# Patient Record
Sex: Male | Born: 1940 | Race: White | Hispanic: No | Marital: Married | Smoking: Never smoker
Health system: Southern US, Community
[De-identification: ages and names within clinical notes are randomized; demographics above are authoritative.]

## PROBLEM LIST (undated history)

## (undated) DIAGNOSIS — I82409 Acute embolism and thrombosis of unspecified deep veins of unspecified lower extremity: Secondary | ICD-10-CM

## (undated) DIAGNOSIS — G4733 Obstructive sleep apnea (adult) (pediatric): Secondary | ICD-10-CM

## (undated) DIAGNOSIS — I10 Essential (primary) hypertension: Secondary | ICD-10-CM

## (undated) DIAGNOSIS — G4730 Sleep apnea, unspecified: Secondary | ICD-10-CM

## (undated) DIAGNOSIS — H269 Unspecified cataract: Secondary | ICD-10-CM

## (undated) DIAGNOSIS — T7840XA Allergy, unspecified, initial encounter: Secondary | ICD-10-CM

## (undated) DIAGNOSIS — E559 Vitamin D deficiency, unspecified: Secondary | ICD-10-CM

## (undated) DIAGNOSIS — N3020 Other chronic cystitis without hematuria: Secondary | ICD-10-CM

## (undated) DIAGNOSIS — R011 Cardiac murmur, unspecified: Secondary | ICD-10-CM

## (undated) DIAGNOSIS — E119 Type 2 diabetes mellitus without complications: Secondary | ICD-10-CM

## (undated) DIAGNOSIS — L570 Actinic keratosis: Secondary | ICD-10-CM

## (undated) DIAGNOSIS — K429 Umbilical hernia without obstruction or gangrene: Secondary | ICD-10-CM

## (undated) DIAGNOSIS — M1990 Unspecified osteoarthritis, unspecified site: Secondary | ICD-10-CM

## (undated) DIAGNOSIS — N400 Enlarged prostate without lower urinary tract symptoms: Secondary | ICD-10-CM

## (undated) DIAGNOSIS — N390 Urinary tract infection, site not specified: Secondary | ICD-10-CM

## (undated) HISTORY — DX: Unspecified cataract: H26.9

## (undated) HISTORY — DX: Sleep apnea, unspecified: G47.30

## (undated) HISTORY — DX: Other chronic cystitis without hematuria: N30.20

## (undated) HISTORY — DX: Benign prostatic hyperplasia without lower urinary tract symptoms: N40.0

## (undated) HISTORY — DX: Vitamin D deficiency, unspecified: E55.9

## (undated) HISTORY — DX: Umbilical hernia without obstruction or gangrene: K42.9

## (undated) HISTORY — DX: Essential (primary) hypertension: I10

## (undated) HISTORY — DX: Obstructive sleep apnea (adult) (pediatric): G47.33

## (undated) HISTORY — DX: Allergy, unspecified, initial encounter: T78.40XA

## (undated) HISTORY — DX: Type 2 diabetes mellitus without complications: E11.9

## (undated) HISTORY — DX: Urinary tract infection, site not specified: N39.0

## (undated) HISTORY — DX: Unspecified osteoarthritis, unspecified site: M19.90

## (undated) HISTORY — DX: Cardiac murmur, unspecified: R01.1

## (undated) HISTORY — DX: Actinic keratosis: L57.0

## (undated) HISTORY — DX: Acute embolism and thrombosis of unspecified deep veins of unspecified lower extremity: I82.409

---

## 1999-04-15 ENCOUNTER — Ambulatory Visit: Admission: RE | Admit: 1999-04-15 | Discharge: 1999-04-15 | Payer: Self-pay | Admitting: Family Medicine

## 2001-06-29 ENCOUNTER — Encounter: Payer: Self-pay | Admitting: Family Medicine

## 2001-06-29 ENCOUNTER — Encounter: Admission: RE | Admit: 2001-06-29 | Discharge: 2001-06-29 | Payer: Self-pay | Admitting: Family Medicine

## 2001-09-07 HISTORY — PX: PROSTATE SURGERY: SHX751

## 2001-10-08 LAB — HM COLONOSCOPY: HM Colonoscopy: NORMAL

## 2001-11-15 ENCOUNTER — Emergency Department (HOSPITAL_COMMUNITY): Admission: EM | Admit: 2001-11-15 | Discharge: 2001-11-15 | Payer: Self-pay | Admitting: Emergency Medicine

## 2001-11-18 ENCOUNTER — Emergency Department (HOSPITAL_COMMUNITY): Admission: EM | Admit: 2001-11-18 | Discharge: 2001-11-18 | Payer: Self-pay | Admitting: Emergency Medicine

## 2001-11-21 ENCOUNTER — Encounter: Payer: Self-pay | Admitting: Urology

## 2001-11-21 ENCOUNTER — Encounter: Admission: RE | Admit: 2001-11-21 | Discharge: 2001-11-21 | Payer: Self-pay | Admitting: Urology

## 2003-09-08 DIAGNOSIS — I82409 Acute embolism and thrombosis of unspecified deep veins of unspecified lower extremity: Secondary | ICD-10-CM

## 2003-09-08 HISTORY — DX: Acute embolism and thrombosis of unspecified deep veins of unspecified lower extremity: I82.409

## 2003-10-01 ENCOUNTER — Encounter: Admission: RE | Admit: 2003-10-01 | Discharge: 2003-10-01 | Payer: Self-pay | Admitting: Family Medicine

## 2003-10-12 ENCOUNTER — Inpatient Hospital Stay (HOSPITAL_COMMUNITY): Admission: EM | Admit: 2003-10-12 | Discharge: 2003-10-14 | Payer: Self-pay | Admitting: Emergency Medicine

## 2003-10-12 ENCOUNTER — Encounter: Admission: RE | Admit: 2003-10-12 | Discharge: 2003-10-12 | Payer: Self-pay | Admitting: Family Medicine

## 2006-05-13 ENCOUNTER — Ambulatory Visit: Payer: Self-pay | Admitting: Family Medicine

## 2006-07-05 ENCOUNTER — Ambulatory Visit: Payer: Self-pay | Admitting: Family Medicine

## 2006-07-21 ENCOUNTER — Ambulatory Visit: Payer: Self-pay | Admitting: Family Medicine

## 2006-10-18 ENCOUNTER — Ambulatory Visit: Payer: Self-pay | Admitting: Family Medicine

## 2006-10-18 LAB — CONVERTED CEMR LAB
BUN: 18 mg/dL (ref 6–23)
CO2: 32 meq/L (ref 19–32)
Calcium: 9.5 mg/dL (ref 8.4–10.5)
Chloride: 98 meq/L (ref 96–112)
Creatinine, Ser: 1.1 mg/dL (ref 0.4–1.5)
GFR calc Af Amer: 86 mL/min
GFR calc non Af Amer: 71 mL/min
Glucose, Bld: 82 mg/dL (ref 70–99)
Potassium: 4.4 meq/L (ref 3.5–5.1)
Sodium: 137 meq/L (ref 135–145)

## 2007-02-21 ENCOUNTER — Ambulatory Visit: Payer: Self-pay | Admitting: Family Medicine

## 2007-02-21 DIAGNOSIS — J309 Allergic rhinitis, unspecified: Secondary | ICD-10-CM | POA: Insufficient documentation

## 2007-02-21 DIAGNOSIS — I10 Essential (primary) hypertension: Secondary | ICD-10-CM | POA: Insufficient documentation

## 2007-02-21 LAB — CONVERTED CEMR LAB
CO2: 30 meq/L (ref 19–32)
Chloride: 107 meq/L (ref 96–112)
Cholesterol: 181 mg/dL (ref 0–200)
Creatinine, Ser: 1 mg/dL (ref 0.4–1.5)
Glucose, Bld: 85 mg/dL (ref 70–99)
HDL: 29.6 mg/dL — ABNORMAL LOW (ref >39.0)
Sodium: 141 meq/L (ref 135–145)
VLDL: 37 mg/dL (ref 0–40)

## 2007-02-22 ENCOUNTER — Telehealth (INDEPENDENT_AMBULATORY_CARE_PROVIDER_SITE_OTHER): Payer: Self-pay | Admitting: *Deleted

## 2007-05-16 ENCOUNTER — Ambulatory Visit: Payer: Self-pay | Admitting: Family Medicine

## 2007-05-19 ENCOUNTER — Encounter (INDEPENDENT_AMBULATORY_CARE_PROVIDER_SITE_OTHER): Payer: Self-pay | Admitting: Family Medicine

## 2007-05-19 LAB — CONVERTED CEMR LAB
BUN: 21 mg/dL (ref 6–23)
Chloride: 107 meq/L (ref 96–112)
Cholesterol: 135 mg/dL (ref 0–200)
Direct LDL: 71.1 mg/dL
GFR calc non Af Amer: 64 mL/min
Sodium: 143 meq/L (ref 135–145)
Total CHOL/HDL Ratio: 5.4
VLDL: 51 mg/dL — ABNORMAL HIGH (ref 0–40)

## 2007-07-28 ENCOUNTER — Ambulatory Visit: Payer: Self-pay | Admitting: Family Medicine

## 2007-09-16 ENCOUNTER — Ambulatory Visit: Payer: Self-pay | Admitting: Family Medicine

## 2007-09-16 LAB — CONVERTED CEMR LAB
Chloride: 102 meq/L (ref 96–112)
GFR calc non Af Amer: 71 mL/min
Potassium: 4.2 meq/L (ref 3.5–5.1)
Sodium: 138 meq/L (ref 135–145)

## 2007-09-19 ENCOUNTER — Encounter (INDEPENDENT_AMBULATORY_CARE_PROVIDER_SITE_OTHER): Payer: Self-pay | Admitting: *Deleted

## 2007-10-17 ENCOUNTER — Ambulatory Visit: Payer: Self-pay | Admitting: Family Medicine

## 2007-11-01 ENCOUNTER — Ambulatory Visit: Payer: Self-pay | Admitting: Family Medicine

## 2007-12-14 ENCOUNTER — Ambulatory Visit: Payer: Self-pay | Admitting: Internal Medicine

## 2008-03-07 ENCOUNTER — Telehealth: Payer: Self-pay | Admitting: Internal Medicine

## 2008-03-07 DIAGNOSIS — G4733 Obstructive sleep apnea (adult) (pediatric): Secondary | ICD-10-CM | POA: Insufficient documentation

## 2008-03-12 ENCOUNTER — Encounter: Payer: Self-pay | Admitting: Internal Medicine

## 2008-04-16 ENCOUNTER — Encounter: Payer: Self-pay | Admitting: Internal Medicine

## 2008-05-18 ENCOUNTER — Ambulatory Visit: Payer: Self-pay | Admitting: Internal Medicine

## 2008-05-22 ENCOUNTER — Telehealth: Payer: Self-pay | Admitting: Internal Medicine

## 2008-05-22 ENCOUNTER — Telehealth (INDEPENDENT_AMBULATORY_CARE_PROVIDER_SITE_OTHER): Payer: Self-pay | Admitting: *Deleted

## 2008-06-05 ENCOUNTER — Ambulatory Visit: Payer: Self-pay | Admitting: Pulmonary Disease

## 2008-08-11 ENCOUNTER — Encounter: Payer: Self-pay | Admitting: Pulmonary Disease

## 2008-08-21 ENCOUNTER — Ambulatory Visit: Payer: Self-pay | Admitting: Pulmonary Disease

## 2008-08-29 ENCOUNTER — Encounter: Payer: Self-pay | Admitting: Internal Medicine

## 2008-09-17 ENCOUNTER — Encounter: Payer: Self-pay | Admitting: Pulmonary Disease

## 2008-10-08 ENCOUNTER — Telehealth: Payer: Self-pay | Admitting: Internal Medicine

## 2008-10-17 ENCOUNTER — Telehealth (INDEPENDENT_AMBULATORY_CARE_PROVIDER_SITE_OTHER): Payer: Self-pay | Admitting: *Deleted

## 2008-11-16 ENCOUNTER — Ambulatory Visit: Payer: Self-pay | Admitting: Internal Medicine

## 2008-11-16 DIAGNOSIS — E785 Hyperlipidemia, unspecified: Secondary | ICD-10-CM | POA: Insufficient documentation

## 2008-11-16 DIAGNOSIS — E781 Pure hyperglyceridemia: Secondary | ICD-10-CM | POA: Insufficient documentation

## 2008-11-17 LAB — CONVERTED CEMR LAB
Cholesterol: 151 mg/dL (ref 0–200)
HDL: 23.3 mg/dL — ABNORMAL LOW (ref >39.0)
VLDL: 33 mg/dL (ref 0–40)

## 2008-11-19 ENCOUNTER — Encounter (INDEPENDENT_AMBULATORY_CARE_PROVIDER_SITE_OTHER): Payer: Self-pay | Admitting: *Deleted

## 2008-12-11 ENCOUNTER — Encounter: Payer: Self-pay | Admitting: Internal Medicine

## 2008-12-14 ENCOUNTER — Telehealth (INDEPENDENT_AMBULATORY_CARE_PROVIDER_SITE_OTHER): Payer: Self-pay | Admitting: *Deleted

## 2008-12-27 ENCOUNTER — Encounter: Payer: Self-pay | Admitting: Pulmonary Disease

## 2009-04-22 ENCOUNTER — Ambulatory Visit: Payer: Self-pay | Admitting: Internal Medicine

## 2009-04-22 LAB — CONVERTED CEMR LAB
Glucose, Urine, Semiquant: NEGATIVE
Nitrite: POSITIVE
Specific Gravity, Urine: 1.015

## 2009-04-23 ENCOUNTER — Encounter: Payer: Self-pay | Admitting: Internal Medicine

## 2009-04-26 ENCOUNTER — Telehealth (INDEPENDENT_AMBULATORY_CARE_PROVIDER_SITE_OTHER): Payer: Self-pay | Admitting: *Deleted

## 2009-05-07 ENCOUNTER — Encounter: Payer: Self-pay | Admitting: Internal Medicine

## 2009-05-24 ENCOUNTER — Ambulatory Visit: Payer: Self-pay | Admitting: Internal Medicine

## 2009-05-24 LAB — CONVERTED CEMR LAB: Vit D, 25-Hydroxy: 58 ng/mL (ref 30–89)

## 2009-05-29 ENCOUNTER — Encounter (INDEPENDENT_AMBULATORY_CARE_PROVIDER_SITE_OTHER): Payer: Self-pay | Admitting: *Deleted

## 2009-05-29 LAB — CONVERTED CEMR LAB
Basophils Absolute: 0 10*3/uL (ref 0.0–0.1)
CO2: 30 meq/L (ref 19–32)
Calcium: 9.3 mg/dL (ref 8.4–10.5)
Chloride: 106 meq/L (ref 96–112)
Eosinophils Absolute: 0.1 10*3/uL (ref 0.0–0.7)
Glucose, Bld: 100 mg/dL — ABNORMAL HIGH (ref 70–99)
HCT: 40.4 % (ref 39.0–52.0)
Hemoglobin: 13.6 g/dL (ref 13.0–17.0)
LDL Cholesterol: 93 mg/dL (ref 0–99)
Lymphocytes Relative: 33.9 % (ref 12.0–46.0)
Lymphs Abs: 1.7 10*3/uL (ref 0.7–4.0)
MCHC: 33.5 g/dL (ref 30.0–36.0)
MCV: 91.7 fL (ref 78.0–100.0)
Monocytes Absolute: 0.3 10*3/uL (ref 0.1–1.0)
Neutro Abs: 3 10*3/uL (ref 1.4–7.7)
RDW: 13.1 % (ref 11.5–14.6)
Sodium: 141 meq/L (ref 135–145)
Total CHOL/HDL Ratio: 5

## 2009-06-10 ENCOUNTER — Encounter (INDEPENDENT_AMBULATORY_CARE_PROVIDER_SITE_OTHER): Payer: Self-pay | Admitting: *Deleted

## 2009-06-10 ENCOUNTER — Ambulatory Visit: Payer: Self-pay | Admitting: Internal Medicine

## 2009-06-13 ENCOUNTER — Encounter (INDEPENDENT_AMBULATORY_CARE_PROVIDER_SITE_OTHER): Payer: Self-pay | Admitting: *Deleted

## 2009-06-13 LAB — CONVERTED CEMR LAB: Fecal Occult Bld: NEGATIVE

## 2009-10-30 ENCOUNTER — Encounter: Payer: Self-pay | Admitting: Internal Medicine

## 2010-01-01 ENCOUNTER — Ambulatory Visit: Payer: Self-pay | Admitting: Internal Medicine

## 2010-05-26 ENCOUNTER — Ambulatory Visit: Payer: Self-pay | Admitting: Internal Medicine

## 2010-05-27 ENCOUNTER — Encounter (INDEPENDENT_AMBULATORY_CARE_PROVIDER_SITE_OTHER): Payer: Self-pay | Admitting: *Deleted

## 2010-05-28 LAB — CONVERTED CEMR LAB
AST: 20 units/L (ref 0–37)
BUN: 21 mg/dL (ref 6–23)
Basophils Absolute: 0 10*3/uL (ref 0.0–0.1)
Basophils Relative: 0.6 % (ref 0.0–3.0)
CO2: 27 meq/L (ref 19–32)
Calcium: 8.9 mg/dL (ref 8.4–10.5)
Chloride: 106 meq/L (ref 96–112)
Cholesterol: 140 mg/dL (ref 0–200)
Creatinine, Ser: 1.1 mg/dL (ref 0.4–1.5)
Eosinophils Absolute: 0 10*3/uL (ref 0.0–0.7)
HCT: 39.7 % (ref 39.0–52.0)
Hemoglobin: 13.6 g/dL (ref 13.0–17.0)
Lymphs Abs: 2 10*3/uL (ref 0.7–4.0)
MCHC: 34.3 g/dL (ref 30.0–36.0)
Neutro Abs: 3 10*3/uL (ref 1.4–7.7)
RBC: 4.35 M/uL (ref 4.22–5.81)
RDW: 13.4 % (ref 11.5–14.6)
Triglycerides: 126 mg/dL (ref 0.0–149.0)

## 2010-06-02 ENCOUNTER — Ambulatory Visit: Payer: Self-pay | Admitting: Internal Medicine

## 2010-10-05 LAB — CONVERTED CEMR LAB
Basophils Absolute: 0 10*3/uL (ref 0.0–0.1)
Bilirubin, Direct: 0.1 mg/dL (ref 0.0–0.3)
Calcium: 9.2 mg/dL (ref 8.4–10.5)
Cholesterol: 145 mg/dL (ref 0–200)
Eosinophils Absolute: 0 10*3/uL (ref 0.0–0.7)
GFR calc Af Amer: 78 mL/min
HCT: 40.8 % (ref 39.0–52.0)
Hemoglobin: 14.3 g/dL (ref 13.0–17.0)
MCHC: 35 g/dL (ref 30.0–36.0)
Monocytes Absolute: 0.4 10*3/uL (ref 0.1–1.0)
Neutro Abs: 3.2 10*3/uL (ref 1.4–7.7)
RDW: 13.3 % (ref 11.5–14.6)
Sodium: 140 meq/L (ref 135–145)
TSH: 1.4 microintl units/mL (ref 0.35–5.50)
Total Bilirubin: 0.6 mg/dL (ref 0.3–1.2)
VLDL: 79 mg/dL — ABNORMAL HIGH (ref 0–40)

## 2010-10-07 NOTE — Letter (Signed)
Summary: [facility]  [facility]   Imported By: Lanelle Bal 11/11/2009 10:35:40  _____________________________________________________________________  External Attachment:    Type:   Image     Comment:   External Document

## 2010-10-07 NOTE — Letter (Signed)
Summary: [facility]: Immunoassay Fecal Occult Blood (iFOB) Order Form  [facility] [place]  [address] [place], [place] [address]   Phone: [phone]  Fax: [phone]      [facility]: Immunoassay Fecal Occult Blood (iFOB) Order Form   May 27, 2010 MRN: [id number]   Timothy Stone [birth year]   Physicican Name:____jose paz,md_____________________  Diagnosis Code:_____v76.51_____________________      Army Fossa CMA

## 2010-10-07 NOTE — Assessment & Plan Note (Signed)
Summary: emp//pt will be fasting//lch   Vital Signs:  Patient profile:   70 year old male Weight:      201 pounds Pulse rate:   53 / minute Pulse rhythm:   regular BP sitting:   118 / 78  (left arm) Cuff size:   large  Vitals Entered By: Army Fossa CMA (May 26, 2010 8:29 AM) CC: CPX- fasting Comments flu shot does not need any refills   History of Present Illness: Here for Medicare AWV:  1.   Risk factors based on Past M, S, F history: yes  2.   Physical Activities: yard work, goes to [employer] from time to time, takes walks  3.   Depression/mood: denies , doing well , looks well  4.   Hearing: no reported problems  5.   ADL's: TOTALLY INDEPENDENT  6.   Fall Risk: low risk  7.   Home Safety: feels safe at home  8.   Height, weight, &visual acuity: see VS, has glasses , sees eye doctor routinely  9.   Counseling: yes , see below  10.   Labs ordered based on risk factors: yes 11.           Referral Coordination: If needed 12.           Care Plan: see a/p  13.            Cognitive Assessment : motor skills, memory and cognition seems appropriate  in addition, we discussed the following issues recurrent UTI decreased since he uses daily abx  HYPERTENSION -- ambulatory BPs wnl, checked daily  OSA-- good   compliance w/ CPAP    Preventive Screening-Counseling & Management  Caffeine-Diet-Exercise     Times/week: 7  Current Medications (verified): 1)  Bayer Low Strength 81 Mg Tbec (Aspirin) .... Once Daily 2)  Cardura 4 Mg Tabs (Doxazosin Mesylate) .... Take 1 Tablet At Bedtime 3)  Ziac 5-6.25 Mg Tabs (Bisoprolol-Hydrochlorothiazide) .... Take 1 Tablet By Mouth Once A Day 4)  Claritin 10 Mg  Tabs (Loratadine) .Marland Kitchen.. 1 As Needed 5)  Flonase 50 Mcg/act Susp (Fluticasone Propionate) .... 2 Sprays Each Day 6)  Bl Ibuprofen 200 Mg  Tabs (Ibuprofen) .... As Needed 7)  Vitamin E 400 Unit Caps (Vitamin E) .... Take 1 Tablet By Mouth Once A Day 8)  Tylenol 500mg  .... As  Needed 9)  Fish Oil 1000 Mg Caps (Omega-3 Fatty Acids) .Marland Kitchen.. 1 Once Daily 10)  Cpap 13 .... At Bedtime 11)  Vitamin D3 10000 Unit Caps (Cholecalciferol) .... Take 1 Tablet By Mouth Once A Day 12)  Nitrofurantoin Monohyd Macro 100 Mg Caps (Nitrofurantoin Monohyd Macro) .... Per Urology 13)  Oscal 500/200 D-3 500-200 Mg-Unit Tabs (Calcium-Vitamin D) .... Qd 14)  Citracal Plus  Tabs (Multiple Minerals-Vitamins) .... Qd 15)  Mvi  Allergies (verified): No Known Drug Allergies  Past History:  Past Medical History: Reviewed history from 01/01/2010 and no changes required. Allergic rhinitis UMBILICAL HERNIA, no surgery DVT -- aprox 2005 (no know triggers per pt) BPH-- s/p TUNA .Marland Kitchen..sees urology HYPERTENSION Dx in the 70s OSA--CPAP hyperplastic aktinic keratosis forehead s/p surgery Dr Park Liter  Past Surgical History: Reviewed history from 05/18/2008 and no changes required. TUNA procedure  Family History: Reviewed history from 05/24/2009 and no changes required. DM-- ? MI-- F age mid 48s colon ca--no prostate ca--no  breast cancer-- sister rectal cancer-- siter dx 10 y/o @ time of her first Cscope   Social History: Reviewed history  from 01/01/2010 and no changes required. 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 CV:  Denies chest pain or discomfort, palpitations, and swelling of feet. Resp:  Denies cough and wheezing. GI:  Denies bloody stools, nausea, and vomiting. GU:  Denies dysuria, urinary frequency, and urinary hesitancy. Derm:  has askin lesion in the fifth left toe, getting better, smaller, not dark.  Physical Exam  General:  alert and well-developed.   Neck:  no masses, no thyromegaly, and normal carotid upstroke.   Lungs:  normal  respiratory effort, no intercostal retractions, no accessory muscle use, and normal breath sounds.   Heart:  normal rate, regular rhythm, no murmur, and no gallop.   Abdomen:  soft, non-tender, no distention, no masses, no guarding, and no rigidity.   + reducible umbilical hernia, aprox 1.5 inches Extremities:  no  lower extremity edema Skin:  fifth left toe essentially normal, close to the nail there is a 1 mm place that seems to slightly larger, not hard, not hyperpigmented Psych:  Cognition and judgment appear intact. Alert and cooperative with normal attention span and concentration.  not anxious appearing and not depressed appearing.     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 Take 1 tablet at bedtime    Ziac 5-6.25 Mg  Tabs (Bisoprolol-hydrochlorothiazide) .Marland Kitchen... Take 1 tablet by mouth once a day  BP today: 118/78 Prior BP: 120/80 (01/01/2010)  Labs Reviewed: K+: 4.9 (05/24/2009) Creat: : 1.3 (05/24/2009)   Chol: 138 (05/24/2009)   HDL: 25.30 (05/24/2009)   LDL: 93 (05/24/2009)   TG: 98.0 (05/24/2009)  Orders: Venipuncture (22025) TLB-CBC Platelet - w/Differential (85025-CBCD) TLB-BMP (Basic Metabolic Panel-BMET) (80048-METABOL) Specimen Handling (42706)  Complete Medication List: 1)  Bayer Low Strength 81 Mg Tbec (Aspirin) .... Once daily 2)  Cardura 4 Mg Tabs (Doxazosin mesylate) .... Take 1 tablet at bedtime 3)  Ziac 5-6.25 Mg Tabs (Bisoprolol-hydrochlorothiazide) .... Take 1 tablet by mouth once a day 4)  Claritin 10 Mg Tabs (Loratadine) .Marland Kitchen.. 1 as needed 5)  Flonase 50 Mcg/act Susp (Fluticasone propionate) .... 2 sprays each day 6)  Bl Ibuprofen 200 Mg Tabs (Ibuprofen) .... As needed 7)  Vitamin E 400 Unit Caps (  Vitamin e) .... Take 1 tablet by mouth once a day 8)  Tylenol 500mg   .... As needed 9)  Fish Oil 1000 Mg Caps (Omega-3 fatty acids) .Marland Kitchen.. 1 once daily 10)  Cpap 13  .... At bedtime 11)  Vitamin D3 10000 Unit Caps (Cholecalciferol) .... Take 1 tablet by mouth once a day 12)  Nitrofurantoin Monohyd Macro 100 Mg Caps (Nitrofurantoin monohyd macro) .... Per urology 13)  Oscal 500/200 D-3 500-200 Mg-unit Tabs (Calcium-vitamin d) .... Qd 14)  Citracal Plus Tabs (Multiple minerals-vitamins) .... Qd 15)  Mvi   Other Orders: Flu Vaccine 17yrs + (69629) Administration Flu vaccine - MCR (G0008) Zoster (Shingles) Vaccine Live (52841) Admin 1st Vaccine (32440) Admin of Any Addtl Vaccine (10272)  Patient Instructions: 1)  Please schedule a follow-up appointment in 6 to 8 months .    Risk Factors:  Exercise:  yes    Times per week:  7 Flu Vaccine Consent Questions     Do you have a history of severe allergic reactions to this vaccine? no    Any prior history of allergic reactions to egg  and/or gelatin? no    Do you have a sensitivity to the preservative Thimersol? no    Do you have a past history of Guillan-Barre Syndrome? no    Do you currently have an acute febrile illness? no    Have you ever had a severe reaction to latex? no    Vaccine information given and explained to patient? yes    Are you currently pregnant? no    Lot Number:[id number]   Exp Date:03/06/2010   Site Given  Left Deltoid IM yes    Times per week:  7    .lbmedflu   Immunizations Administered:  Zostavax # 1:    Vaccine Type: Zostavax    Site: right deltoid    Mfr: Merck    Dose: 0.5 ml    Route: [place]    Given by: Army Fossa CMA    Exp. Date: 04/25/2011    Lot #: [id number]

## 2010-10-07 NOTE — Assessment & Plan Note (Signed)
Summary: 6 MTH FU/NS/KDC   Vital Signs:  Patient profile:   70 year old male Height:      67 inches Weight:      190.2 pounds BMI:     29.90 Pulse rate:   70 / minute BP sitting:   120 / 80  Vitals Entered By: Shary Decamp (January 01, 2010 12:42 PM) CC: rov   History of Present Illness: ROV frecuent UTIs,  saw urology, Rx abx daily for prevention Allergic rhinitis-- symptoms well controlled  HYPERTENSION -- check ambulatory BPs daily, usually 120/70s OSA-- good medication compliance  w/ CPAP   Current Medications (verified): 1)  Bayer Low Strength 81 Mg Tbec (Aspirin) .... Once Daily 2)  Cardura 4 Mg Tabs (Doxazosin Mesylate) .... Take 1 Tablet At Bedtime 3)  Ziac 5-6.25 Mg Tabs (Bisoprolol-Hydrochlorothiazide) .... Take 1 Tablet By Mouth Once A Day 4)  Claritin 10 Mg  Tabs (Loratadine) .Marland Kitchen.. 1 As Needed 5)  Flonase 50 Mcg/act Susp (Fluticasone Propionate) .... 2 Sprays Each Day 6)  Bl Ibuprofen 200 Mg  Tabs (Ibuprofen) .... As Needed 7)  Vitamin E 400 Unit Caps (Vitamin E) .... Take 1 Tablet By Mouth Once A Day 8)  Tylenol 500mg  .... As Needed 9)  Fish Oil 1000 Mg Caps (Omega-3 Fatty Acids) .Marland Kitchen.. 1 Once Daily 10)  Cpap 13 .... At Bedtime 11)  Vitamin D3 10000 Unit Caps (Cholecalciferol) .... Take 1 Tablet By Mouth Once A Day 12)  Nitrofurantoin Monohyd Macro 100 Mg Caps (Nitrofurantoin Monohyd Macro) .... Per Urology  Allergies (verified): No Known Drug Allergies  Past History:  Past Medical History: Allergic rhinitis UMBILICAL HERNIA, no surgery DVT -- aprox 2005 (no know triggers per pt) BPH-- s/p TUNA .Marland Kitchen..sees urology HYPERTENSION Dx in the 70s OSA--CPAP hyperplastic aktinic keratosis forehead s/p surgery Dr Park Liter  Past Surgical History: Reviewed history from 05/18/2008 and no changes required. 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 Resp:  Denies cough and shortness of breath.  Physical Exam  General:  alert, well-developed, and well-nourished.   Lungs:  normal respiratory effort, no intercostal retractions, no accessory muscle use, and normal breath sounds.   Heart:  normal rate, regular rhythm, no murmur, and no gallop.   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 Take 1 tablet at bedtime    Ziac 5-6.25 Mg Tabs (Bisoprolol-hydrochlorothiazide) .Marland Kitchen... Take 1 tablet by mouth once a day  Complete Medication List: 1)  Bayer Low Strength 81 Mg Tbec (Aspirin) .... Once daily 2)  Cardura 4 Mg Tabs (Doxazosin mesylate) .... Take 1 tablet at bedtime 3)  Ziac 5-6.25 Mg Tabs (  Bisoprolol-hydrochlorothiazide) .... Take 1 tablet by mouth once a day 4)  Claritin 10 Mg Tabs (Loratadine) .Marland Kitchen.. 1 as needed 5)  Flonase 50 Mcg/act Susp (Fluticasone propionate) .... 2 sprays each day 6)  Bl Ibuprofen 200 Mg Tabs (Ibuprofen) .... As needed 7)  Vitamin E 400 Unit Caps (Vitamin e) .... Take 1 tablet by mouth once a day 8)  Tylenol 500mg   .... As needed 9)  Fish Oil 1000 Mg Caps (Omega-3  fatty acids) .Marland Kitchen.. 1 once daily 10)  Cpap 13  .... At bedtime 11)  Vitamin D3 10000 Unit Caps (Cholecalciferol) .... Take 1 tablet by mouth once a day 12)  Nitrofurantoin Monohyd Macro 100 Mg Caps (Nitrofurantoin monohyd macro) .... Per urology  Patient Instructions: 1)  Please schedule a follow-up appointment in 5 months (fasting, yearly) Prescriptions: FLONASE 50 MCG/ACT SUSP (FLUTICASONE PROPIONATE) 2 sprays each day  #3 x 3   Entered by:   Shary Decamp   Authorized by:   Nolon Rod. Janaki Exley MD   Signed by:   Shary Decamp on 01/01/2010   Method used:   Faxed to ...       [facility] (mail-order)       [address] [place], [place]  [address]       Ph: [phone]       Fax: [phone]   RxID:   [id number] ZIAC 5-6.25 MG TABS (BISOPROLOL-HYDROCHLOROTHIAZIDE) Take 1 tablet by mouth once a day  #90 x 3   Entered by:   Shary Decamp   Authorized by:   Nolon Rod. Casey Fye MD   Signed by:   Shary Decamp on 01/01/2010   Method used:   Faxed to ...       [facility] (mail-order)       [address] [place], [place]  [address]       Ph: [phone]       Fax: [phone]   RxID:   [id number] CARDURA 4 MG TABS (DOXAZOSIN MESYLATE) Take 1 tablet at bedtime  #90 x 3   Entered by:   Shary Decamp   Authorized by:   Nolon Rod. Jeston Junkins MD   Signed by:   Shary Decamp on 01/01/2010   Method used:   Faxed to ...       [facility] (mail[facility]order)       [address] [place], [place]  [address]       Ph: [phone]       Fax: [phone]   RxID:   [id number]

## 2010-10-29 ENCOUNTER — Encounter: Payer: Self-pay | Admitting: Internal Medicine

## 2010-11-01 ENCOUNTER — Encounter: Payer: Self-pay | Admitting: Internal Medicine

## 2010-11-18 NOTE — Letter (Signed)
Summary: [facility]  [facility]   Imported By: Lanelle Bal 11/05/2010 09:09:41  _____________________________________________________________________  External Attachment:    Type:   Image     Comment:   External Document

## 2010-12-22 ENCOUNTER — Ambulatory Visit (INDEPENDENT_AMBULATORY_CARE_PROVIDER_SITE_OTHER): Payer: BC Managed Care – PPO | Admitting: Internal Medicine

## 2010-12-22 ENCOUNTER — Encounter: Payer: Self-pay | Admitting: Internal Medicine

## 2010-12-22 DIAGNOSIS — G4733 Obstructive sleep apnea (adult) (pediatric): Secondary | ICD-10-CM

## 2010-12-22 DIAGNOSIS — I10 Essential (primary) hypertension: Secondary | ICD-10-CM

## 2010-12-22 DIAGNOSIS — N390 Urinary tract infection, site not specified: Secondary | ICD-10-CM

## 2010-12-22 DIAGNOSIS — N400 Enlarged prostate without lower urinary tract symptoms: Secondary | ICD-10-CM

## 2010-12-22 DIAGNOSIS — J309 Allergic rhinitis, unspecified: Secondary | ICD-10-CM

## 2010-12-22 DIAGNOSIS — E781 Pure hyperglyceridemia: Secondary | ICD-10-CM

## 2010-12-22 NOTE — Assessment & Plan Note (Signed)
Sx relatively controlled on nasal steroids, declined more meds

## 2010-12-22 NOTE — Progress Notes (Signed)
  Subjective:    Patient ID: Timothy Stone, male    DOB: 07-22-1941, 70 y.o.   MRN: [id number]  HPI H/o recurrent UTI, dx w/ ptostatitis 2 months ago, sx resolved  OSA good compliance w/ CPAP Past Medical History  Diagnosis Date  . Umbilical hernia     no surgery  . DVT (deep venous thrombosis) 2995    no know triggers per pt  . BPH (benign prostatic hyperplasia)     s/p TUNA  . HTN (hypertension)     dx in the 70s  . OSA (obstructive sleep apnea)     on CPAP  . Recurrent UTI    Past Surgical History  Procedure Date  . Prostate surgery     TUNA     Review of Systems  Constitutional:       Diet - exercise "OK" , room for improvment  Respiratory: Negative for shortness of breath.   Cardiovascular: Negative for chest pain and leg swelling.  Genitourinary:       No dysuria or hematuria        Objective:   Physical Exam  Constitutional: He appears well-developed and well-nourished.  Cardiovascular: Normal rate, regular rhythm and normal heart sounds.   No murmur heard. Pulmonary/Chest: No respiratory distress. He has no wheezes. He has no rales.  Musculoskeletal: He exhibits no edema.  Psychiatric: He has a normal mood and affect. His behavior is normal. Judgment and thought content normal.          Assessment & Plan:

## 2010-12-22 NOTE — Assessment & Plan Note (Signed)
Well-controlled, no change 

## 2010-12-22 NOTE — Assessment & Plan Note (Signed)
On diet only, last results very good Discussed diet

## 2010-12-22 NOTE — Assessment & Plan Note (Signed)
Good compliance w CPAP 

## 2011-01-13 ENCOUNTER — Other Ambulatory Visit: Payer: Self-pay | Admitting: *Deleted

## 2011-01-13 MED ORDER — DOXAZOSIN MESYLATE 4 MG PO TABS: 4.0000 mg | ORAL_TABLET | Freq: Every day | ORAL | Status: DC

## 2011-01-13 MED ORDER — BISOPROLOL-HYDROCHLOROTHIAZIDE 2.5-6.25 MG PO TABS: 1.0000 | ORAL_TABLET | Freq: Every day | ORAL | Status: DC

## 2011-01-23 NOTE — H&P (Signed)
NAME:  Parkhill, EMMITTE SURGEON                          ACCOUNT NO.:  [id number]   MEDICAL RECORD NO.:  [id number]                   PATIENT TYPE:  INP   LOCATION:  0376                                 FACILITY:  [facility]   PHYSICIAN:  Sherin Quarry, MD                   DATE OF BIRTH:  Apr 14, 1941   DATE OF ADMISSION:  10/12/2003  DATE OF DISCHARGE:                                HISTORY & PHYSICAL   Harbor Vanover is a 70 year old man who is generally in excellent health.  About 10 days ago, he developed pain in the left ankle, which his wife  states became somewhat swollen.  The pain subsequently spread to the left  calf area.  There has been no associated chest pain, shortness of breath,  palpitations, fever, chills, or gastrointestinal symptoms.  Since that time,  the patient has had serial ultrasound studies of the left lower extremity,  which have shown deep venous thrombophlebitis involving the calf veins,  which has been documented to propagate into the popliteal vein.  Because of  this documentation of propagation, the patient is admitted at this time for  heparin and Coumadin therapy.  He is otherwise feeling well.   ALLERGIES:  No known drug allergies.   MEDICATIONS:  1. Ziac 2.5 mg daily.  2. Cardura 4 mg daily.  3. Aspirin 1 daily.  4. Tylenol #3 p.r.n. pain.   PAST MEDICAL HISTORY:  1. Hypertension:  Patient reports he has a 20-year history of hypertension.     He indicates that this has been well regulated.  There has been no     history of congestive heart failure or stroke.  2. Chronic headache:  The patient states that for many years he was troubled     by chronic headaches.  He eventually figured out that the headaches were     actually being caused by taking an extensive amount of Excedrin and     experiencing caffeine withdrawal.  Since stopping the Excedrin, the     headaches have gone away.  The patient indicates that he has also been     having some problems with  urinary hesitancy and frequency, but these     symptoms seemed to have responded to a TUNA procedure.   OPERATIONS:  The patient has had a previous TUNA procedure.  He has also had  a colonoscopy.   FAMILY HISTORY:  His father died of heart disease in his mid 74s, and mother  died of cirrhosis of the liver.  His sister died of breast cancer.  He has a  brother who died of bone cancer.   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  He is not very physically active.  He does not smoke.  He denies the  use of alcohol or drugs.   REVIEW OF SYSTEMS:  Head:  He denies headache or dizziness.  Eyes:  He  denies visual blurring or diplopia.  ENT:  He denies earache, sinus pain or  sore throat.  CHEST:  Denies coughing, wheezing, or chest congestion.  CARDIOVASCULAR:  Denies orthopnea, PND, or ankle edema.  GI:  Denies nausea,  vomiting, abdominal pain, change in bowel habits, melena, or hematochezia.  GU:  He denies dysuria, urinary frequency, hesitancy, or nocturia.  RHEUMATOLOGIC:  See above.  HEMATOLOGIC:  There is no history of easy  bleeding or bruising.   PHYSICAL EXAMINATION:  VITAL SIGNS:  His blood pressure is currently  156/100.  Pulse is 84.  Respirations 16.  HEENT:  Within normal limits.  LUNGS:  Chest is clear to auscultation and percussion.  BACK:  No CVA or point tenderness.  CARDIOVASCULAR:  Normal S1 and S2 without murmurs, rubs or gallops.  ABDOMEN:  Benign.  Normal bowel sounds without masses, tenderness, or  organomegaly.  EXTREMITIES:  Reveals 2+ femoral, popliteal, DP/PT pulses.  There is no  swelling or redness.  Venous Doppler, as previously noted, shows propagation  and increased thrombosis in popliteal region when compared on serial  studies.  NEUROLOGIC:  Cranial nerves, motor, sensory, and cerebellar testing are  normal.  Station and gait are normal.   White count 6400, hemoglobin 13.7, INR 1.0.   IMPRESSION:  1.  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  In my opinion, this constitutes an indication for     initiating Lovenox and Coumadin therapy.  2. Hypertension.  3. History of chronic headaches, resolved.  4. Status post TUNA procedure.   Patient will be admitted to the hospital at this time for initiation of  Lovenox and Coumadin therapy.  We will continue his blood pressure  medication.  We will have the care manager explore the possibility of  completing his treatment as an outpatient.                                               Sherin Quarry, MD    SY/MEDQ  D:  10/12/2003  T:  10/12/2003  Job:  [id number]   cc:   Leanne Chang, M.D.  [address]  [place]  [place] [address]  Fax: [phone]

## 2011-02-03 ENCOUNTER — Ambulatory Visit (INDEPENDENT_AMBULATORY_CARE_PROVIDER_SITE_OTHER)
Admission: RE | Admit: 2011-02-03 | Discharge: 2011-02-03 | Disposition: A | Discharge status: Home / Self Care | Payer: BC Managed Care – PPO | Source: Ambulatory Visit | Attending: Internal Medicine | Admitting: Internal Medicine

## 2011-02-03 ENCOUNTER — Ambulatory Visit (INDEPENDENT_AMBULATORY_CARE_PROVIDER_SITE_OTHER): Payer: BC Managed Care – PPO | Admitting: Internal Medicine

## 2011-02-03 ENCOUNTER — Encounter: Payer: Self-pay | Admitting: Internal Medicine

## 2011-02-03 VITALS — BP 148/86 | HR 54 | Wt 213.0 lb

## 2011-02-03 DIAGNOSIS — S6990XA Unspecified injury of unspecified wrist, hand and finger(s), initial encounter: Secondary | ICD-10-CM

## 2011-02-03 NOTE — Progress Notes (Signed)
  Subjective:    Patient ID: Timothy Stone, male    DOB: [birth year], 70 y.o.   MRN: [id number]  HPI 6 days ago, at home, tripped and fell. Landed on his palms, hit the L side of the head, hurt his L arm and L leg. Did no seek medical attention until today. C/o pain and swelling at the left hand and wrist since then. Most of the pain is concentrated in the second knuckle. Past Medical History  Diagnosis Date  . Umbilical hernia     no surgery  . DVT (deep venous thrombosis) 2995    no know triggers per pt  . BPH (benign prostatic hyperplasia)     s/p TUNA  . HTN (hypertension)     dx in the 70s  . OSA (obstructive sleep apnea)     on CPAP  . Recurrent UTI    Past Surgical History  Procedure Date  . Prostate surgery     TUNA      Review of Systems Denies any syncope, neck pain. No loss of consciousness. He did hit his head at the time of the fall but currently is not hurting there      Objective:   Physical Exam Alert oriented in no apparent distress Right wrist and hand normal Left wrist and hand: Swollen throughout, range of motion of the wrist is a slightly decreased, no deformities noted, is mildly  tender on the knuckles except on the fourth knuckle where he is obviously hurting. Vascular exam is normal. Motor exam is normal       Assessment & Plan:

## 2011-02-03 NOTE — Patient Instructions (Signed)
Ibuprofen Ace wrap Call if no better in few days

## 2011-02-03 NOTE — Assessment & Plan Note (Signed)
Hand-wrist  injury 6 days ago with ongoing swelling and pain. Suspect fracture at one of the metacarpal and ?wrist X-rays, ibuprofen, Ace wrap. Further advice for results. If x-ray negative and he has ongoing symptoms, likely will need ortho referral , MRI

## 2011-02-04 ENCOUNTER — Telehealth: Payer: Self-pay | Admitting: *Deleted

## 2011-02-04 NOTE — Telephone Encounter (Signed)
Message copied by Leanne Lovely on Wed Feb 04, 2011 10:50 AM ------      Message from: Timothy Stone      Created: Tue Feb 03, 2011  6:07 PM       Advise patient:      No fractures  per XRs of hand and wrist      Rec to use an ace wrap, ice, motrin.      If not improving by the end of the week, needs to call for a ortho referal

## 2011-02-04 NOTE — Telephone Encounter (Signed)
Message left for patient to return my call.  

## 2011-02-04 NOTE — Telephone Encounter (Signed)
Pt is aware.  

## 2011-04-08 ENCOUNTER — Other Ambulatory Visit: Payer: Self-pay | Admitting: Internal Medicine

## 2011-04-08 MED ORDER — FLUTICASONE PROPIONATE 50 MCG/ACT NA SUSP: 2.0000 | Freq: Every day | NASAL | Status: DC

## 2011-06-23 ENCOUNTER — Encounter: Payer: BC Managed Care – PPO | Admitting: Internal Medicine

## 2011-08-10 ENCOUNTER — Encounter: Payer: Self-pay | Admitting: Internal Medicine

## 2011-08-10 ENCOUNTER — Ambulatory Visit (INDEPENDENT_AMBULATORY_CARE_PROVIDER_SITE_OTHER): Payer: BC Managed Care – PPO | Admitting: Internal Medicine

## 2011-08-10 DIAGNOSIS — N390 Urinary tract infection, site not specified: Secondary | ICD-10-CM

## 2011-08-10 DIAGNOSIS — Z0000 Encounter for general adult medical examination without abnormal findings: Secondary | ICD-10-CM | POA: Insufficient documentation

## 2011-08-10 DIAGNOSIS — E781 Pure hyperglyceridemia: Secondary | ICD-10-CM

## 2011-08-10 DIAGNOSIS — I10 Essential (primary) hypertension: Secondary | ICD-10-CM

## 2011-08-10 LAB — COMPREHENSIVE METABOLIC PANEL
ALT: 24 U/L (ref 0–53)
CO2: 26 mEq/L (ref 19–32)
Calcium: 9.2 mg/dL (ref 8.4–10.5)
Chloride: 105 mEq/L (ref 96–112)
Creatinine, Ser: 1.1 mg/dL (ref 0.4–1.5)
GFR: 68.66 mL/min (ref >60.00)
Sodium: 140 mEq/L (ref 135–145)
Total Protein: 7.5 g/dL (ref 6.0–8.3)

## 2011-08-10 LAB — CBC WITH DIFFERENTIAL/PLATELET
Basophils Relative: 0.7 % (ref 0.0–3.0)
Eosinophils Absolute: 0 10*3/uL (ref 0.0–0.7)
MCHC: 34.7 g/dL (ref 30.0–36.0)
MCV: 89.1 fl (ref 78.0–100.0)
Monocytes Absolute: 0.3 10*3/uL (ref 0.1–1.0)
Neutrophils Relative %: 61.3 % (ref 43.0–77.0)
Platelets: 225 10*3/uL (ref 150.0–400.0)
RDW: 13.3 % (ref 11.5–14.6)

## 2011-08-10 MED ORDER — FLUTICASONE PROPIONATE 50 MCG/ACT NA SUSP: 2.0000 | Freq: Every day | NASAL | Status: DC

## 2011-08-10 NOTE — Assessment & Plan Note (Signed)
On diet only Labs 

## 2011-08-10 NOTE — Patient Instructions (Signed)
Check the  blood pressure 2 or 3 times a week, be sure it is less than 140/85. If it is consistently higher, let me know  

## 2011-08-10 NOTE — Progress Notes (Signed)
Subjective:    Patient ID: Timothy Stone, male    DOB: April 27, 1941, 70 y.o.   MRN: [id number]  HPI Here for Medicare AWV: 1. Risk factors based on Past M, S, F history: yes  2. Physical Activities: yard work, goes to [employer] from time to time, takes walks  3. Depression/mood: denies , doing well , looks well  4. Hearing: no reported problems, occ tinnitus 5. ADL's: TOTALLY INDEPENDENT  6. Fall Risk: low risk  7. Home Safety: feels safe at home  8. Height, weight, &visual acuity: see VS, has glasses , sees eye doctor routinely  9. Counseling: yes , see below  10. Labs ordered based on risk factors: yes 11.           Referral Coordination: If needed 12.           Care Plan: see a/p  13.            Cognitive Assessment : motor skills, memory and cognition seems appropriate  in addition, we discussed the following issues  recurrent UTI, note from urology reviewed HYPERTENSION -- ambulatory BPs in the morning or at most 140/80, lower readings the rest of the day OSA-- good   compliance w/ CPAP  Complaining of occasional back pain for years, occasional paresthesias in the lateral aspect of the left leg and left plantar area.  Past Medical History  Diagnosis Date  . Umbilical hernia     no surgery  . DVT (deep venous thrombosis) 2005    no know triggers per pt  . BPH (benign prostatic hyperplasia)     s/p TUNA  . HTN (hypertension)     dx in the 70s  . OSA (obstructive sleep apnea)     on CPAP  . Recurrent UTI   . Actinic keratosis     hyperplastic aktinic keratosis forehead s/p surgery Dr Park Liter   Past Surgical History  Procedure Date  . Prostate surgery     TUNA around 2003   History   Social History  . Marital Status: Married    Spouse Name: N/A    Number of Children: 4  . Years of Education: N/A   Occupational History  . 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 Smoking status:  Never Smoker   . Smokeless tobacco: Never Used  . Alcohol Use: Yes     2x per week  . Drug Use: No  . Sexually Active: Not on file   Other Topics Concern  . Not on file   Social History Narrative   Regular exercise- yes walking    Family History  Problem Relation Age of Onset  . Diabetes    . Heart attack Father 54  . Breast cancer Sister   . Rectal cancer Sister 37    @ time of her first Cscope    Review of Systems  Constitutional: Negative for fever and fatigue.  Respiratory: Negative for cough and shortness of breath.   Cardiovascular: Negative for chest pain, palpitations and leg swelling.  Gastrointestinal: Negative for abdominal pain and blood in stool.  Genitourinary: Negative for dysuria, hematuria and difficulty urinating.       Objective:   Physical Exam  Constitutional: He is oriented to person, place, and time. He appears well-developed and well-nourished. No distress.  HENT:  Head: Normocephalic and atraumatic.  Neck: No thyromegaly present.  Normal carotid pulse  Cardiovascular: Normal rate, regular rhythm and normal heart sounds.   No murmur heard. Pulmonary/Chest: Effort normal and breath sounds normal. No respiratory distress. He has no wheezes. He has no rales.  Abdominal: Soft. Bowel sounds are normal. He exhibits no distension. There is no tenderness. There is no rebound and no guarding.       Reducible, approximately 1 inch umbilical hernia.  Musculoskeletal: He exhibits no edema.  Neurological: He is alert and oriented to person, place, and time.       Strength and reflexes lower extremity symmetric. Pinprick examination of the feet normal.  Straight leg test negative  Skin: He is not diaphoretic.  Psychiatric: He has a normal mood and affect. His behavior is normal. Judgment and thought content normal.       Assessment & Plan:

## 2011-08-10 NOTE — Assessment & Plan Note (Signed)
Per u rology 

## 2011-08-10 NOTE — Assessment & Plan Note (Addendum)
Td 05 pneumonia shot 2009 Had a flu shot at work Shingles immunization --2011  per pt had a Cscope 2003 in [place], sister dx w/ rectal cancer recently; iFOB neg 05-2010 next Cscope 2013  iFOB provided  DRE PSA per urology  Diet-exercise discussed; rec against high Vit E doses  Also has paresthesias L leg, neuro exam neg, rec observation

## 2011-08-10 NOTE — Assessment & Plan Note (Signed)
BP in AM in the 140/80, better rest of the day Plan: no change, see instructions, labs

## 2011-08-13 ENCOUNTER — Encounter: Payer: Self-pay | Admitting: Internal Medicine

## 2011-08-13 LAB — HEMOGLOBIN A1C: Hgb A1c MFr Bld: 5.9 % (ref 4.6–6.5)

## 2011-10-05 ENCOUNTER — Other Ambulatory Visit: Payer: BC Managed Care – PPO

## 2011-10-05 DIAGNOSIS — Z5181 Encounter for therapeutic drug level monitoring: Secondary | ICD-10-CM

## 2011-10-05 LAB — FECAL OCCULT BLOOD, IMMUNOCHEMICAL: Fecal Occult Bld: NEGATIVE

## 2011-10-08 ENCOUNTER — Encounter: Payer: Self-pay | Admitting: *Deleted

## 2011-10-08 ENCOUNTER — Encounter: Payer: Self-pay | Admitting: Internal Medicine

## 2011-11-06 ENCOUNTER — Encounter: Payer: Self-pay | Admitting: Family Medicine

## 2011-11-06 ENCOUNTER — Ambulatory Visit (INDEPENDENT_AMBULATORY_CARE_PROVIDER_SITE_OTHER): Payer: BC Managed Care – PPO | Admitting: Family Medicine

## 2011-11-06 DIAGNOSIS — J309 Allergic rhinitis, unspecified: Secondary | ICD-10-CM

## 2011-11-06 DIAGNOSIS — J209 Acute bronchitis, unspecified: Secondary | ICD-10-CM

## 2011-11-06 MED ORDER — AZITHROMYCIN 250 MG PO TABS: ORAL_TABLET | ORAL | Status: AC

## 2011-11-06 NOTE — Patient Instructions (Signed)
This appears to be an allergy/bronchitis combo Restart Claritin or Zyrtec daily for the allergies Take the Zpack as directed Add Mucinex to thin your congestion Drink plenty of fluids REST! Call with any questions or concerns Hang in there!

## 2011-11-06 NOTE — Progress Notes (Signed)
  Subjective:    Patient ID: Timothy Stone, male    DOB: [birth year], 71 y.o.   MRN: [id number]  HPI URI- 'terrible cold'.  sxs started 1 week ago.  Some nasal congestion, minor sore throat first thing in the AM, no ear pain, no sinus pain, cough is mostly dry.  No fevers.  No known sick contacts.  + seasonal allergies.  Not currently on meds.   Review of Systems For ROS see HPI     Objective:   Physical Exam  Constitutional: He appears well-developed and well-nourished. No distress.  HENT:  Head: Normocephalic and atraumatic.  Right Ear: Tympanic membrane normal.  Left Ear: Tympanic membrane normal.  Mouth/Throat: Mucous membranes are normal. Posterior oropharyngeal erythema present. No oropharyngeal exudate.       No TTP over sinuses + turbinate edema + PND TMs normal bilaterally  Eyes: Conjunctivae and EOM are normal. Pupils are equal, round, and reactive to light.  Neck: Normal range of motion. Neck supple.  Cardiovascular: Normal rate, regular rhythm and normal heart sounds.   Pulmonary/Chest: Effort normal and breath sounds normal. No respiratory distress. He has no wheezes.       + hacking cough  Lymphadenopathy:    He has no cervical adenopathy.  Skin: Skin is warm and dry.          Assessment & Plan:

## 2011-11-06 NOTE — Assessment & Plan Note (Signed)
New.  Pt's cough consistent w/ bronchitis.  Start zpack.  Reviewed supportive care and red flags that should prompt return.  Pt expressed understanding and is in agreement w/ plan.

## 2011-11-06 NOTE — Assessment & Plan Note (Signed)
Chronic problem- not currently being treated.  Restart OTC antihistamine.  Reviewed supportive care and red flags that should prompt return.  Pt expressed understanding and is in agreement w/ plan.

## 2012-01-06 ENCOUNTER — Other Ambulatory Visit: Payer: Self-pay | Admitting: Internal Medicine

## 2012-01-07 NOTE — Telephone Encounter (Signed)
Refill done.  

## 2012-01-20 ENCOUNTER — Encounter: Payer: Self-pay | Admitting: Internal Medicine

## 2012-01-20 ENCOUNTER — Ambulatory Visit (INDEPENDENT_AMBULATORY_CARE_PROVIDER_SITE_OTHER): Payer: BC Managed Care – PPO | Admitting: Internal Medicine

## 2012-01-20 VITALS — BP 140/90 | HR 57 | Temp 97.7°F | Wt 211.0 lb

## 2012-01-20 DIAGNOSIS — I10 Essential (primary) hypertension: Secondary | ICD-10-CM

## 2012-01-20 DIAGNOSIS — E119 Type 2 diabetes mellitus without complications: Secondary | ICD-10-CM | POA: Insufficient documentation

## 2012-01-20 DIAGNOSIS — N390 Urinary tract infection, site not specified: Secondary | ICD-10-CM

## 2012-01-20 DIAGNOSIS — R739 Hyperglycemia, unspecified: Secondary | ICD-10-CM

## 2012-01-20 DIAGNOSIS — R7309 Other abnormal glucose: Secondary | ICD-10-CM

## 2012-01-20 MED ORDER — DOXAZOSIN MESYLATE 4 MG PO TABS: 4.0000 mg | ORAL_TABLET | Freq: Every day | ORAL | Status: DC

## 2012-01-20 MED ORDER — BISOPROLOL-HYDROCHLOROTHIAZIDE 2.5-6.25 MG PO TABS: 1.0000 | ORAL_TABLET | Freq: Every day | ORAL | Status: DC

## 2012-01-20 NOTE — Assessment & Plan Note (Signed)
Followup by urology, having trouble getting abx covered by his insurance

## 2012-01-20 NOTE — Assessment & Plan Note (Addendum)
Today we discussed a most recent A1c, the meaning of the  test was discussed with the patient. We also discussed diet and exercise, encouraged to exercise at least 30 minutes daily, also recommend to start gradually.

## 2012-01-20 NOTE — Progress Notes (Signed)
  Subjective:    Patient ID: Timothy Stone, male    DOB: 03-Apr-1941, 71 y.o.   MRN: [id number]  HPI Followup He was recently diagnosed with hyperglycemia, A1c 5.6.  Past Medical History  Diagnosis Date  . Umbilical hernia     no surgery  . DVT (deep venous thrombosis) 2005    no know triggers per pt  . BPH (benign prostatic hyperplasia)     s/p TUNA  . HTN (hypertension)     dx in the 70s  . OSA (obstructive sleep apnea)     on CPAP  . Recurrent UTI   . Actinic keratosis     hyperplastic aktinic keratosis forehead s/p surgery Dr Park Liter     Review of Systems In general doing well, good medication compliance, ambulatory blood pressures within normal. His diet is healthy Not exercising regularly, works approximately 10 hours daily.     Objective:   Physical Exam  General -- alert, well-developed, and overweight appearing. No apparent distress.  Neurologic-- alert & oriented X3 and strength normal in all extremities. Psych-- Cognition and judgment appear intact. Alert and cooperative with normal attention span and concentration.  not anxious appearing and not depressed appearing.       Assessment & Plan:  Today , I spent more than 16  min with the patient, >50% of the time counseling

## 2012-01-20 NOTE — Assessment & Plan Note (Signed)
Well-controlled, no change, last BMP okay

## 2012-01-24 ENCOUNTER — Encounter: Payer: Self-pay | Admitting: Internal Medicine

## 2012-01-25 ENCOUNTER — Encounter: Payer: Self-pay | Admitting: *Deleted

## 2012-02-26 ENCOUNTER — Telehealth: Payer: Self-pay | Admitting: Internal Medicine

## 2012-02-26 DIAGNOSIS — Z1211 Encounter for screening for malignant neoplasm of colon: Secondary | ICD-10-CM

## 2012-02-26 NOTE — Telephone Encounter (Signed)
Ok to set up referral? 

## 2012-02-26 NOTE — Telephone Encounter (Signed)
Please arrange a GI referral for colonoscopy, I believe he had his colonoscopy in [facility]

## 2012-02-26 NOTE — Telephone Encounter (Signed)
Pt needs a referral for colonoscopy. He states he was due in February.

## 2012-02-26 NOTE — Telephone Encounter (Signed)
Referral entered  

## 2012-03-02 ENCOUNTER — Encounter: Payer: Self-pay | Admitting: Internal Medicine

## 2012-03-02 ENCOUNTER — Ambulatory Visit (INDEPENDENT_AMBULATORY_CARE_PROVIDER_SITE_OTHER): Payer: BC Managed Care – PPO | Admitting: Internal Medicine

## 2012-03-02 VITALS — BP 128/84 | HR 62 | Temp 97.9°F | Wt 212.0 lb

## 2012-03-02 DIAGNOSIS — J069 Acute upper respiratory infection, unspecified: Secondary | ICD-10-CM

## 2012-03-02 MED ORDER — FLUTICASONE PROPIONATE 50 MCG/ACT NA SUSP: 2.0000 | Freq: Every day | NASAL | Status: DC

## 2012-03-02 MED ORDER — AZITHROMYCIN 250 MG PO TABS: ORAL_TABLET | ORAL | Status: AC

## 2012-03-02 NOTE — Patient Instructions (Addendum)
Rest, fluids , tylenol For cough, take Mucinex DM twice a day as needed  Start back Flonase  y until you feel better For congestion use astelin nasal spray twice a day until you feel better Take the antibiotic as prescribed  (Zpack) if not better in 3 days Call if no better in few days Call anytime if the symptoms are severe

## 2012-03-02 NOTE — Progress Notes (Signed)
  Subjective:    Patient ID: Timothy Stone, male    DOB: [birth year], 71 y.o.   MRN: [id number]  HPI Acute visit Developed a sore throat, cough 2 days ago. Today he had 2 loose bowel movements and mild nausea. Some fatigue today. He has not been taking Flonase lately.  . Past Medical History  Diagnosis Date  . Umbilical hernia     no surgery  . DVT (deep venous thrombosis) 2005    no know triggers per pt  . BPH (benign prostatic hyperplasia)     s/p TUNA  . HTN (hypertension)     dx in the 70s  . OSA (obstructive sleep apnea)     on CPAP  . Recurrent UTI   . Actinic keratosis     hyperplastic aktinic keratosis forehead s/p surgery Dr Park Liter    Review of Systems No fever or chills No chest pain, shortness of breath ; mild chest congestion. Denies sinus pain or congestion Denies abdominal pain, blood in the stools or vomiting.    Objective:   Physical Exam  General -- alert, well-developed. No apparent distress.  HEENT -- TMs normal, throat w/o redness, face symmetric and not tender to palpation, nose slt  congested  Lungs -- normal respiratory effort, no intercostal retractions, no accessory muscle use, and normal breath sounds except for very few ronchi.  Heart-- normal rate, regular rhythm, no murmur, and no gallop.   Extremities-- no pretibial edema bilaterally  Neurologic-- alert & oriented X3 and strength normal in all extremities. Psych-- Cognition and judgment appear intact. Alert and cooperative with normal attention span and concentration.  not anxious appearing and not depressed appearing.      Assessment & Plan:  URI, Symptoms consistent with URI, few months ago had similar symptoms and end up getting worse; a zpack helped. Plan: see instructions

## 2012-08-10 ENCOUNTER — Encounter: Payer: BC Managed Care – PPO | Admitting: Internal Medicine

## 2012-08-22 ENCOUNTER — Encounter: Payer: BC Managed Care – PPO | Admitting: Internal Medicine

## 2012-08-24 ENCOUNTER — Other Ambulatory Visit: Payer: Self-pay | Admitting: Internal Medicine

## 2012-08-25 NOTE — Telephone Encounter (Signed)
Refill done.  

## 2012-10-22 ENCOUNTER — Other Ambulatory Visit: Payer: Self-pay

## 2012-11-01 ENCOUNTER — Encounter: Payer: Self-pay | Admitting: Internal Medicine

## 2012-11-01 ENCOUNTER — Ambulatory Visit (INDEPENDENT_AMBULATORY_CARE_PROVIDER_SITE_OTHER): Payer: BC Managed Care – PPO | Admitting: Internal Medicine

## 2012-11-01 VITALS — BP 138/82 | HR 57 | Temp 97.9°F | Ht 66.5 in | Wt 204.0 lb

## 2012-11-01 DIAGNOSIS — N390 Urinary tract infection, site not specified: Secondary | ICD-10-CM

## 2012-11-01 DIAGNOSIS — R7309 Other abnormal glucose: Secondary | ICD-10-CM

## 2012-11-01 DIAGNOSIS — Z0000 Encounter for general adult medical examination without abnormal findings: Secondary | ICD-10-CM

## 2012-11-01 DIAGNOSIS — I10 Essential (primary) hypertension: Secondary | ICD-10-CM

## 2012-11-01 DIAGNOSIS — K429 Umbilical hernia without obstruction or gangrene: Secondary | ICD-10-CM | POA: Insufficient documentation

## 2012-11-01 DIAGNOSIS — R079 Chest pain, unspecified: Secondary | ICD-10-CM | POA: Insufficient documentation

## 2012-11-01 DIAGNOSIS — G4733 Obstructive sleep apnea (adult) (pediatric): Secondary | ICD-10-CM

## 2012-11-01 DIAGNOSIS — R739 Hyperglycemia, unspecified: Secondary | ICD-10-CM

## 2012-11-01 LAB — CBC WITH DIFFERENTIAL/PLATELET
Basophils Absolute: 0 10*3/uL (ref 0.0–0.1)
HCT: 41 % (ref 39.0–52.0)
Hemoglobin: 13.8 g/dL (ref 13.0–17.0)
Lymphs Abs: 2.2 10*3/uL (ref 0.7–4.0)
MCHC: 33.7 g/dL (ref 30.0–36.0)
MCV: 88.6 fl (ref 78.0–100.0)
Monocytes Relative: 5.3 % (ref 3.0–12.0)
Neutro Abs: 2.8 10*3/uL (ref 1.4–7.7)
RDW: 13.9 % (ref 11.5–14.6)

## 2012-11-01 LAB — COMPREHENSIVE METABOLIC PANEL
ALT: 26 U/L (ref 0–53)
AST: 25 U/L (ref 0–37)
Alkaline Phosphatase: 44 U/L (ref 39–117)
Creatinine, Ser: 1.1 mg/dL (ref 0.4–1.5)
GFR: 73.71 mL/min (ref >60.00)
Total Bilirubin: 0.8 mg/dL (ref 0.3–1.2)

## 2012-11-01 LAB — TSH: TSH: 1.2 u[IU]/mL (ref 0.35–5.50)

## 2012-11-01 LAB — LIPID PANEL: VLDL: 23.4 mg/dL (ref 0.0–40.0)

## 2012-11-01 LAB — HEMOGLOBIN A1C: Hgb A1c MFr Bld: 5.9 % (ref 4.6–6.5)

## 2012-11-01 MED ORDER — BISOPROLOL-HYDROCHLOROTHIAZIDE 5-6.25 MG PO TABS: 1.0000 | ORAL_TABLET | Freq: Every day | ORAL | Status: DC

## 2012-11-01 MED ORDER — FLUTICASONE PROPIONATE 50 MCG/ACT NA SUSP: NASAL | Status: DC

## 2012-11-01 MED ORDER — DOXAZOSIN MESYLATE 4 MG PO TABS: 4.0000 mg | ORAL_TABLET | Freq: Every day | ORAL | Status: DC

## 2012-11-01 NOTE — Progress Notes (Signed)
  Subjective:    Patient ID: Timothy Stone, male    DOB: [birth year], 72 y.o.   MRN: [id number]  HPI CPX  Past Medical History  Diagnosis Date  . Umbilical hernia     no surgery  . DVT (deep venous thrombosis) 2005    no know triggers per pt  . BPH (benign prostatic hyperplasia)     s/p TUNA  . HTN (hypertension)     dx in the 70s  . OSA (obstructive sleep apnea)     on CPAP  . Recurrent UTI   . Actinic keratosis     hyperplastic aktinic keratosis forehead s/p surgery Dr Park Liter   Past Surgical History  Procedure Laterality Date  . Prostate surgery      TUNA around 2003   History   Social History  . Marital Status: Married    Spouse Name: N/A    Number of Children: 4  . Years of Education: N/A   Occupational History  . 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 Smoking status: Never Smoker   . Smokeless tobacco: Never Used  . Alcohol Use: Yes     Comment: 2x per week  . Drug Use: No  . Sexually Active: Not on file   Other Topics Concern  . Not on file   Social History Narrative   Regular exercise: used to walk more ---   Diet: regular              Family History  Problem Relation Age of Onset  . Diabetes      M? sister?  . Heart attack Father 58  . Breast cancer Sister   . Rectal cancer Sister 44    @ time of her first Cscope  . Prostate cancer Neg Hx   . Lung cancer Brother 81    smoker      Review of Systems Occasional "tightness, stress" at the anterior center of the chest. Happens once a month, can describe it any further, denies heartburn per se. "I don't think is anything". Denies shortness of breath.  No nausea, vomiting, diarrhea or blood in the stools. Lost a brother to lung cancer few weeks ago, it was very hard for him but in general he is doing okay emotionally. Not using his CPAP lately, see assessment and plan. Medication list reviewed, good  compliance.     Objective:   Physical Exam General -- alert, well-developed, BMI 32 .   Neck --no thyromegaly , normal carotid pulse Lungs -- normal respiratory effort, no intercostal retractions, no accessory muscle use, and normal breath sounds.   Heart-- normal rate, regular rhythm, no murmur, and no gallop.   Abdomen--soft, non-tender, no distention; has a 1.5 inch umbilical hernia, unable to reduce it all the way, mostly because patient discomfort. No warmness or redness.   Extremities-- no pretibial edema bilaterally  Neurologic-- alert & oriented X3 and strength normal in all extremities. Psych-- Cognition and judgment appear intact. Alert and cooperative with normal attention span and concentration.  not anxious appearing and not depressed appearing.      Assessment & Plan:

## 2012-11-01 NOTE — Patient Instructions (Addendum)
Please get your x-ray at the other [place]  office located at: [address], across from [facility].  Please go to the basement, this is a walk-in facility, they are open from 8:30 to 5:30 PM. Phone number [phone].  

## 2012-11-01 NOTE — Assessment & Plan Note (Signed)
(  CPAP is not working correctly, urged him to fix it

## 2012-11-01 NOTE — Assessment & Plan Note (Addendum)
Today the patient reports a ill-defined chest discomfort, although he seems to be  trying to minimize his symptoms I'm concern. EKG today w/o acute changes. CV RF : HTN, OSA, age Plan-- stress test, CXR

## 2012-11-01 NOTE — Assessment & Plan Note (Signed)
On abx x 3/week

## 2012-11-01 NOTE — Assessment & Plan Note (Signed)
Long history of umbilical hernia, size is about the same as a few years ago, I have been unable to reduce it all the way today. In the past he reports has seen general surgery and at some point they were talking about repair. I'm somehow concerned about the lack of complete reduction. He's quite reluctant to see surgery again. Plan: Close observation, incarceration symptoms discussed.

## 2012-11-01 NOTE — Assessment & Plan Note (Addendum)
Td 05 pneumonia shot 2009 Shingles immunization --2011  Ha a Cscope @ [facility] 11-08-2001 Dr Loman Chroman: small internal Hemorrhoids Repeated Cscope 2013, no reeport, will call Dr Loman Chroman office  DRE PSA per urology  Diet-exercise discussed; rec against high Vit E doses

## 2012-11-01 NOTE — Assessment & Plan Note (Signed)
Labs

## 2012-11-01 NOTE — Assessment & Plan Note (Signed)
Ambulatory BPs average 130/70. No change

## 2012-11-02 ENCOUNTER — Ambulatory Visit (INDEPENDENT_AMBULATORY_CARE_PROVIDER_SITE_OTHER)
Admission: RE | Admit: 2012-11-02 | Discharge: 2012-11-02 | Disposition: A | Discharge status: Home / Self Care | Payer: BC Managed Care – PPO | Source: Ambulatory Visit | Attending: Internal Medicine | Admitting: Internal Medicine

## 2012-11-02 DIAGNOSIS — R079 Chest pain, unspecified: Secondary | ICD-10-CM

## 2012-11-10 ENCOUNTER — Encounter (HOSPITAL_COMMUNITY): Payer: BC Managed Care – PPO

## 2012-11-14 ENCOUNTER — Ambulatory Visit (HOSPITAL_COMMUNITY): Payer: BC Managed Care – PPO | Attending: Internal Medicine | Admitting: Radiology

## 2012-11-14 VITALS — BP 156/96 | Ht 67.0 in | Wt 203.0 lb

## 2012-11-14 DIAGNOSIS — R079 Chest pain, unspecified: Secondary | ICD-10-CM | POA: Insufficient documentation

## 2012-11-14 DIAGNOSIS — R0789 Other chest pain: Secondary | ICD-10-CM

## 2012-11-14 MED ORDER — REGADENOSON 0.4 MG/5ML IV SOLN
0.4000 mg | Freq: Once | INTRAVENOUS | Status: AC
  Administered 2012-11-14: 0.4 mg via INTRAVENOUS

## 2012-11-14 MED ORDER — TECHNETIUM TC 99M SESTAMIBI GENERIC - CARDIOLITE
30.0000 | Freq: Once | INTRAVENOUS | Status: AC | PRN
  Administered 2012-11-14: 30 via INTRAVENOUS

## 2012-11-14 MED ORDER — TECHNETIUM TC 99M SESTAMIBI GENERIC - CARDIOLITE
10.0000 | Freq: Once | INTRAVENOUS | Status: AC | PRN
  Administered 2012-11-14: 10 via INTRAVENOUS

## 2012-11-14 NOTE — Progress Notes (Signed)
[facility] SITE 3 NUCLEAR MED [address] [place], [place] [address] [phone]    Cardiology Nuclear Med Study  Timothy Stone is a 72 y.o. male     MRN : [id number]     DOB: [birth year]  Procedure Date: 11/14/2012  Nuclear Med Background Indication for Stress Test:  Evaluation for Ischemia History:  30 yrs ago GXT: in [place] NL per pt DVT Cardiac Risk Factors: Family History - CAD, Hypertension and Lipids  Symptoms:  Chest Pain and Chest Tightness (last date of chest discomfort 11/15/2018)   Nuclear Pre-Procedure Caffeine/Decaff Intake:  None NPO After: 7:00pm   Lungs:  clear O2 Sat: 96% on room air. IV 0.9% NS with Angio Cath:  22g  IV Site: R Hand  IV Started by:  Cathlyn Parsons, RN  Chest Size (in):  44 Cup Size: n/a  Height: 5\' 7"  (1.702 m)  Weight:  203 lb (92.08 kg)  BMI:  Body mass index is 31.79 kg/(m^2). Tech Comments:  Ziac taken this am.     Nuclear Med Study 1 or 2 day study: 1 day  Stress Test Type:  Treadmill/Lexiscan  Reading MD: Dietrich Pates, MD  Order Authorizing Provider:  Elita Quick Paz,MD  Resting Radionuclide: Technetium 49m Sestamibi  Resting Radionuclide Dose: 10.6 mCi   Stress Radionuclide:  Technetium 66m Sestamibi  Stress Radionuclide Dose: 32.8 mCi           Stress Protocol Rest HR: 60 Stress HR: 114  Rest BP: 156/96 Stress BP: 120/74  Exercise Time (min): 9:05 METS: 8.10   Predicted Max HR: 148 bpm % Max HR: 77.03 bpm Rate Pressure Product: 21308   Dose of Adenosine (mg):  n/a Dose of Lexiscan: 0.4 mg  Dose of Atropine (mg): n/a Dose of Dobutamine: n/a mcg/kg/min (at max HR)  Stress Test Technologist: Milana Na, EMT-P  Nuclear Technologist:  Domenic Polite, CNMT     Rest Procedure:  Myocardial perfusion imaging was performed at rest 45 minutes following the intravenous administration of Technetium 61m Sestamibi. Rest ECG: NSR with occasional PVC  Stress Procedure:  The patient received IV Lexiscan 0.4 mg over  15-seconds with concurrent low level exercise and then Technetium 17m Sestamibi was injected at 30-seconds while the patient continued walking one more minute. This patient had hip pain, sob, and was unable to keep up with the treadmill and had to be switched to a walking Lexiscan. He had freq pvcs with exercise. Quantitative spect images were obtained after a 45-minute delay. Stress ECG: No significant change from baseline  QPS Raw Data Images: Images were motion corrected.  SOft tissue (diaphragm)underlies heart. Stress Images:  Normal perfusion. Rest Images:  Normal homogeneous uptake in all areas of the myocardium. Subtraction (SDS):  No evidence of ischemia. Transient Ischemic Dilatation (Normal <1.22):  0.89 Lung/Heart Ratio (Normal <0.45):  0.36  Quantitative Gated Spect Images QGS EDV:  101 ml QGS ESV:  39 ml  Impression Exercise Capacity:  Good exercise capacity BP Response:  Hypotensive blood pressure response. Clinical Symptoms:  No chest pain. ECG Impression:  No significant ST segment change suggestive of ischemia. Comparison with Prior Nuclear Study: NO report to compare.  Overall Impression:  Normal stress nuclear study.  LV Ejection Fraction: 62%.  LV Wall Motion:  NL LV Function; NL Wall Motion  Dietrich Pates

## 2012-11-17 ENCOUNTER — Encounter: Payer: Self-pay | Admitting: Internal Medicine

## 2013-01-12 ENCOUNTER — Encounter: Payer: Self-pay | Admitting: Internal Medicine

## 2013-02-06 ENCOUNTER — Ambulatory Visit (INDEPENDENT_AMBULATORY_CARE_PROVIDER_SITE_OTHER): Payer: BC Managed Care – PPO | Admitting: Internal Medicine

## 2013-02-06 ENCOUNTER — Encounter: Payer: Self-pay | Admitting: Internal Medicine

## 2013-02-06 VITALS — BP 118/68 | HR 66 | Temp 98.0°F | Wt 199.0 lb

## 2013-02-06 DIAGNOSIS — J209 Acute bronchitis, unspecified: Secondary | ICD-10-CM

## 2013-02-06 MED ORDER — AZITHROMYCIN 250 MG PO TABS: ORAL_TABLET | ORAL | Status: DC

## 2013-02-06 MED ORDER — HYDROCODONE-HOMATROPINE 5-1.5 MG/5ML PO SYRP: 5.0000 mL | ORAL_SOLUTION | Freq: Every evening | ORAL | Status: DC | PRN

## 2013-02-06 NOTE — Progress Notes (Signed)
  Subjective:    Patient ID: Timothy Stone, male    DOB: [birth year], 72 y.o.   MRN: [id number]  HPI Acute visit Sick x the last 2 or 3 days: Cough, sore throat, yellow sputum. Very congested in the mornings, "takes an hour to clear all the  mucus". Taking OTC cough medication.  Past Medical History  Diagnosis Date  . Umbilical hernia     no surgery  . DVT (deep venous thrombosis) 2005    no know triggers per pt  . BPH (benign prostatic hyperplasia)     s/p TUNA  . HTN (hypertension)     dx in the 70s  . OSA (obstructive sleep apnea)     on CPAP  . Recurrent UTI   . 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 Smoking status: Never Smoker   . Smokeless tobacco: Never Used  . Alcohol Use: Yes     Comment: 2x per week    Review of Systems No fever or chills No hemoptysis No nausea, vomiting, diarrhea or myalgias. Mild sinus congestion.    Objective:   Physical Exam   BP 118/68  Pulse 66  Temp(Src) 98 F (36.7 C) (Oral)  Wt 199 lb (90.266 kg)  BMI 31.16 kg/m2  SpO2 96% General -- alert, well-developed, NAD .   HEENT -- TMs normal, throat w/o redness, face symmetric and not tender to palpation, nose slt congested w/o d/c Lungs -- normal respiratory effort, no intercostal retractions, no accessory muscle use, and few ronchi w/  Cough, no rales or wheezing Heart-- normal rate, regular rhythm, no murmur, and no gallop.    Psych-- Cognition and judgment appear intact. Alert and cooperative with normal attention span and concentration.  not anxious appearing and not depressed appearing.       Assessment & Plan:  Bronchitis, See instructions Occasionally has difficult time sleeping due to cough. Will try hydrocodone

## 2013-02-06 NOTE — Patient Instructions (Addendum)
Rest, fluids , tylenol For cough, take Mucinex DM twice a day as needed  If the cough is severe at night, take hydrocodone, will make you sleepy Take the antibiotic as prescribed  (zithromax) only if no better in 3-4 days  Call if no better in few days Call anytime if the symptoms are severe

## 2013-02-14 ENCOUNTER — Ambulatory Visit (INDEPENDENT_AMBULATORY_CARE_PROVIDER_SITE_OTHER): Payer: BC Managed Care – PPO | Admitting: Internal Medicine

## 2013-02-14 VITALS — BP 118/82 | HR 68 | Temp 98.4°F | Wt 204.0 lb

## 2013-02-14 DIAGNOSIS — J209 Acute bronchitis, unspecified: Secondary | ICD-10-CM

## 2013-02-14 MED ORDER — PREDNISONE 10 MG PO TABS: ORAL_TABLET | ORAL | Status: DC

## 2013-02-14 NOTE — Patient Instructions (Addendum)
Rest, fluids , tylenol For cough, continue with mucinex DM twice a day as needed  Prednisone x 5 days Call if no better in few days Call anytime if the symptoms are severe

## 2013-02-14 NOTE — Progress Notes (Signed)
  Subjective:    Patient ID: Timothy Stone, male    DOB: 1941-06-13, 72 y.o.   MRN: [id number]  HPI Here for re- evaluation of bronchitis. Since the last time he was seen a few days ago, he is taking Mucinex and Tylenol. He took hydrocodone for 3 days, the last day he felt really poorly "couldn't breathe, felt bad" consequently he discontinue hydrocodone. He started Zithromax 5 days ago. At this point the amount of sputum he is coughing and sore throat have decreased but he's not completely back to baseline. Still has a hoarse voice that bothers him  Past Medical History  Diagnosis Date  . Umbilical hernia     no surgery  . DVT (deep venous thrombosis) 2005    no know triggers per pt  . BPH (benign prostatic hyperplasia)     s/p TUNA  . HTN (hypertension)     dx in the 70s  . OSA (obstructive sleep apnea)     on CPAP  . Recurrent UTI   . Actinic keratosis     hyperplastic aktinic keratosis forehead s/p surgery Dr Park Liter   Past Surgical History  Procedure Laterality Date  . Prostate surgery      TUNA around 2003    History  Substance Use Topics  . Smoking status: Never Smoker   . Smokeless tobacco: Never Used  . Alcohol Use: Yes     Comment: 2x per week     Review of Systems No fever or chills No shortness of breath or wheezing. Minimal if any sinus pain.     Objective:   Physical Exam General -- alert, well-developed,NAD .     HEENT -- TMs normal, throat w/o redness, face symmetric and not tender to palpation, nose w/ minimal congestion Lungs -- normal respiratory effort, no intercostal retractions, no accessory muscle use, no wheezing, minimal rhonchi with cough. Heart-- normal rate, regular rhythm, no murmur, and no gallop.    Neurologic-- alert & oriented X3 and strength normal in all extremities. Psych-- Cognition and judgment appear intact. Alert and cooperative with normal attention span and concentration.  not anxious appearing and not depressed  appearing.        Assessment & Plan:  Bronchitis, Gradually improving but not back to normal. He finished Zithromax yesterday. And this point I recommend to continue Tylenol, Mucinex, I think a low dose of prednisone will help clear the chest congestion and hoarseness. If he develops side effects such as insomnia he could take Benadryl. If not better by next week he will call, doxycycline?  Intolerant to hydrocodone,  does not like to take it again. Hydrocodone was placed under "allergies".

## 2013-02-15 ENCOUNTER — Encounter: Payer: Self-pay | Admitting: Internal Medicine

## 2013-02-23 ENCOUNTER — Telehealth: Payer: Self-pay | Admitting: Internal Medicine

## 2013-02-23 ENCOUNTER — Encounter: Payer: Self-pay | Admitting: Family Medicine

## 2013-02-23 ENCOUNTER — Ambulatory Visit (INDEPENDENT_AMBULATORY_CARE_PROVIDER_SITE_OTHER): Payer: BC Managed Care – PPO | Admitting: Family Medicine

## 2013-02-23 VITALS — BP 120/80 | HR 71 | Temp 98.6°F | Wt 205.6 lb

## 2013-02-23 DIAGNOSIS — J020 Streptococcal pharyngitis: Secondary | ICD-10-CM

## 2013-02-23 NOTE — Progress Notes (Signed)
  Subjective:    Patient ID: Timothy Stone, male    DOB: Jan 25, 1941, 72 y.o.   MRN: [id number]  HPI Sore throat- seen by Dr Drue Novel on 6/10 and tx'd for bronchitis.  Now w/ 'terrible sore throat' x4 days.  Will come and go.  'real bad at 2 am'.  sxs improve as day goes on.  Denies excessive PND.  No fevers.  Mild cough but better than previous.  Still using Flonase.  Not taking OTC antihistamine.  + hoarseness.  No sinus pain/pressure.  No ear pain.  Some relief w/ tylenol.   Review of Systems For ROS see HPI     Objective:   Physical Exam  Vitals reviewed. Constitutional: He appears well-developed and well-nourished. No distress.  HENT:  Head: Normocephalic and atraumatic.  No TTP over sinuses + turbinate edema + PND TMs normal bilaterally  Eyes: Conjunctivae and EOM are normal. Pupils are equal, round, and reactive to light.  Neck: Normal range of motion. Neck supple.  Cardiovascular: Normal rate, regular rhythm and normal heart sounds.   Pulmonary/Chest: Effort normal and breath sounds normal. No respiratory distress. He has no wheezes.  Lymphadenopathy:    He has no cervical adenopathy.  Skin: Skin is warm and dry.          Assessment & Plan:

## 2013-02-23 NOTE — Patient Instructions (Addendum)
This is not strep This is most likely viral or post nasal drip Drink plenty of fluids Alternate tylenol/ibuprofen every 4 hrs as needed for pain Restart the Claritin daily for the allergies Continue the Flonase Call with any questions or concerns Hang in there!

## 2013-02-23 NOTE — Telephone Encounter (Signed)
Patient Information:  Caller Name: [place]  Phone: [phone]  Patient: Timothy Stone, Timothy Stone  Gender: Male  DOB: March 04, 1941  Age: 72 Years  PCP: Willow Ora  Office Follow Up:  Does the office need to follow up with this patient?: No  Instructions For The Office: N/A  RN Note:  Sore throat pain, rated 8/10, woke him during the night.  Sore throat pain comes and goes duirng the day.  Acetaminiphen helps pain. Current pain rated 3/10. Had Acetaminiophen at 0630. 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   History of mono years ago.  Dr Drue Novel has no appointments so scheduled with Dr Beverely Low.    Symptoms  Reason For Call & Symptoms: Worsening, intermtitent sore throat in past 3-4 days;. Sore throat present since at least 02/04/13.   Cough and congestion improving after completion of Prednisone and Zpack.  Seen twice in past 3 weeks.  Reviewed Health History In EMR: Yes  Reviewed Medications In EMR: Yes  Reviewed Allergies In EMR: Yes  Reviewed Surgeries / Procedures: Yes  Date of Onset of Symptoms: 02/04/2013  Treatments Tried: gargling, Acetaminophen  Treatments Tried Worked: Yes  Guideline(s) Used:  Sore Throat  Disposition Per Guideline:   See Today in Office  Reason For Disposition Reached:   Severe sore throat pain  Advice Given:  For Relief of Sore Throat Pain:  Sip warm chicken broth or apple juice.  Gargle warm salt water 3 times daily (1 teaspoon of salt in 8 oz or 240 ml of warm water).  Pain Medicines:  For pain relief, you can take either acetaminophen, ibuprofen, or naproxen.  Acetaminophen (e.g., Tylenol):  Extra Strength Tylenol: Take 1,000 mg (two 500 mg pills) every 8 hours as needed. Each Extra Strength Tylenol pill has 500 mg of acetaminophen.  The most you should take each day is 3,000 mg (10 Regular Strength or 6 Extra Strength pills a day).  Ibuprofen (e.g., Motrin, Advil):  Take 400 mg (two 200 mg pills) by mouth every 6 hours.  Another choice is to  take 600 mg (three 200 mg pills) by mouth every 8 hours.  The most you should take each day is 1,200 mg (six 200 mg pills), unless your doctor has told you to take more.  Soft Diet:   Cold drinks and milk shakes are especially good (Reason: swollen tonsils can make some foods hard to swallow).  Expected Course:  Sore throats with viral illnesses usually last 3 or 4 days.  Call Back If:  Sore throat is the main symptom and it lasts longer than 24 hours  Sore throat is mild but lasts longer than 4 days  You become worse.  Patient Will Follow Care Advice:  YES  Appointment Scheduled:  02/23/2013 14:00:00 Appointment Scheduled Provider:  Sheliah Hatch.

## 2013-02-23 NOTE — Telephone Encounter (Signed)
Pt has an appt today w/ Dr. Beverely Low.

## 2013-02-23 NOTE — Telephone Encounter (Signed)
Patient Information:  Caller Name: [place]  Phone: [phone]  Patient: Timothy Stone, Timothy Stone  Gender: Male  DOB: 03/23/1941  Age: 72 Years  PCP: Willow Ora  Office Follow Up:  Does the office need to follow up with this patient?: No  Instructions For The Office: N/A  RN Note:  Sore throat pain, rated 8/10, woke him during the night.  Sore throat pain comes and goes during the day.  Acetaminiphen helps pain. Current pain rated 3/10. Had Acetaminophen at 0630. 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   History of mono years ago.  Dr Drue Novel has no appointments so scheduled with Dr Beverely Low.    Symptoms  Reason For Call & Symptoms: Worsening, intermtitent sore throat in past 3-4 days;. Sore throat present since at least 02/04/13.   Cough and congestion improving after completion of Prednisone and Zpack.  Seen twice in past 3 weeks.  Reviewed Health History In EMR: Yes  Reviewed Medications In EMR: Yes  Reviewed Allergies In EMR: Yes  Reviewed Surgeries / Procedures: Yes  Date of Onset of Symptoms: 02/04/2013  Treatments Tried: gargling, Acetaminophen  Treatments Tried Worked: Yes  Guideline(s) Used:  Sore Throat  Disposition Per Guideline:   See Today in Office  Reason For Disposition Reached:   Severe sore throat pain  Advice Given:  For Relief of Sore Throat Pain:  Sip warm chicken broth or apple juice.  Gargle warm salt water 3 times daily (1 teaspoon of salt in 8 oz or 240 ml of warm water).  Pain Medicines:  For pain relief, you can take either acetaminophen, ibuprofen, or naproxen.  Acetaminophen (e.g., Tylenol):  Extra Strength Tylenol: Take 1,000 mg (two 500 mg pills) every 8 hours as needed. Each Extra Strength Tylenol pill has 500 mg of acetaminophen.  The most you should take each day is 3,000 mg (10 Regular Strength or 6 Extra Strength pills a day).  Ibuprofen (e.g., Motrin, Advil):  Take 400 mg (two 200 mg pills) by mouth every 6 hours.  Another choice is to  take 600 mg (three 200 mg pills) by mouth every 8 hours.  The most you should take each day is 1,200 mg (six 200 mg pills), unless your doctor has told you to take more.  Soft Diet:   Cold drinks and milk shakes are especially good (Reason: swollen tonsils can make some foods hard to swallow).  Expected Course:  Sore throats with viral illnesses usually last 3 or 4 days.  Call Back If:  Sore throat is the main symptom and it lasts longer than 24 hours  Sore throat is mild but lasts longer than 4 days  You become worse.  Patient Will Follow Care Advice:  YES  Appointment Scheduled:  02/23/2013 14:00:00 Appointment Scheduled Provider:  Sheliah Hatch.

## 2013-02-23 NOTE — Telephone Encounter (Signed)
Pt has an appt scheduled today w/ Dr. Beverely Low.

## 2013-02-24 NOTE — Assessment & Plan Note (Signed)
Pt's rapid strep was (-) in office.  No obvious signs of bacterial infxn.  Suspect this is due to copious PND and under-treated seasonal allergies.  Encouraged regular use of nasal steroids and antihistamines.  Pt expressed understanding and is in agreement w/ plan.

## 2013-03-01 ENCOUNTER — Ambulatory Visit: Payer: BC Managed Care – PPO | Admitting: Internal Medicine

## 2013-03-17 ENCOUNTER — Ambulatory Visit (INDEPENDENT_AMBULATORY_CARE_PROVIDER_SITE_OTHER): Payer: BC Managed Care – PPO | Admitting: Internal Medicine

## 2013-03-17 ENCOUNTER — Encounter: Payer: Self-pay | Admitting: Internal Medicine

## 2013-03-17 VITALS — BP 125/82 | HR 65 | Temp 98.1°F | Wt 206.4 lb

## 2013-03-17 DIAGNOSIS — I10 Essential (primary) hypertension: Secondary | ICD-10-CM

## 2013-03-17 DIAGNOSIS — N390 Urinary tract infection, site not specified: Secondary | ICD-10-CM

## 2013-03-17 DIAGNOSIS — R079 Chest pain, unspecified: Secondary | ICD-10-CM

## 2013-03-17 DIAGNOSIS — J020 Streptococcal pharyngitis: Secondary | ICD-10-CM

## 2013-03-17 DIAGNOSIS — L989 Disorder of the skin and subcutaneous tissue, unspecified: Secondary | ICD-10-CM

## 2013-03-17 NOTE — Assessment & Plan Note (Addendum)
Asymptomatic, not taking antibiotics

## 2013-03-17 NOTE — Patient Instructions (Addendum)
Next visit in 7 months, fasting for a physical exam. Will refer you to a dermatologist, if you don't hear from [place] in one week, please call us

## 2013-03-17 NOTE — Progress Notes (Signed)
  Subjective:    Patient ID: Timothy Stone, male    DOB: [birth year], 72 y.o.   MRN: [id number]  HPI Routine followup Hypertension, good medication compliance, ambulatory BPs were within normal, very seldom he sees eye reading of 150/80. Bronchitis, finally resolved. Has a lesion on the face, see physical exam.  Past Medical History  Diagnosis Date  . Umbilical hernia     no surgery  . DVT (deep venous thrombosis) 2005    no know triggers per pt  . BPH (benign prostatic hyperplasia)     s/p TUNA  . HTN (hypertension)     dx in the 70s  . OSA (obstructive sleep apnea)     on CPAP  . Recurrent UTI   . Actinic keratosis     hyperplastic aktinic keratosis forehead s/p surgery Dr Park Liter   Past Surgical History  Procedure Laterality Date  . Prostate surgery      TUNA around 2003     Review of Systems No fever chills. No dysuria gross hematuria. No chest pain, cough or difficulty breathing. Med list reviewed, good compliance    Objective:   Physical Exam  HENT:  Head:     BP 125/82  Pulse 65  Temp(Src) 98.1 F (36.7 C) (Oral)  Wt 206 lb 6.4 oz (93.622 kg)  BMI 32.32 kg/m2  SpO2 97%   General -- alert, well-developed, Nad  Lungs -- normal respiratory effort, no intercostal retractions, no accessory muscle use, and normal breath sounds.   Heart-- normal rate, regular rhythm, no murmur, and no gallop.    Psych-- Cognition and judgment appear intact. Alert and cooperative with normal attention span and concentration.  not anxious appearing and not depressed appearing.      Assessment & Plan:  Facial skin lesion, refer to dermatology

## 2013-03-17 NOTE — Assessment & Plan Note (Signed)
Stress test 11-2012 negative, now asymptomatic.

## 2013-03-17 NOTE — Assessment & Plan Note (Signed)
Resolved

## 2013-03-17 NOTE — Assessment & Plan Note (Signed)
Well-controlled, no change 

## 2013-03-18 ENCOUNTER — Encounter: Payer: Self-pay | Admitting: Internal Medicine

## 2013-07-13 ENCOUNTER — Other Ambulatory Visit: Payer: Self-pay

## 2013-10-24 ENCOUNTER — Telehealth: Payer: Self-pay | Admitting: Internal Medicine

## 2013-10-24 NOTE — Telephone Encounter (Signed)
Receive results from labs performed 10/19/2013 CMP: Creatinine 1.1, potassium 4.7, calcium 9.0, AST ALT normal. Iron 46 normal. Total cholesterol 151, triglycerides 160, HDL 28, LDL 91. CBC show white count of 6.3, hemoglobin 14.3, platelets 192. PSA 2.0 Hemoglobin A1c 6.1 Results to be scan and discuss with the patient on return to the office

## 2013-11-02 ENCOUNTER — Other Ambulatory Visit: Payer: Self-pay | Admitting: Internal Medicine

## 2013-12-25 ENCOUNTER — Other Ambulatory Visit: Payer: Self-pay | Admitting: Internal Medicine

## 2013-12-26 NOTE — Telephone Encounter (Signed)
Pt called and is scheduled for CPE with fasting labs 02/16/2014 at 1:30pm.  bw

## 2013-12-26 NOTE — Telephone Encounter (Signed)
Rx's sent to the pharmacy by e-script.  Pt needs complete physical and fasting labs.//AB/CMA 

## 2014-02-15 ENCOUNTER — Telehealth: Payer: Self-pay | Admitting: *Deleted

## 2014-02-15 NOTE — Telephone Encounter (Addendum)
Left message for the patient to return my call.    Medication List and allergies:   90 Day supply/mail order: [facility] 90 day Local prescriptions[facility] [place] [facility]  Immunizations Due: Tdap, Prevnar  A/P FH, PSH, or Personal Hx: Flu vaccine: 04/2010 Tdap: 09/2003 PNA: 05/2008 Shingles: 05/2010 PSA: 1.88;  05/24/2013  [facility]: 05/2012 Polyp  To Discuss with Provider:

## 2014-02-16 ENCOUNTER — Encounter: Payer: Self-pay | Admitting: Internal Medicine

## 2014-02-16 ENCOUNTER — Ambulatory Visit (INDEPENDENT_AMBULATORY_CARE_PROVIDER_SITE_OTHER): Payer: BC Managed Care – PPO | Admitting: Internal Medicine

## 2014-02-16 VITALS — BP 107/67 | HR 51 | Temp 98.1°F | Wt 201.8 lb

## 2014-02-16 DIAGNOSIS — Z23 Encounter for immunization: Secondary | ICD-10-CM

## 2014-02-16 DIAGNOSIS — Z0000 Encounter for general adult medical examination without abnormal findings: Secondary | ICD-10-CM

## 2014-02-16 MED ORDER — BISOPROLOL-HYDROCHLOROTHIAZIDE 5-6.25 MG PO TABS: ORAL_TABLET | ORAL | Status: DC

## 2014-02-16 MED ORDER — FLUTICASONE PROPIONATE 50 MCG/ACT NA SUSP: NASAL | Status: DC

## 2014-02-16 MED ORDER — DOXAZOSIN MESYLATE 4 MG PO TABS: ORAL_TABLET | ORAL | Status: DC

## 2014-02-16 NOTE — Progress Notes (Signed)
Subjective:    Patient ID: Timothy HammersGabriel G Fortunato, male    DOB: [birth year], 73 y.o.   MRN: [id number]  DOS:  02/16/2014 Type of  Visit: CPX History: In general doing well, sometimes in the morning the left ankle is stiff and a  little achy, better after he walks.   ROS Diet-- healthy Exercise-- very active No  CP, SOB No palpitations, no lower extremity edema No orthopnea , DOE Denies  nausea, vomiting diarrhea, blood in the stools (-) cough, sputum production (-) wheezing, chest congestion occ allergies to pollen  No dysuria, gross hematuria, difficulty urinating  No anxiety, depression    Past Medical History  Diagnosis Date  . Umbilical hernia     no surgery  . DVT (deep venous thrombosis) 2005    no know triggers per pt  . BPH (benign prostatic hyperplasia)     s/p TUNA  . HTN (hypertension)     dx in the 70s  . OSA (obstructive sleep apnea)     on CPAP  . Recurrent UTI   . Actinic keratosis     hyperplastic aktinic keratosis forehead s/p surgery Dr Park LiterAlbertini    Past Surgical History  Procedure Laterality Date  . Prostate surgery      TUNA around 2003    History   Social History  . Marital Status: Married    Spouse Name: N/A    Number of Children: 4  . Years of Education: N/A   Occupational History  . 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 Smoking status: Never Smoker   . Smokeless tobacco: Never Used  . Alcohol Use: Yes     Comment: 2x per week  . Drug Use: No  . Sexual Activity: Not on file   Other Topics Concern  . Not on file   Social History Narrative                 Family History  Problem Relation Age of Onset  . Diabetes Mother     Judie PetitM, sister  . Heart attack Father [phone]  . Breast cancer Sister   . Rectal cancer Sister 665    @ time of her first Cscope  . Prostate cancer Neg Hx   . Lung cancer Brother [phone]    smoker        Medication List         This list is accurate as of: 02/16/14 11:59 PM.  Always use your most recent med list.               acetaminophen 500 MG tablet  Commonly known as:  TYLENOL  Take 500 mg by mouth as needed.     aspirin 81 MG tablet  Take 81 mg by mouth daily.     bisoprolol-hydrochlorothiazide 5-6.25 MG per tablet  Commonly known as:  ZIAC  TAKE 1 TABLET BY MOUTH DAILY.     doxazosin 4 MG tablet  Commonly known as:  CARDURA  TAKE 1 TABLET BY MOUTH AT BEDTIME.     fluticasone 50 MCG/ACT nasal spray  Commonly known as:  FLONASE  USE 2 SPRAYS NASALLY DAILY     Ibuprofen 200 MG Caps  Take by mouth as needed.           Objective:   Physical Exam BP 107/67  Pulse 51  Temp(Src) 98.1 F (36.7 C) (Oral)  Wt 201 lb  12.8 oz (91.536 kg)  SpO2 95%  General -- alert, well-developed, NAD.  Neck --no thyromegaly , normal carotid pulse  HEENT-- Not pale.  Lungs -- normal respiratory effort, no intercostal retractions, no accessory muscle use, and normal breath sounds.  Heart-- normal rate, regular rhythm, no murmur.  Abdomen-- Not distended, good bowel sounds,soft, non-tender. No rebound or rigidity. No mass,organomegaly.no bruit  Extremities-- no pretibial edema bilaterally ,normal pedal pulses, calves symmetric; ankles normal to  Inspection-palpation  Neurologic--  alert & oriented X3. Speech normal, gait appropriate for age, strength symmetric and appropriate for age.  DTRs symmetric. Psych-- Cognition and judgment appear intact. Cooperative with normal attention span and concentration. No anxious or depressed appearing.         Assessment & Plan:

## 2014-02-16 NOTE — Patient Instructions (Signed)
Next visit is for routine check up in 6 months

## 2014-02-16 NOTE — Assessment & Plan Note (Addendum)
Td 05 pneumonia shot 2009 prevnar 02-2014 Shingles immunization --2011  Ha a Cscope @ [facility] 11-08-2001 Dr Loman Chromanhoton: small internal Hemorrhoids Repeated Cscope 05-12-2012, 1 polyp, path report not available , next per Dr Loman Chromanhoton    DRE PSA per urology  Diet-exercise discussed Labs from 10-2013 At work reviewed, all ok Chronic issues seem well controlled Come back in 6 months for a checkup, will draw labs at that time

## 2014-03-07 ENCOUNTER — Telehealth: Payer: Self-pay | Admitting: Internal Medicine

## 2014-03-07 NOTE — Telephone Encounter (Signed)
Caller name: Vicente SereneGabriel  Call back number: [phone] Pharmacy: out of town, not know  Reason for call:  Pt is experiencing high blood pressure; most recent was 171/98.  Pt wants to know if he should adjust his medications.  Pt is out of town and can not come in to see PCP>

## 2014-03-07 NOTE — Telephone Encounter (Signed)
Spoke with patient who reports his blood pressure has been high since being out of town. Upon questioning of the patient, he has been out to eat several time sin the past few days and has taken in a lot of sodium. Advised the patient to limit sodium intake as much as possible as well as limit caffeine. Patient to continue to monitor his blood pressure and will call back if B/P is consistently elevated.

## 2014-03-13 ENCOUNTER — Encounter: Payer: Self-pay | Admitting: Internal Medicine

## 2014-03-13 ENCOUNTER — Ambulatory Visit (INDEPENDENT_AMBULATORY_CARE_PROVIDER_SITE_OTHER): Payer: BC Managed Care – PPO | Admitting: Internal Medicine

## 2014-03-13 VITALS — BP 120/60 | HR 63 | Temp 98.4°F | Wt 203.0 lb

## 2014-03-13 DIAGNOSIS — I10 Essential (primary) hypertension: Secondary | ICD-10-CM

## 2014-03-13 NOTE — Assessment & Plan Note (Addendum)
I reviewed several pages of amb BPs from May, June and July. Readings are indeed gradually increasing, in June BPs were 130-140. In July were 140 and up. BP today is very good. No obvious  triggers for elevated BP,gradually increasing BP may be simply essential hypertension. Plan: Increase bisoprolol HCTZ, continue monitor BPs, BMP 1 month. See instructions.

## 2014-03-13 NOTE — Progress Notes (Signed)
Subjective:    Patient ID: Timothy HammersGabriel G Stargell, male    DOB: 17-Mar-1941, 73 y.o.   MRN: [id number]  DOS:  03/13/2014 Type of visit - description: Acute History: His BP has been gradually increasing since May. Reports good compliance with medication; is taking less than one ibuprofen a day. May be eating a little more salt than usual. Also has a long history of headache, slightly more frequent lately? Features of the  headache remained the same.    ROS Denies chest pain, difficulty breathing or lower extremity edema Slightly increasing stress, job related   Past Medical History  Diagnosis Date  . Umbilical hernia     no surgery  . DVT (deep venous thrombosis) 2005    no know triggers per pt  . BPH (benign prostatic hyperplasia)     s/p TUNA  . HTN (hypertension)     dx in the 70s  . OSA (obstructive sleep apnea)     on CPAP  . Recurrent UTI   . Actinic keratosis     hyperplastic aktinic keratosis forehead s/p surgery Dr Park LiterAlbertini    Past Surgical History  Procedure Laterality Date  . Prostate surgery      TUNA around 2003    History   Social History  . Marital Status: Married    Spouse Name: N/A    Number of Children: 4  . Years of Education: N/A   Occupational History  . 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 Smoking status: Never Smoker   . Smokeless tobacco: Never Used  . Alcohol Use: Yes     Comment: 2x per week  . Drug Use: No  . Sexual Activity: Not on file   Other Topics Concern  . Not on file   Social History Narrative                    Medication List       This list is accurate as of: 03/13/14  6:04 PM.  Always use your most recent med list.               acetaminophen 500 MG tablet  Commonly known as:  TYLENOL  Take 500 mg by mouth as needed.     aspirin 81 MG tablet  Take 81 mg by mouth daily.     bisoprolol-hydrochlorothiazide 5-6.25 MG per  tablet  Commonly known as:  ZIAC  TAKE 2 TABLET BY MOUTH DAILY.     doxazosin 4 MG tablet  Commonly known as:  CARDURA  TAKE 1 TABLET BY MOUTH AT BEDTIME.     fluticasone 50 MCG/ACT nasal spray  Commonly known as:  FLONASE  USE 2 SPRAYS NASALLY DAILY     Ibuprofen 200 MG Caps  Take by mouth as needed.           Objective:   Physical Exam BP 120/60  Pulse 63  Temp(Src) 98.4 F (36.9 C) (Oral)  Wt 203 lb (92.08 kg)  SpO2 96% General -- alert, well-developed, NAD.    Lungs -- normal respiratory effort, no intercostal retractions, no accessory muscle use, and normal breath sounds.  Heart-- normal rate, regular rhythm, no murmur.  Extremities-- no pretibial edema bilaterally  Neurologic--  alert & oriented X3. Speech normal, gait appropriate for age, strength symmetric and appropriate for age.  Psych-- Cognition and judgment appear intact. Cooperative with normal attention span and  concentration. No anxious or depressed appearing.        Assessment & Plan:

## 2014-03-13 NOTE — Patient Instructions (Signed)
Increase bisoprolol hydrochlorothiazide to 2 tablets daily   Check the  blood pressure 2 or 3 times a week (at home or work) be sure it is between 110/60 and 140/85. Ideal blood pressure is 120/80. If it is consistently higher or lower, let me know   Please come back in one month for blood work only (BMP dx HTN) Will refill your meds then

## 2014-03-13 NOTE — Progress Notes (Signed)
Pre-visit discussion using our clinic review tool. No additional management support is needed unless otherwise documented below in the visit note.  

## 2014-04-13 ENCOUNTER — Other Ambulatory Visit (INDEPENDENT_AMBULATORY_CARE_PROVIDER_SITE_OTHER): Payer: BC Managed Care – PPO

## 2014-04-13 DIAGNOSIS — I10 Essential (primary) hypertension: Secondary | ICD-10-CM

## 2014-04-13 LAB — BASIC METABOLIC PANEL
BUN: 26 mg/dL — ABNORMAL HIGH (ref 6–23)
CO2: 27 mEq/L (ref 19–32)
CREATININE: 1.2 mg/dL (ref 0.4–1.5)
Calcium: 9 mg/dL (ref 8.4–10.5)
Chloride: 103 mEq/L (ref 96–112)
GFR: 62.93 mL/min (ref >60.00)
Glucose, Bld: 107 mg/dL — ABNORMAL HIGH (ref 70–99)
Potassium: 4.2 mEq/L (ref 3.5–5.1)
SODIUM: 136 meq/L (ref 135–145)

## 2014-05-01 ENCOUNTER — Telehealth: Payer: Self-pay

## 2014-05-01 NOTE — Telephone Encounter (Signed)
LMOM for Pt to return call.  

## 2014-05-01 NOTE — Telephone Encounter (Signed)
Timothy Stone [phone] cell or [phone] ext [phone] [facility] called he would like to talk about his adjustment to his Blood Pressure medicine, when he had his labs on 8.7.15 and Dr Drue Novel double his medicine, he has not spoke to anyone since then, so he was wondering does he continue to take double dose bisoprolol-hydrochlorothiazide (ZIAC) 5-6.25 MG per tablet

## 2014-05-02 MED ORDER — BISOPROLOL-HYDROCHLOROTHIAZIDE 5-6.25 MG PO TABS: 2.0000 | ORAL_TABLET | Freq: Every day | ORAL | Status: DC

## 2014-05-02 NOTE — Telephone Encounter (Signed)
Pt returned call, instructed him to continue taking BP medication as instructed. Refill sent to [facility].

## 2014-05-04 ENCOUNTER — Other Ambulatory Visit: Payer: Self-pay

## 2014-05-04 MED ORDER — BISOPROLOL-HYDROCHLOROTHIAZIDE 5-6.25 MG PO TABS: 2.0000 | ORAL_TABLET | Freq: Every day | ORAL | Status: DC

## 2014-05-31 ENCOUNTER — Telehealth: Payer: Self-pay

## 2014-05-31 DIAGNOSIS — G4730 Sleep apnea, unspecified: Secondary | ICD-10-CM

## 2014-05-31 NOTE — Telephone Encounter (Signed)
LMOM for Pt, received CPAP request from [facility], Dr. Drue Novel recommended arranging referral to a pulmonologist for supplies and for treatment of sleep apnea. Referral ordered.

## 2014-06-01 NOTE — Telephone Encounter (Signed)
Spoke with Pt, informed him that Dr. Drue Novel suggests him go to pulmonology for his CPAP supplies. Pt has appt with Dr. Vassie Loll on 10/15 at 1500. Faxed CPAP supply request to Dr. Reginia Naas office.

## 2014-06-21 ENCOUNTER — Institutional Professional Consult (permissible substitution): Payer: BC Managed Care – PPO | Admitting: Pulmonary Disease

## 2014-07-19 ENCOUNTER — Encounter: Payer: Self-pay | Admitting: Pulmonary Disease

## 2014-07-19 ENCOUNTER — Ambulatory Visit (INDEPENDENT_AMBULATORY_CARE_PROVIDER_SITE_OTHER): Payer: BC Managed Care – PPO | Admitting: Pulmonary Disease

## 2014-07-19 VITALS — BP 145/90 | HR 54 | Ht 66.0 in | Wt 205.0 lb

## 2014-07-19 DIAGNOSIS — G4733 Obstructive sleep apnea (adult) (pediatric): Secondary | ICD-10-CM

## 2014-07-19 NOTE — Patient Instructions (Signed)
We will contact [facility] to obtain your old sleep study & CPAP download report Then send Rx for new CPAP

## 2014-07-19 NOTE — Progress Notes (Signed)
   Subjective:    Patient ID: Timothy Stone, male    DOB: 02/03/1941, 73 y.o.   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 He was diagnosed many years ago and placed on C Pap 13 cm,about 7 years ago he was switched to auto C Pap with a new machine from [facility] He is now considering retirement and going on [employer]. He would like a new machine. He claims good compliance, and denies pressure or mask issues Bedtime is around 9:30 PM, sleep latency is minimal, he sleeps on his side with one pillow, wakes up once or twice during the night, denies nocturia, and is out of bed by 5 AM feeling rested without dryness of mouth or morning headaches. His weight fluctuates, but he has not gained more than 5 pounds since his and study. His blood pressure is well controlled on 2 medications  There is no history suggestive of cataplexy, sleep paralysis or parasomnias Epworth sleepiness score is 12 He has experienced good benefits from sleep apnea including improvement in daytime fatigue and energy. His wife has not noted snoring or witnessed apneas  Past Medical History  Diagnosis Date  . Umbilical hernia     no surgery  . DVT (deep venous thrombosis) 2005    no know triggers per pt  . BPH (benign prostatic hyperplasia)     s/p TUNA  . HTN (hypertension)     dx in the 70s  . OSA (obstructive sleep apnea)     on CPAP  . Recurrent UTI   . Actinic keratosis     hyperplastic aktinic keratosis forehead s/p surgery Dr Park LiterAlbertini      Review of Systems  Constitutional: Negative for fever and unexpected weight change.  HENT: Positive for sinus pressure. Negative for congestion, dental problem, ear pain, nosebleeds, postnasal drip, rhinorrhea, sneezing, sore throat and trouble swallowing.   Eyes: Negative for redness and itching.  Respiratory: Positive for shortness of breath. Negative for cough, chest tightness and wheezing.     Cardiovascular: Negative for palpitations and leg swelling.  Gastrointestinal: Negative for nausea and vomiting.  Genitourinary: Negative for dysuria.  Musculoskeletal: Negative for joint swelling.  Skin: Negative for rash.  Neurological: Negative for headaches.  Hematological: Does not bruise/bleed easily.  Psychiatric/Behavioral: Negative for dysphoric mood. The patient is not nervous/anxious.        Objective:   Physical Exam  Gen. Pleasant, obese, in no distress, normal affect ENT - no lesions, no post nasal drip, class 2-3 airway Neck: No JVD, no thyromegaly, no carotid bruits Lungs: no use of accessory muscles, no dullness to percussion, decreased without rales or rhonchi  Cardiovascular: Rhythm regular, heart sounds  normal, no murmurs or gallops, no peripheral edema Abdomen: soft and non-tender, no hepatosplenomegaly, BS normal. Musculoskeletal: No deformities, no cyanosis or clubbing Neuro:  alert, non focal, no tremors       Assessment & Plan:

## 2014-07-19 NOTE — Assessment & Plan Note (Signed)
We will contact [facility] to obtain your old sleep study & CPAP download report Then send Rx for new CPAP  Given excessive daytime somnolence, narrow pharyngeal exam, witnessed apneas & loud snoring, obstructive sleep apnea is very likely & an overnight polysomnogram will be scheduled as a split study. The pathophysiology of obstructive sleep apnea , it's cardiovascular consequences & modes of treatment including CPAP were discused with the patient in detail & they evidenced understanding.  Weight loss encouraged, compliance with goal of at least 4-6 hrs every night is the expectation. Advised against medications with sedative side effects Cautioned against driving when sleepy - understanding that sleepiness will vary on a day to day basis

## 2014-08-07 ENCOUNTER — Ambulatory Visit (INDEPENDENT_AMBULATORY_CARE_PROVIDER_SITE_OTHER): Payer: BC Managed Care – PPO | Admitting: Family Medicine

## 2014-08-07 ENCOUNTER — Ambulatory Visit (HOSPITAL_BASED_OUTPATIENT_CLINIC_OR_DEPARTMENT_OTHER)
Admission: RE | Admit: 2014-08-07 | Discharge: 2014-08-07 | Disposition: A | Discharge status: Home / Self Care | Payer: BC Managed Care – PPO | Source: Ambulatory Visit | Attending: Family Medicine | Admitting: Family Medicine

## 2014-08-07 ENCOUNTER — Encounter: Payer: Self-pay | Admitting: Family Medicine

## 2014-08-07 ENCOUNTER — Telehealth: Payer: Self-pay | Admitting: Internal Medicine

## 2014-08-07 VITALS — BP 122/80 | HR 65 | Temp 98.4°F | Wt 204.6 lb

## 2014-08-07 DIAGNOSIS — M79661 Pain in right lower leg: Secondary | ICD-10-CM

## 2014-08-07 NOTE — Telephone Encounter (Signed)
FYI

## 2014-08-07 NOTE — Progress Notes (Signed)
Pre visit review using our clinic review tool, if applicable. No additional management support is needed unless otherwise documented below in the visit note. 

## 2014-08-07 NOTE — Progress Notes (Signed)
   Subjective:    Patient ID: Timothy HammersGabriel G Tsan, male    DOB: 1941-04-06, 73 y.o.   MRN: [id number]  HPI Pt here c/o r low leg/calf pain.  Pt drove to [place] for Thanksgiving---wed and leg started hurting Wed/Thursday.  Pt with hx DVT in past.  Ibuprofen helps pain.     Review of Systems As above    Objective:   Physical Exam  BP 122/80 mmHg  Pulse 65  Temp(Src) 98.4 F (36.9 C) (Oral)  Wt 204 lb 9.6 oz (92.806 kg)  SpO2 95% General appearance: alert, cooperative, appears stated age and no distress Extremities: R Low ext-- + calf pain and rope like cord behind knee and upper calf. no errythema       Assessment & Plan:  1. Right calf pain R/o dvt---if any cp , sob develps, go to ER - US Venous Img Lower Unilateral Right; Future

## 2014-08-07 NOTE — Patient Instructions (Signed)

## 2014-08-07 NOTE — Telephone Encounter (Signed)
Pt seen today

## 2014-08-07 NOTE — Telephone Encounter (Signed)
Patient Information:  Caller Name: Vicente SereneGabriel  Phone: [phone]  Patient: Timothy Stone, Timothy Stone  Gender: Male  DOB: Apr 08, 1941  Age: [age] Years  PCP: Willow OraPaz, Jose  Office Follow Up:  Does the office need to follow up with this patient?: No  Instructions For The Office: N/A  RN Note:  [place], in office, for triage. Patient states he traveled 8 hours in a car on 08/01/14 and again on  08/04/14. Patient states he developed pain, similar to a " [place] Horse", in the lateral aspect of his right calf, extending to the lateral aspect of his right knee. Denies redness or swelling. States area is sensitive on palpation. Patient states he always has a cord like swelling/varicose vein in that area. Patient states ambulating improves the discomfort. Patient states pain increases when flexing his toes towards his nose. Patient states he has history of a DVT in his leg approx. 10-12 years ago. Denies chest pain or shortness of breath. No discoloration or pallor noted of foot. Care advice given per guidelines. Patient advised not to rub or massage area. Patient advised to continue Aspirin 325mg . daily, as directed. Patient advised may take Ibuprofen 600mg . q 6-8 hours, as needed for pain. Patient advised to apply heat to the area.  Call back parameters reviewed. Patient verbalizes understanding.  RN spoke with Cardell PeachGay, in office. Gay,in office, states for patient to be seen in the office at 0900 by Dr. Laury AxonLowne. Patient informed of above. Patient verbalizes understanding and agreeable.  Symptoms  Reason For Call & Symptoms: Right calf pain  Reviewed Health History In EMR: Yes  Reviewed Medications In EMR: Yes  Reviewed Allergies In EMR: Yes  Reviewed Surgeries / Procedures: Yes  Date of Onset of Symptoms: 08/02/2014  Treatments Tried: Hot bath, Ibuprofen  Treatments Tried Worked: No  Guideline(s) Used:  Leg Pain  Disposition Per Guideline:   Go to ED Now (or to Office with PCP  Approval)  Reason For Disposition Reached:   Thigh or calf pain in only one leg and present > 1 hour  Advice Given:  Pain Medicines:  For pain relief, you can take either acetaminophen, ibuprofen, or naproxen.  Call Back If:  You become worse.  Patient Will Follow Care Advice:  YES  Appointment Scheduled:  08/07/2014 09:00:00 Appointment Scheduled Provider:  Lelon PerlaLowne, Yvonne R.

## 2014-08-10 ENCOUNTER — Other Ambulatory Visit: Payer: Self-pay

## 2014-08-10 ENCOUNTER — Telehealth: Payer: Self-pay | Admitting: Internal Medicine

## 2014-08-10 MED ORDER — BISOPROLOL-HYDROCHLOROTHIAZIDE 5-6.25 MG PO TABS: 2.0000 | ORAL_TABLET | Freq: Every day | ORAL | Status: DC

## 2014-08-10 NOTE — Telephone Encounter (Signed)
Caller name: Timothy Stone Relation to pt: self  Call back number: [phone] [facility]  Reason for call:    Patient states that he uses mail order pharmacy and the medication will not be here for another week and would like an emergency [facility]   Bisoprolol-hydrochlorothiazide

## 2014-08-10 NOTE — Telephone Encounter (Signed)
30 day supply [facility] to [facility]

## 2014-08-21 ENCOUNTER — Ambulatory Visit (INDEPENDENT_AMBULATORY_CARE_PROVIDER_SITE_OTHER): Payer: BC Managed Care – PPO | Admitting: Internal Medicine

## 2014-08-21 ENCOUNTER — Encounter: Payer: Self-pay | Admitting: Internal Medicine

## 2014-08-21 VITALS — BP 120/71 | HR 69 | Temp 97.8°F | Wt 206.1 lb

## 2014-08-21 DIAGNOSIS — I809 Phlebitis and thrombophlebitis of unspecified site: Secondary | ICD-10-CM

## 2014-08-21 DIAGNOSIS — I10 Essential (primary) hypertension: Secondary | ICD-10-CM

## 2014-08-21 DIAGNOSIS — R739 Hyperglycemia, unspecified: Secondary | ICD-10-CM

## 2014-08-21 MED ORDER — DOXYCYCLINE HYCLATE 100 MG PO TABS: 100.0000 mg | ORAL_TABLET | Freq: Two times a day (BID) | ORAL | Status: DC

## 2014-08-21 NOTE — Progress Notes (Signed)
Subjective:    Patient ID: Timothy Stone, male    DOB: 07-Sep-1941, 73 y.o.   MRN: [id number]  DOS:  08/21/2014 Type of visit - description : f/u Interval history: Recently seen with a superficial phlebitis diagnosed via ultrasound. No DVT. Note from Dr. Redmond BasemanLawne reviewed. Pain and swelling decrease except in one area around the knee. He has been using aspirin and local heat consistently Hypertension, good compliance with medications. No apparent side effects.  ROS  Denies chest pain, difficulty breathing ; no palpitations.  Past Medical History  Diagnosis Date  . Umbilical hernia     no surgery  . DVT (deep venous thrombosis) 2005    no know triggers per pt  . BPH (benign prostatic hyperplasia)     s/p TUNA  . HTN (hypertension)     dx in the 70s  . OSA (obstructive sleep apnea)     on CPAP  . Recurrent UTI   . Actinic keratosis     hyperplastic aktinic keratosis forehead s/p surgery Dr Park LiterAlbertini    Past Surgical History  Procedure Laterality Date  . Prostate surgery      TUNA around 2003    History   Social History  . Marital Status: Married    Spouse Name: N/A    Number of Children: 4  . Years of Education: N/A   Occupational History  . 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 Smoking status: Never Smoker   . Smokeless tobacco: Never Used  . Alcohol Use: 0.0 oz/week    0 Not specified per week     Comment: 2x per week  . Drug Use: No  . Sexual Activity: Not on file   Other Topics Concern  . Not on file   Social History Narrative                    Medication List       This list is accurate as of: 08/21/14 11:59 PM.  Always use your most recent med list.               acetaminophen 500 MG tablet  Commonly known as:  TYLENOL  Take 500 mg by mouth as needed.     aspirin 325 MG tablet  Take 325 mg by mouth daily.     bisoprolol-hydrochlorothiazide 5-6.25 MG  per tablet  Commonly known as:  ZIAC  Take 2 tablets by mouth daily.     doxazosin 4 MG tablet  Commonly known as:  CARDURA  TAKE 1 TABLET BY MOUTH AT BEDTIME.     doxycycline 100 MG tablet  Commonly known as:  VIBRA-TABS  Take 1 tablet (100 mg total) by mouth 2 (two) times daily.     fluticasone 50 MCG/ACT nasal spray  Commonly known as:  FLONASE  USE 2 SPRAYS NASALLY DAILY     Ibuprofen 200 MG Caps  Take by mouth as needed.           Objective:   Physical Exam  Musculoskeletal:       Legs:  BP 120/71 mmHg  Pulse 69  Temp(Src) 97.8 F (36.6 C) (Oral)  Wt 206 lb 2 oz (93.498 kg)  SpO2 98% General -- alert, well-developed, NAD.   Lungs -- normal respiratory effort, no intercostal retractions, no accessory muscle use, and normal breath sounds.  Heart-- normal rate, regular rhythm, no murmur.  Extremities-- no  pretibial edema bilaterally; calves symmetric and not TTP, see graphic  Neurologic--  alert & oriented X3. Speech normal, gait appropriate for age, strength symmetric and appropriate for age.  Psych-- Cognition and judgment appear intact. Cooperative with normal attention span and concentration. No anxious or depressed appearing.       Assessment & Plan:  Today , I spent more than  25  min with the patient: >50% of the time counseling regards DVT prevention, reviewing a note from another provided and ultrasound results.

## 2014-08-21 NOTE — Progress Notes (Signed)
Pre visit review using our clinic review tool, if applicable. No additional management support is needed unless otherwise documented below in the visit note. 

## 2014-08-21 NOTE — Patient Instructions (Signed)
Get your blood work before you leave   Continue aspirin, local heat, take doxycycline for 1 week If the leg gets worse, you have chest pain, palpitations, difficulty breathing:   please call or see a doctor. Come back in 2 weeks if not completely back to normal   Please come back to the office in 6 months  for a physical exam. Come back fasting

## 2014-08-22 ENCOUNTER — Other Ambulatory Visit (INDEPENDENT_AMBULATORY_CARE_PROVIDER_SITE_OTHER): Payer: BC Managed Care – PPO

## 2014-08-22 DIAGNOSIS — I809 Phlebitis and thrombophlebitis of unspecified site: Secondary | ICD-10-CM | POA: Insufficient documentation

## 2014-08-22 DIAGNOSIS — I10 Essential (primary) hypertension: Secondary | ICD-10-CM

## 2014-08-22 LAB — HEMOGLOBIN A1C: HEMOGLOBIN A1C: 6.2 % (ref 4.6–6.5)

## 2014-08-22 NOTE — Assessment & Plan Note (Signed)
Hypertension, continue with Ziac, currently without. Check a BMP

## 2014-08-22 NOTE — Assessment & Plan Note (Signed)
Superficial phlebitis developed after a prolonged car trip. Feeling better, taking aspirin and local heat but symptoms are not completely resolved. He is planning to take another long drive, prevention discussed including continue taking aspirin, frequent stops, isometric exercises. ER if swelling, chest pain, palpitations or difficulty breathing. In addition to aspirin and local heat I will use doxycycline. Patient to call if not back to normal in 2 weeks

## 2014-08-23 LAB — BASIC METABOLIC PANEL
BUN: 21 mg/dL (ref 6–23)
CALCIUM: 9.1 mg/dL (ref 8.4–10.5)
CO2: 23 mEq/L (ref 19–32)
Chloride: 106 mEq/L (ref 96–112)
Creatinine, Ser: 1.1 mg/dL (ref 0.4–1.5)
GFR: 70.99 mL/min (ref >60.00)
Glucose, Bld: 93 mg/dL (ref 70–99)
POTASSIUM: 3.7 meq/L (ref 3.5–5.1)
Sodium: 139 mEq/L (ref 135–145)

## 2014-09-14 ENCOUNTER — Other Ambulatory Visit: Payer: Self-pay

## 2014-09-14 MED ORDER — DOXAZOSIN MESYLATE 4 MG PO TABS: ORAL_TABLET | ORAL | Status: DC

## 2014-09-14 MED ORDER — BISOPROLOL-HYDROCHLOROTHIAZIDE 5-6.25 MG PO TABS: 2.0000 | ORAL_TABLET | Freq: Every day | ORAL | Status: DC

## 2014-09-14 MED ORDER — FLUTICASONE PROPIONATE 50 MCG/ACT NA SUSP: NASAL | Status: DC

## 2014-12-12 ENCOUNTER — Other Ambulatory Visit: Payer: Self-pay

## 2015-01-03 ENCOUNTER — Telehealth: Payer: Self-pay | Admitting: Internal Medicine

## 2015-01-03 MED ORDER — DOXAZOSIN MESYLATE 4 MG PO TABS: 4.0000 mg | ORAL_TABLET | Freq: Every day | ORAL | Status: DC

## 2015-01-03 MED ORDER — BISOPROLOL-HYDROCHLOROTHIAZIDE 5-6.25 MG PO TABS: 2.0000 | ORAL_TABLET | Freq: Every day | ORAL | Status: DC

## 2015-01-03 NOTE — Telephone Encounter (Signed)
Rx's sent to [facility].

## 2015-01-03 NOTE — Telephone Encounter (Signed)
Caller name: Jebediah Relation to pt: self Call back number: [phone] [facility]  Reason for call:   Requesting refills on doxazosine 4mg  and bisoprolol/hctz. 90 day supply P: [phone]

## 2015-01-08 ENCOUNTER — Other Ambulatory Visit: Payer: Self-pay

## 2015-01-08 ENCOUNTER — Telehealth: Payer: Self-pay | Admitting: Internal Medicine

## 2015-01-08 MED ORDER — DOXAZOSIN MESYLATE 4 MG PO TABS: 4.0000 mg | ORAL_TABLET | Freq: Every day | ORAL | Status: DC

## 2015-01-08 MED ORDER — BISOPROLOL-HYDROCHLOROTHIAZIDE 5-6.25 MG PO TABS: 2.0000 | ORAL_TABLET | Freq: Every day | ORAL | Status: DC

## 2015-01-08 NOTE — Telephone Encounter (Signed)
Doxazosin and Bisoprolol sent to [facility] as requested.

## 2015-01-08 NOTE — Telephone Encounter (Signed)
WANTS NEW RX FOR BIXOPROLOL FUMARATE HC TZ TAB 5-6.25 2 TAB DAILY DOXAZOSIN 4MG  1 TABLET PER DAY  HE HAS BEEN HAVING TROUBLE WITH [facility] AND WOULD LIKE TO SEND THESE TO THE [facility] FOR 30 DAYS

## 2015-01-16 ENCOUNTER — Telehealth: Payer: Self-pay | Admitting: Internal Medicine

## 2015-01-16 NOTE — Telephone Encounter (Signed)
Pre Visit letter sent  °

## 2015-01-16 NOTE — Telephone Encounter (Signed)
That should be fine. 

## 2015-01-16 NOTE — Telephone Encounter (Signed)
PT has CPE scheduled on 02/06/15 with a 6 month follow up on 02/20/15- can I cancel the follow up since the PT will be here 2 weeks prior?

## 2015-02-05 ENCOUNTER — Telehealth: Payer: Self-pay | Admitting: *Deleted

## 2015-02-05 ENCOUNTER — Other Ambulatory Visit: Payer: Self-pay | Admitting: Internal Medicine

## 2015-02-05 ENCOUNTER — Encounter: Payer: Self-pay | Admitting: *Deleted

## 2015-02-05 NOTE — Telephone Encounter (Signed)
Pre-Visit Call completed with patient and chart updated.   Pre-Visit Info documented in Specialty Comments under SnapShot.    

## 2015-02-06 ENCOUNTER — Ambulatory Visit (INDEPENDENT_AMBULATORY_CARE_PROVIDER_SITE_OTHER): Payer: Commercial Managed Care - HMO | Admitting: Internal Medicine

## 2015-02-06 ENCOUNTER — Encounter: Payer: Self-pay | Admitting: Internal Medicine

## 2015-02-06 VITALS — BP 116/78 | HR 59 | Temp 98.0°F | Ht 66.0 in | Wt 203.5 lb

## 2015-02-06 DIAGNOSIS — K429 Umbilical hernia without obstruction or gangrene: Secondary | ICD-10-CM

## 2015-02-06 DIAGNOSIS — N400 Enlarged prostate without lower urinary tract symptoms: Secondary | ICD-10-CM

## 2015-02-06 DIAGNOSIS — Z0000 Encounter for general adult medical examination without abnormal findings: Secondary | ICD-10-CM

## 2015-02-06 DIAGNOSIS — R739 Hyperglycemia, unspecified: Secondary | ICD-10-CM

## 2015-02-06 DIAGNOSIS — I10 Essential (primary) hypertension: Secondary | ICD-10-CM

## 2015-02-06 DIAGNOSIS — R7303 Prediabetes: Secondary | ICD-10-CM

## 2015-02-06 MED ORDER — BISOPROLOL-HYDROCHLOROTHIAZIDE 5-6.25 MG PO TABS: 2.0000 | ORAL_TABLET | Freq: Every day | ORAL | Status: DC

## 2015-02-06 MED ORDER — DOXAZOSIN MESYLATE 4 MG PO TABS: 4.0000 mg | ORAL_TABLET | Freq: Every day | ORAL | Status: DC

## 2015-02-06 NOTE — Progress Notes (Signed)
Subjective:    Patient ID: Timothy Stone, male    DOB: May 21, 1941, 74 y.o.   MRN: [id number]  DOS:  02/06/2015 Type of visit - description :   Here for Medicare AWV:  1. Risk factors based on Past M, S, F history: reviewed 2. Physical Activities:  Goes to the gym regularly ~ 4-5/week  3. Depression/mood: neg screening  4. 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 ADL's: independent, drives  6. Fall Risk: no recent falls, prevention discussed , see AVS 7. home Safety: does feel safe at home  8. Height, weight, & visual acuity: see VS,  rec to see eye doctor regularly  9. Counseling: provided 10. Labs ordered based on risk factors: if needed  11. Referral Coordination: if needed 12. Care Plan, see assessment and plan , written personalized plan provided , see AVS 13. Cognitive Assessment: motor skills and cognition appropriate for age 56. Care team updated, see Snap Shot  15. End-of-life care discussed   In addition, today we discussed the following:  hypertension, good medication compliance  amb BPs normal, one time BP was 99, slightly low but he was asymptomatic. History of DVT, most recently had a superficial phlebitis,  Taking aspirin,feels better   BPH, need a referal to urology   Review of Systems Constitutional: No fever. No chills. No unexplained wt changes. No unusual sweats  HEENT: No dental problems, no ear discharge, no facial swelling, no voice changes. No eye discharge, no eye  redness , no  intolerance to light   Respiratory: No wheezing , no  difficulty breathing. No cough , no mucus production  Cardiovascular: No CP, no leg swelling , no  Palpitations  GI:  Apparently was exposed to listeria few weeks ago, got a letter from [facility] Did experience very mild abdominal pain and nausea but symptoms self resolved. Currently asymptomatic.  No blood in the stools. No dysphagia, no odynophagia    Endocrine: No  polyphagia, no polyuria , no polydipsia  GU: No dysuria, gross hematuria, difficulty urinating. No urinary urgency, no frequency.  Musculoskeletal: No joint swellings or unusual aches or pains  Skin: No change in the color of the skin, palor , no  Rash  Allergic, immunologic: occ nasal congestion, no  food allergies  Neurological: No dizziness no  syncope. No headaches. No diplopia, no slurred, no slurred speech, no motor deficits, no facial  Numbness  Hematological: No enlarged lymph nodes, no easy bruising , no unusual bleedings  Psychiatry: No suicidal ideas, no hallucinations, no beavior problems, no confusion.  No unusual/severe anxiety, no depression    Past Medical History  Diagnosis Date  . Umbilical hernia     no surgery  . DVT (deep venous thrombosis) 2005    no know triggers per pt  . BPH (benign prostatic hyperplasia)     s/p TUNA  . HTN (hypertension)     dx in the 70s  . OSA (obstructive sleep apnea)     on CPAP  . Recurrent UTI   . Actinic keratosis     hyperplastic aktinic keratosis forehead s/p surgery Dr Park Liter    Past Surgical History  Procedure Laterality Date  . Prostate surgery      TUNA around 2003    History   Social History  . Marital Status: Married    Spouse Name: N/A  . Number of Children: 4  . Years of Education: N/A  Occupational History  . 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 Smoking status: Never Smoker   . Smokeless tobacco: Never Used  . Alcohol Use: 0.0 oz/week    0 Standard drinks or equivalent per week     Comment: 2x per week  . Drug Use: No  . Sexual Activity: Not on file   Other Topics Concern  . Not on file   Social History Narrative   Lives w/ wife              Family History  Problem Relation Age of Onset  . Diabetes Mother     Judie Petit, sister  . Heart attack Father 61  . Breast cancer Sister   . Rectal cancer Sister 16      @ time of her first Cscope  . Prostate cancer Neg Hx   . Lung cancer Brother 54    smoker   . Prostate cancer Neg Hx        Medication List       This list is accurate as of: 02/06/15 11:59 PM.  Always use your most recent med list.               acetaminophen 500 MG tablet  Commonly known as:  TYLENOL  Take 500 mg by mouth as needed.     aspirin 325 MG tablet  Take 325 mg by mouth daily.     bisoprolol-hydrochlorothiazide 5-6.25 MG per tablet  Commonly known as:  ZIAC  Take 2 tablets by mouth daily.     doxazosin 4 MG tablet  Commonly known as:  CARDURA  Take 1 tablet (4 mg total) by mouth at bedtime.     EXCEDRIN PO  Take 1 tablet by mouth daily as needed (Headaches).     fluticasone 50 MCG/ACT nasal spray  Commonly known as:  FLONASE  Place 2 sprays into both nostrils daily.     Ibuprofen 200 MG Caps  Take by mouth as needed.           Objective:   Physical Exam BP 116/78 mmHg  Pulse 59  Temp(Src) 98 F (36.7 C) (Oral)  Ht  (1.676 m)  Wt 203 lb 8 oz (92.307 kg)  BMI 32.86 kg/m2  SpO2 97%  General:   Well developed, well nourished . NAD.  HEENT:  Normocephalic . Face symmetric, atraumatic. Normal carotid pulses, no thyromegaly Lungs:  CTA B Normal respiratory effort, no intercostal retractions, no accessory muscle use. Heart: RRR,  no murmur.  no pretibial edema bilaterally  Abdomen:  Not distended, soft, non-tender. No rebound or rigidity. No mass,organomegaly, no bruit. Does have a reducible, slightly tender umbilical hernia. Extremities: No edema, few varicose veins without phlebitis Skin: Not pale. Not jaundice Neurologic:  alert & oriented X3.  Speech normal, gait appropriate for age and unassisted Psych--  Cognition and judgment appear intact.  Cooperative with normal attention span and concentration.  Behavior appropriate. No anxious or depressed appearing.      Assessment & Plan:     Hypertension, good compliance of  medication, BP has been low at least one or 2 times, recommend to watch BPs, refill medicines, labs  BPH, needs a referral to urology, will arrange  Prediabetes, check an A1c, diet and exercise discussed  History of DVT remotely, most recently had a superficial phlebitis, on aspirin 325. Continue using compression stocking  Umbilical hernia, has seen surgery  before, offered surgery but declined, recommend to watch for complications.  Return to clinic 1 year as long as he feels well and BP is well-controlled

## 2015-02-06 NOTE — Progress Notes (Signed)
Pre visit review using our clinic review tool, if applicable. No additional management support is needed unless otherwise documented below in the visit note. 

## 2015-02-06 NOTE — Assessment & Plan Note (Signed)
Td 2015 pneumonia shot 2009 prevnar 02-2014 Shingles immunization --2011  Ha a Cscope @ [facility] 11-08-2001 Dr Loman Chromanhoton: small internal Hemorrhoids Repeated Cscope 05-12-2012, 1 polyp, path report not available , next per Dr Loman Chromanhoton  (5 years per pt?)  DRE PSA per urology  Diet-exercise discussed

## 2015-02-06 NOTE — Patient Instructions (Addendum)
Please schedule labs to be done within few days (fasting)  Check the  blood pressure 2 or 3 times a  Week   Be sure your blood pressure is between 110/65 and  145/85.  if it is consistently higher or lower, let me know        Fall Prevention and Home Safety Falls cause injuries and can affect all age groups. It is possible to use preventive measures to significantly decrease the likelihood of falls. There are many simple measures which can make your home safer and prevent falls. OUTDOORS  Repair cracks and edges of walkways and driveways.  Remove high doorway thresholds.  Trim shrubbery on the main path into your home.  Have good outside lighting.  Clear walkways of tools, rocks, debris, and clutter.  Check that handrails are not broken and are securely fastened. Both sides of steps should have handrails.  Have leaves, snow, and ice cleared regularly.  Use sand or salt on walkways during winter months.  In the garage, clean up grease or oil spills. BATHROOM  Install night lights.  Install grab bars by the toilet and in the tub and shower.  Use non-skid mats or decals in the tub or shower.  Place a plastic non-slip stool in the shower to sit on, if needed.  Keep floors dry and clean up all water on the floor immediately.  Remove soap buildup in the tub or shower on a regular basis.  Secure bath mats with non-slip, double-sided rug tape.  Remove throw rugs and tripping hazards from the floors. BEDROOMS  Install night lights.  Make sure a bedside light is easy to reach.  Do not use oversized bedding.  Keep a telephone by your bedside.  Have a firm chair with side arms to use for getting dressed.  Remove throw rugs and tripping hazards from the floor. KITCHEN  Keep handles on pots and pans turned toward the center of the stove. Use back burners when possible.  Clean up spills quickly and allow time for drying.  Avoid walking on wet floors.  Avoid  hot utensils and knives.  Position shelves so they are not too high or low.  Place commonly used objects within easy reach.  If necessary, use a sturdy step stool with a grab bar when reaching.  Keep electrical cables out of the way.  Do not use floor polish or wax that makes floors slippery. If you must use wax, use non-skid floor wax.  Remove throw rugs and tripping hazards from the floor. STAIRWAYS  Never leave objects on stairs.  Place handrails on both sides of stairways and use them. Fix any loose handrails. Make sure handrails on both sides of the stairways are as long as the stairs.  Check carpeting to make sure it is firmly attached along stairs. Make repairs to worn or loose carpet promptly.  Avoid placing throw rugs at the top or bottom of stairways, or properly secure the rug with carpet tape to prevent slippage. Get rid of throw rugs, if possible.  Have an electrician put in a light switch at the top and bottom of the stairs. OTHER FALL PREVENTION TIPS  Wear low-heel or rubber-soled shoes that are supportive and fit well. Wear closed toe shoes.  When using a stepladder, make sure it is fully opened and both spreaders are firmly locked. Do not climb a closed stepladder.  Add color or contrast paint or tape to grab bars and handrails in your home. Place  contrasting color strips on first and last steps.  Learn and use mobility aids as needed. Install an electrical emergency response system.  Turn on lights to avoid dark areas. Replace light bulbs that burn out immediately. Get light switches that glow.  Arrange furniture to create clear pathways. Keep furniture in the same place.  Firmly attach carpet with non-skid or double-sided tape.  Eliminate uneven floor surfaces.  Select a carpet pattern that does not visually hide the edge of steps.  Be aware of all pets. OTHER HOME SAFETY TIPS  Set the water temperature for 120 F (48.8 C).  Keep emergency numbers  on or near the telephone.  Keep smoke detectors on every level of the home and near sleeping areas. Document Released: 08/14/2002 Document Revised: 02/23/2012 Document Reviewed: 11/13/2011 [facility] Patient Information 2015 [place]. This information is not intended to replace advice given to you by your health care provider. Make sure you discuss any questions you have with your health care provider.   Preventive Care for Adults Ages 865 and over  Blood pressure check.** / Every 1 to 2 years.  Lipid and cholesterol check.**/ Every 5 years beginning at age 75.  Lung cancer screening. / Every year if you are aged 55-80 years and have a 30-pack-year history of smoking and currently smoke or have quit within the past 15 years. Yearly screening is stopped once you have quit smoking for at least 15 years or develop a health problem that would prevent you from having lung cancer treatment.  Fecal occult blood test (FOBT) of stool. / Every year beginning at age 75 and continuing until age 75. You may not have to do this test if you get a colonoscopy every 10 years.  Flexible sigmoidoscopy** or colonoscopy.** / Every 5 years for a flexible sigmoidoscopy or every 10 years for a colonoscopy beginning at age 75 and continuing until age 75.  Hepatitis C blood test.** / For all people born from [id number] through 1965 and any individual with known risks for hepatitis C.  Abdominal aortic aneurysm (AAA) screening.** / A one-time screening for ages 7465 to 5375 years who are current or former smokers.  Skin self-exam. / Monthly.  Influenza vaccine. / Every year.  Tetanus, diphtheria, and acellular pertussis (Tdap/Td) vaccine.** / 1 dose of Td every 10 years.  Varicella vaccine.** / Consult your health care provider.  Zoster vaccine.** / 1 dose for adults aged 75 years or older.  Pneumococcal 13-valent conjugate (PCV13) vaccine.** / Consult your health care provider.  Pneumococcal polysaccharide  (PPSV23) vaccine.** / 1 dose for all adults aged 75 years and older.  Meningococcal vaccine.** / Consult your health care provider.  Hepatitis A vaccine.** / Consult your health care provider.  Hepatitis B vaccine.** / Consult your health care provider.  Haemophilus influenzae type b (Hib) vaccine.** / Consult your health care provider. **Family history and personal history of risk and conditions may change your health care provider's recommendations. Document Released: 10/20/2001 Document Revised: 08/29/2013 Document Reviewed: 01/19/2011 [facility] Patient Information 2015 [place], [place]. This information is not intended to replace advice given to you by your health care provider. Make sure you discuss any questions you have with your health care provider.

## 2015-02-11 ENCOUNTER — Other Ambulatory Visit: Payer: Commercial Managed Care - HMO

## 2015-02-19 ENCOUNTER — Other Ambulatory Visit (INDEPENDENT_AMBULATORY_CARE_PROVIDER_SITE_OTHER): Payer: Commercial Managed Care - HMO

## 2015-02-19 DIAGNOSIS — R7309 Other abnormal glucose: Secondary | ICD-10-CM

## 2015-02-19 DIAGNOSIS — R7303 Prediabetes: Secondary | ICD-10-CM

## 2015-02-19 DIAGNOSIS — I10 Essential (primary) hypertension: Secondary | ICD-10-CM | POA: Diagnosis not present

## 2015-02-19 LAB — LIPID PANEL
Cholesterol: 156 mg/dL (ref 0–200)
HDL: 26.1 mg/dL — ABNORMAL LOW (ref >39.00)
NONHDL: 129.9
TRIGLYCERIDES: 281 mg/dL — AB (ref 0.0–149.0)
Total CHOL/HDL Ratio: 6
VLDL: 56.2 mg/dL — ABNORMAL HIGH (ref 0.0–40.0)

## 2015-02-19 LAB — COMPREHENSIVE METABOLIC PANEL
ALT: 23 U/L (ref 0–53)
AST: 21 U/L (ref 0–37)
Albumin: 4 g/dL (ref 3.5–5.2)
Alkaline Phosphatase: 54 U/L (ref 39–117)
BILIRUBIN TOTAL: 0.5 mg/dL (ref 0.2–1.2)
BUN: 21 mg/dL (ref 6–23)
CALCIUM: 9.2 mg/dL (ref 8.4–10.5)
CO2: 25 meq/L (ref 19–32)
Chloride: 103 mEq/L (ref 96–112)
Creatinine, Ser: 1.16 mg/dL (ref 0.40–1.50)
GFR: 65.29 mL/min (ref >60.00)
GLUCOSE: 137 mg/dL — AB (ref 70–99)
Potassium: 3.8 mEq/L (ref 3.5–5.1)
Sodium: 136 mEq/L (ref 135–145)
TOTAL PROTEIN: 7.1 g/dL (ref 6.0–8.3)

## 2015-02-19 LAB — CBC WITH DIFFERENTIAL/PLATELET
BASOS ABS: 0 10*3/uL (ref 0.0–0.1)
BASOS PCT: 0.5 % (ref 0.0–3.0)
EOS ABS: 0.1 10*3/uL (ref 0.0–0.7)
Eosinophils Relative: 1.3 % (ref 0.0–5.0)
HCT: 43.1 % (ref 39.0–52.0)
Hemoglobin: 14.7 g/dL (ref 13.0–17.0)
LYMPHS PCT: 38.4 % (ref 12.0–46.0)
Lymphs Abs: 2.5 10*3/uL (ref 0.7–4.0)
MCHC: 34.2 g/dL (ref 30.0–36.0)
MCV: 86.9 fl (ref 78.0–100.0)
MONO ABS: 0.3 10*3/uL (ref 0.1–1.0)
Monocytes Relative: 5.2 % (ref 3.0–12.0)
NEUTROS ABS: 3.5 10*3/uL (ref 1.4–7.7)
Neutrophils Relative %: 54.6 % (ref 43.0–77.0)
Platelets: 262 10*3/uL (ref 150.0–400.0)
RBC: 4.96 Mil/uL (ref 4.22–5.81)
RDW: 14.1 % (ref 11.5–15.5)
WBC: 6.4 10*3/uL (ref 4.0–10.5)

## 2015-02-19 LAB — LDL CHOLESTEROL, DIRECT: LDL DIRECT: 90 mg/dL

## 2015-02-19 LAB — HEMOGLOBIN A1C: HEMOGLOBIN A1C: 6 % (ref 4.6–6.5)

## 2015-02-20 ENCOUNTER — Ambulatory Visit: Payer: BC Managed Care – PPO | Admitting: Internal Medicine

## 2015-05-29 LAB — PSA: PSA: 2.36

## 2015-07-02 ENCOUNTER — Ambulatory Visit (INDEPENDENT_AMBULATORY_CARE_PROVIDER_SITE_OTHER): Payer: Commercial Managed Care - HMO | Admitting: Behavioral Health

## 2015-07-02 DIAGNOSIS — Z23 Encounter for immunization: Secondary | ICD-10-CM

## 2015-07-02 NOTE — Progress Notes (Signed)
Pre visit review using our clinic review tool, if applicable. No additional management support is needed unless otherwise documented below in the visit note. 

## 2015-11-23 ENCOUNTER — Other Ambulatory Visit: Payer: Self-pay | Admitting: Internal Medicine

## 2016-02-04 ENCOUNTER — Other Ambulatory Visit: Payer: Self-pay | Admitting: Internal Medicine

## 2016-02-11 ENCOUNTER — Encounter: Payer: Self-pay | Admitting: Internal Medicine

## 2016-02-11 ENCOUNTER — Ambulatory Visit (INDEPENDENT_AMBULATORY_CARE_PROVIDER_SITE_OTHER): Payer: Commercial Managed Care - HMO | Admitting: Internal Medicine

## 2016-02-11 VITALS — BP 124/72 | HR 59 | Temp 97.6°F | Ht 67.0 in | Wt 209.0 lb

## 2016-02-11 DIAGNOSIS — N400 Enlarged prostate without lower urinary tract symptoms: Secondary | ICD-10-CM | POA: Diagnosis not present

## 2016-02-11 DIAGNOSIS — R739 Hyperglycemia, unspecified: Secondary | ICD-10-CM | POA: Diagnosis not present

## 2016-02-11 DIAGNOSIS — I10 Essential (primary) hypertension: Secondary | ICD-10-CM

## 2016-02-11 DIAGNOSIS — Z0000 Encounter for general adult medical examination without abnormal findings: Secondary | ICD-10-CM | POA: Diagnosis not present

## 2016-02-11 DIAGNOSIS — K429 Umbilical hernia without obstruction or gangrene: Secondary | ICD-10-CM | POA: Diagnosis not present

## 2016-02-11 LAB — BASIC METABOLIC PANEL
BUN: 32 mg/dL — AB (ref 6–23)
CALCIUM: 9.1 mg/dL (ref 8.4–10.5)
CO2: 26 meq/L (ref 19–32)
CREATININE: 1.32 mg/dL (ref 0.40–1.50)
Chloride: 104 mEq/L (ref 96–112)
GFR: 56.09 mL/min — ABNORMAL LOW (ref >60.00)
GLUCOSE: 132 mg/dL — AB (ref 70–99)
Potassium: 3.8 mEq/L (ref 3.5–5.1)
Sodium: 138 mEq/L (ref 135–145)

## 2016-02-11 LAB — LIPID PANEL
Cholesterol: 163 mg/dL (ref 0–200)
HDL: 28 mg/dL — AB (ref >39.00)
NONHDL: 134.91
Total CHOL/HDL Ratio: 6
Triglycerides: 318 mg/dL — ABNORMAL HIGH (ref 0.0–149.0)
VLDL: 63.6 mg/dL — ABNORMAL HIGH (ref 0.0–40.0)

## 2016-02-11 LAB — HEMOGLOBIN A1C: HEMOGLOBIN A1C: 6.1 % (ref 4.6–6.5)

## 2016-02-11 LAB — LDL CHOLESTEROL, DIRECT: LDL DIRECT: 96 mg/dL

## 2016-02-11 NOTE — Progress Notes (Signed)
Pre visit review using our clinic review tool, if applicable. No additional management support is needed unless otherwise documented below in the visit note. 

## 2016-02-11 NOTE — Assessment & Plan Note (Addendum)
Td 2015 pneumonia shot 2009 prevnar 02-2014 Shingles immunization --2011  Ha a Cscope @ [facility] 11-08-2001 Dr Rhoton: small internal Hemorrhoids Repeated Cscope 05-12-2012, 1 polyp, path report not available , next per Dr Rhoton  (5 years per pt?)  DRE PSA per urology  Diet-exercise discussed     

## 2016-02-11 NOTE — Patient Instructions (Signed)
Get your blood work before you leave   Next visit in 1 year, fasting     Fall Prevention and Home Safety Falls cause injuries and can affect all age groups. It is possible to use preventive measures to significantly decrease the likelihood of falls. There are many simple measures which can make your home safer and prevent falls. OUTDOORS  Repair cracks and edges of walkways and driveways.  Remove high doorway thresholds.  Trim shrubbery on the main path into your home.  Have good outside lighting.  Clear walkways of tools, rocks, debris, and clutter.  Check that handrails are not broken and are securely fastened. Both sides of steps should have handrails.  Have leaves, snow, and ice cleared regularly.  Use sand or salt on walkways during winter months.  In the garage, clean up grease or oil spills. BATHROOM  Install night lights.  Install grab bars by the toilet and in the tub and shower.  Use non-skid mats or decals in the tub or shower.  Place a plastic non-slip stool in the shower to sit on, if needed.  Keep floors dry and clean up all water on the floor immediately.  Remove soap buildup in the tub or shower on a regular basis.  Secure bath mats with non-slip, double-sided rug tape.  Remove throw rugs and tripping hazards from the floors. BEDROOMS  Install night lights.  Make sure a bedside light is easy to reach.  Do not use oversized bedding.  Keep a telephone by your bedside.  Have a firm chair with side arms to use for getting dressed.  Remove throw rugs and tripping hazards from the floor. KITCHEN  Keep handles on pots and pans turned toward the center of the stove. Use back burners when possible.  Clean up spills quickly and allow time for drying.  Avoid walking on wet floors.  Avoid hot utensils and knives.  Position shelves so they are not too high or low.  Place commonly used objects within easy reach.  If necessary, use a sturdy step  stool with a grab bar when reaching.  Keep electrical cables out of the way.  Do not use floor polish or wax that makes floors slippery. If you must use wax, use non-skid floor wax.  Remove throw rugs and tripping hazards from the floor. STAIRWAYS  Never leave objects on stairs.  Place handrails on both sides of stairways and use them. Fix any loose handrails. Make sure handrails on both sides of the stairways are as long as the stairs.  Check carpeting to make sure it is firmly attached along stairs. Make repairs to worn or loose carpet promptly.  Avoid placing throw rugs at the top or bottom of stairways, or properly secure the rug with carpet tape to prevent slippage. Get rid of throw rugs, if possible.  Have an electrician put in a light switch at the top and bottom of the stairs. OTHER FALL PREVENTION TIPS  Wear low-heel or rubber-soled shoes that are supportive and fit well. Wear closed toe shoes.  When using a stepladder, make sure it is fully opened and both spreaders are firmly locked. Do not climb a closed stepladder.  Add color or contrast paint or tape to grab bars and handrails in your home. Place contrasting color strips on first and last steps.  Learn and use mobility aids as needed. Install an electrical emergency response system.  Turn on lights to avoid dark areas. Replace light bulbs that burn out  immediately. Get light switches that glow.  Arrange furniture to create clear pathways. Keep furniture in the same place.  Firmly attach carpet with non-skid or double-sided tape.  Eliminate uneven floor surfaces.  Select a carpet pattern that does not visually hide the edge of steps.  Be aware of all pets. OTHER HOME SAFETY TIPS  Set the water temperature for 120 F (48.8 C).  Keep emergency numbers on or near the telephone.  Keep smoke detectors on every level of the home and near sleeping areas. Document Released: 08/14/2002 Document Revised: 02/23/2012  Document Reviewed: 11/13/2011 [facility] Patient Information 2015 [place], [place]. This information is not intended to replace advice given to you by your health care provider. Make sure you discuss any questions you have with your health care provider.   Preventive Care for Adults Ages 57 and over  Blood pressure check.** / Every 1 to 2 years.  Lipid and cholesterol check.**/ Every 5 years beginning at age 6.  Lung cancer screening. / Every year if you are aged 71-80 years and have a 30-pack-year history of smoking and currently smoke or have quit within the past 15 years. Yearly screening is stopped once you have quit smoking for at least 15 years or develop a health problem that would prevent you from having lung cancer treatment.  Fecal occult blood test (FOBT) of stool. / Every year beginning at age 6 and continuing until age 28. You may not have to do this test if you get a colonoscopy every 10 years.  Flexible sigmoidoscopy** or colonoscopy.** / Every 5 years for a flexible sigmoidoscopy or every 10 years for a colonoscopy beginning at age 70 and continuing until age 19.  Hepatitis C blood test.** / For all people born from [birth year] through 1966 and any individual with known risks for hepatitis C.  Abdominal aortic aneurysm (AAA) screening.** / A one-time screening for ages 57 to 10 years who are current or former smokers.  Skin self-exam. / Monthly.  Influenza vaccine. / Every year.  Tetanus, diphtheria, and acellular pertussis (Tdap/Td) vaccine.** / 1 dose of Td every 10 years.  Varicella vaccine.** / Consult your health care provider.  Zoster vaccine.** / 1 dose for adults aged 23 years or older.  Pneumococcal 13-valent conjugate (PCV13) vaccine.** / Consult your health care provider.  Pneumococcal polysaccharide (PPSV23) vaccine.** / 1 dose for all adults aged 32 years and older.  Meningococcal vaccine.** / Consult your health care provider.  Hepatitis A vaccine.** /  Consult your health care provider.  Hepatitis B vaccine.** / Consult your health care provider.  Haemophilus influenzae type b (Hib) vaccine.** / Consult your health care provider. **Family history and personal history of risk and conditions may change your health care provider's recommendations. Document Released: 10/20/2001 Document Revised: 08/29/2013 Document Reviewed: 01/19/2011 [facility] Patient Information 2015 [place], [place]. This information is not intended to replace advice given to you by your health care provider. Make sure you discuss any questions you have with your health care provider.

## 2016-02-11 NOTE — Progress Notes (Signed)
Subjective:    Patient ID: Timothy Stone., male    DOB: [birth year], 75 y.o.   MRN: [id number]  DOS:  02/11/2016 Type of visit - description : CPX Interval history: In general feeling well. Prediabetes, exercising every other day at the gym, needs yo improve in his diet. HTN: Good compliance of medication, ambulatory BPs are very good BPH: Follow-up by urology yearly Umbilical hernia, has occasional discomfort.   Review of Systems  Constitutional: No fever. No chills. No unexplained wt changes. No unusual sweats  HEENT: No dental problems, no ear discharge, no facial swelling, no voice changes. No eye discharge, no eye  redness , no  intolerance to light . + Tinnitus for years, stable, no major problems with hearing loss  Respiratory: No wheezing , no  difficulty breathing. No cough , no mucus production  Cardiovascular: No CP, no leg swelling , no  Palpitations  GI: no nausea, no vomiting, no diarrhea , no  abdominal pain.  No blood in the stools. Very rarely has mild difficulty swallowing solids. No chronic heartburn.    Endocrine: No polyphagia, no polyuria , no polydipsia  GU: No dysuria, gross hematuria, difficulty urinating. No urinary urgency, no frequency.  Musculoskeletal: No joint swellings or unusual aches or pains  Skin: No change in the color of the skin, palor , no  Rash  Allergic, immunologic: No environmental allergies , no  food allergies  Neurological: No dizziness no  syncope. No headaches. No diplopia, no slurred, no slurred speech, no motor deficits, no facial  Numbness  Hematological: No enlarged lymph nodes, no easy bruising , no unusual bleedings  Psychiatry: No suicidal ideas, no hallucinations, no beavior problems, no confusion.  No unusual/severe anxiety, no depression   Past Medical History  Diagnosis Date  . Umbilical hernia     no surgery  . DVT (deep venous thrombosis) (HCC) 2005    no know triggers per pt  . BPH (benign prostatic  hyperplasia)     s/p TUNA  . HTN (hypertension)     dx in the 70s  . OSA (obstructive sleep apnea)     on CPAP  . Recurrent UTI   . Actinic keratosis     hyperplastic aktinic keratosis forehead s/p surgery Dr Park Liter  . Chronic cystitis     Dr. Patsi Sears  . Vitamin D deficiency     Dr. Patsi Sears    Past Surgical History  Procedure Laterality Date  . Prostate surgery      TUNA around 2003    Social History   Social History  . Marital Status: Married    Spouse Name: N/A  . Number of Children: 4  . Years of Education: N/A   Occupational History  . 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 Smoking status: Never Smoker   . Smokeless tobacco: Never Used  . Alcohol Use: 0.0 oz/week    0 Standard drinks or equivalent per week     Comment: 2x per week  . Drug Use: No  . Sexual Activity: Not on file   Other Topics Concern  . Not on file   Social History Narrative   Lives w/ wife              Family History  Problem Relation Age of Onset  . Diabetes Mother     Judie Petit, sister  . Heart attack Father 33  . Breast cancer  Sister   . Rectal cancer Sister [id number]    @ time of her first Cscope  . Prostate cancer Neg Hx   . Lung cancer Brother [id number]    smoker   . Prostate cancer Neg Hx        Medication List       This list is accurate as of: 02/11/16 11:59 PM.  Always use your most recent med list.               acetaminophen 500 MG tablet  Commonly known as:  TYLENOL  Take 500 mg by mouth as needed.     aspirin 325 MG tablet  Take 325 mg by mouth daily.     bisoprolol-hydrochlorothiazide 5-6.25 MG tablet  Commonly known as:  ZIAC  TAKE 2 TABLETS EVERY DAY     CORICIDIN D PO  Take 10 mg by mouth daily as needed.     Cranberry 300 MG tablet  Take 300 mg by mouth 3 (three) times daily.     doxazosin 4 MG tablet  Commonly known as:  CARDURA  TAKE 1 TABLET AT BEDTIME     EXCEDRIN  PO  Take 1 tablet by mouth daily as needed (Headaches).     Flaxseed Oil Oil  1,400 mg by Does not apply route daily.     fluticasone 50 MCG/ACT nasal spray  Commonly known as:  FLONASE  Place 2 sprays into both nostrils daily.     Ibuprofen 200 MG Caps  Take by mouth as needed.           Objective:   Physical Exam BP 124/72 mmHg  Pulse 59  Temp(Src) 97.6 F (36.4 C) (Oral)  Ht 5\' 7"  (1.702 m)  Wt 209 lb (94.802 kg)  BMI 32.73 kg/m2  SpO2 97%  General:   Well developed, well nourished . NAD.  Neck: No  thyromegaly  HEENT:  Normocephalic . Face symmetric, atraumatic Lungs:  CTA B Normal respiratory effort, no intercostal retractions, no accessory muscle use. Heart: RRR,  no murmur.  No pretibial edema bilaterally  Abdomen:  Not distended, soft, non-tender. No rebound or rigidity. + Reducible umbilical hernia, mild discomfort on palpation  Skin: Exposed areas without rash. Not pale. Not jaundice Neurologic:  alert & oriented X3.  Speech normal, gait appropriate for age and unassisted Strength symmetric and appropriate for age.  Psych: Cognition and judgment appear intact.  Cooperative with normal attention span and concentration.  Behavior appropriate. No anxious or depressed appearing.    Assessment & Plan:   Assessment: Prediabetes HTN BPH, recurrent UTIs, chronic cystitis OSA, on CPAP Umbilical hernia Actinic keratosis H/o vitamin D deficiency H/o DVT, 2005, unprovoked   PLAN: Prediabetes: Discussed diet, continue exercising as he is doing. Check a A1c and FLP HTN: Continue Ziac, Cardura. Well-controlled. Check a BMP BPH: Sees urology yearly Umbilical hernia: Occasionally has mild discomfort, recommend to see surgery if he changes his mind about surgery. Knows that as he grows older operative risk increases. Mild dysphagia: Observation for now Call for RFs as needed RTC one year

## 2016-02-12 DIAGNOSIS — Z09 Encounter for follow-up examination after completed treatment for conditions other than malignant neoplasm: Secondary | ICD-10-CM | POA: Insufficient documentation

## 2016-02-12 NOTE — Assessment & Plan Note (Signed)
Prediabetes: Discussed diet, continue exercising as he is doing. Check a A1c and FLP HTN: Continue Ziac, Cardura. Well-controlled. Check a BMP BPH: Sees urology yearly Umbilical hernia: Occasionally has mild discomfort, recommend to see surgery if he changes his mind about surgery. Knows that as he grows older operative risk increases. Mild dysphagia: Observation for now Call for RFs as needed RTC one year

## 2016-04-03 ENCOUNTER — Telehealth: Payer: Self-pay | Admitting: Behavioral Health

## 2016-04-03 NOTE — Telephone Encounter (Signed)
Appointment confirmed for Medicare Wellness visit on 04/06/16 at 1:00 PM w/ Eber Jones, RN.

## 2016-04-06 ENCOUNTER — Ambulatory Visit (INDEPENDENT_AMBULATORY_CARE_PROVIDER_SITE_OTHER): Payer: Commercial Managed Care - HMO

## 2016-04-06 VITALS — BP 120/72 | HR 58 | Resp 16 | Ht 67.0 in | Wt 207.6 lb

## 2016-04-06 DIAGNOSIS — Z0000 Encounter for general adult medical examination without abnormal findings: Secondary | ICD-10-CM

## 2016-04-06 NOTE — Progress Notes (Addendum)
Subjective:   Timothy Stone. is a 75 y.o. male who presents for Medicare Annual/Subsequent preventive examination.  Review of Systems:  No ROS.  Medicare Wellness Visit. Cardiac Risk Factors include: advanced age (>36men, >32 women);dyslipidemia;hypertension;male gender  Sleep patterns: No sleep issues, rarely gets up at night to void.    Home Safety/Smoke Alarms: Lives at home w/ wife, single-story home. Able to get in and out of shower w/o difficulty. Feels safe in home, has security system. Has smoke alarms. Living environment; residence and Firearm Safety: No firearms. Seat Belt Safety/Bike Helmet: Wears seat belt.   Counseling:   Eye Exam- Wears glasses, no vision issues. Due for eye exam, cannot remember name of eye doctor. Dental- Dr. Kerney Elbe every 6 months  Male:   CCS- last 05/12/12, internal hemorrhoids, 1 polyp removed-pathology not on file    PSA-   Lab Results  Component Value Date   PSA 2.36 05/29/2015       Objective:    Vitals: BP 120/72 (BP Location: Right Arm, Patient Position: Sitting, Cuff Size: Normal)   Pulse (!) 58   Resp 16   Ht 5\' 7"  (1.702 m)   Wt 207 lb 9.6 oz (94.2 kg)   SpO2 96%   BMI 32.51 kg/m   Body mass index is 32.51 kg/m.  Tobacco History  Smoking Status  . Never Smoker  Smokeless Tobacco  . Never Used     Counseling given: Not Answered   Past Medical History:  Diagnosis Date  . Actinic keratosis    hyperplastic aktinic keratosis forehead s/p surgery Dr Park Liter  . BPH (benign prostatic hyperplasia)    s/p TUNA  . Chronic cystitis    Dr. Patsi Sears  . DVT (deep venous thrombosis) (HCC) 2005   no know triggers per pt  . HTN (hypertension)    dx in the 70s  . OSA (obstructive sleep apnea)    on CPAP  . Recurrent UTI   . Umbilical hernia    no surgery  . Vitamin D deficiency    Dr. Patsi Sears   Past Surgical History:  Procedure Laterality Date  . PROSTATE SURGERY     TUNA around 2003  . PROSTATE SURGERY   10-12 years ago   Family History  Problem Relation Age of Onset  . Diabetes Mother     Judie Petit, sister  . Heart attack Father 12  . Breast cancer Sister   . Rectal cancer Sister 54    @ time of her first Cscope  . Lung cancer Brother 23    smoker   . Prostate cancer Neg Hx    History  Sexual Activity  . Sexual activity: Not Currently    Outpatient Encounter Prescriptions as of 04/06/2016  Medication Sig  . acetaminophen (TYLENOL) 500 MG tablet Take 500 mg by mouth as needed.    Marland Kitchen aspirin 325 MG tablet Take 325 mg by mouth daily.  . Aspirin-Acetaminophen-Caffeine (EXCEDRIN PO) Take 1 tablet by mouth daily as needed (Headaches).  . bisoprolol-hydrochlorothiazide (ZIAC) 5-6.25 MG tablet TAKE 2 TABLETS EVERY DAY  . Chlorphen-Pseudoephed-APAP (CORICIDIN D PO) Take 10 mg by mouth daily as needed.  . Cranberry 300 MG tablet Take 300 mg by mouth 3 (three) times daily.  Marland Kitchen doxazosin (CARDURA) 4 MG tablet TAKE 1 TABLET AT BEDTIME (Patient taking differently: TAKE 1 TABLET DAILY)  . Flaxseed Oil OIL 1,400 mg by Does not apply route daily.  . fluticasone (FLONASE) 50 MCG/ACT nasal spray Place 2  sprays into both nostrils daily.  . Ibuprofen 200 MG CAPS Take by mouth as needed.     No facility-administered encounter medications on file as of 04/06/2016.     Activities of Daily Living In your present state of health, do you have any difficulty performing the following activities: 04/06/2016 02/11/2016  Hearing? N N  Vision? N N  Difficulty concentrating or making decisions? N N  Walking or climbing stairs? N N  Dressing or bathing? N N  Doing errands, shopping? N N  Preparing Food and eating ? N -  Using the Toilet? N -  In the past six months, have you accidently leaked urine? N -  Do you have problems with loss of bowel control? N -  Managing your Medications? N -  Managing your Finances? N -  Housekeeping or managing your Housekeeping? N -  Some recent data might be hidden    Patient  Care Team: Wanda Plump, MD as PCP - General Jethro Bolus, MD as Consulting Physician (Urology)   Assessment:    Physical assessment deferred to PCP.   Exercise Activities and Dietary recommendations Current Exercise Habits: Structured exercise class, Type of exercise: strength training/weights;stretching;treadmill, Frequency (Times/Week): 4, Intensity: Moderate  Diet (meal preparation, eat out, water intake, caffeinated beverages, dairy products, fruits and vegetables): Wife does the cooking, pt is able to prepare food if needed. Eats lots of salads, chicken, fish. Very little red meat. Protein shakes a few times a week. Drinks plenty of water.  Breakfast: Banana, yogurt, cereal Lunch/Dinner: Salad w/ salmon or chicken or steak about 1/2 the time.      Goals    . Patient Stated (pt-stated)          Lose 15 lbs.      Fall Risk Fall Risk  04/06/2016 02/11/2016 02/16/2014  Falls in the past year? Yes No No  Number falls in past yr: 1 - -  Injury with Fall? No - -   Depression Screen PHQ 2/9 Scores 04/06/2016 02/11/2016 02/16/2014  PHQ - 2 Score 0 0 0    Cognitive Testing MMSE - Mini Mental State Exam 04/06/2016  Orientation to time 5  Orientation to Place 5  Registration 3  Attention/ Calculation 5  Recall 3  Language- name 2 objects 2  Language- repeat 1  Language- follow 3 step command 3  Language- read & follow direction 1  Write a sentence 1  Copy design 1  Total score 30    Immunization History  Administered Date(s) Administered  . Influenza Split 06/07/2014  . Influenza Whole 07/28/2007, 05/26/2010  . Influenza, High Dose Seasonal PF 07/02/2015  . Pneumococcal Conjugate-13 02/16/2014  . Pneumococcal Polysaccharide-23 05/18/2008  . Td 09/08/2003  . Tetanus 02/16/2014  . Zoster 05/26/2010   Screening Tests Health Maintenance  Topic Date Due  . INFLUENZA VACCINE  04/07/2016  . COLONOSCOPY  05/12/2017  . TETANUS/TDAP  02/17/2024  . ZOSTAVAX  Completed  .  PNA vac Low Risk Adult  Completed      Plan:   Continue to eat heart healthy diet (full of fruits, vegetables, whole grains, lean protein, water--limit salt, fat, and sugar intake) and increase physical activity as tolerated. Continue doing brain stimulating activities (puzzles, reading, adult coloring books, staying active) to keep memory sharp.  Consider keeping a food journal to identify and track your eating patterns. Plan ahead for your cravings while traveling and keep healthy snacks nearby in the car (apples, oranges, grapes, baby carrots,  and nuts make great car snacks!)   During the course of the visit the patient was educated and counseled about the following appropriate screening and preventive services:   Vaccines to include Pneumoccal, Influenza, Hepatitis B, Td, Zostavax, HCV  Cardiovascular Disease  Colorectal cancer screening  Diabetes screening  Prostate Cancer Screening  Glaucoma screening  Nutrition counseling    Patient Instructions (the written plan) was given to the patient.    Starla Link, RN  04/06/2016  Agree, Tenna Child MD

## 2016-04-06 NOTE — Patient Instructions (Signed)
Continue to eat heart healthy diet (full of fruits, vegetables, whole grains, lean protein, water--limit salt, fat, and sugar intake) and increase physical activity as tolerated. Continue doing brain stimulating activities (puzzles, reading, adult coloring books, staying active) to keep memory sharp.  Consider keeping a food journal to identify and track your eating patterns. Plan ahead for your craving while traveling and keep healthy snacks nearby in the car (apples, oranges, grapes, baby carrots, and nuts make great car snacks!)  Travel safe!!  Advance Directive Advance directives are the legal documents that allow you to make choices about your health care and medical treatment if you cannot speak for yourself. Advance directives are a way for you to communicate your wishes to family, friends, and health care providers. The specified people can then convey your decisions about end-of-life care to avoid confusion if you should become unable to communicate. Ideally, the process of discussing and writing advance directives should happen over time rather than making decisions all at once. Advance directives can be modified as your situation changes, and you can change your mind at any time, even after you have signed the advance directives. Each state has its own laws regarding advance directives. You may want to check with your health care provider, attorney, or state representative about the law in your state. Below are some examples of advance directives. LIVING WILL A living will is a set of instructions documenting your wishes about medical care when you cannot care for yourself. It is used if you become:  Terminally ill.  Incapacitated.  Unable to communicate.  Unable to make decisions. Items to consider in your living will include:  The use or non-use of life-sustaining equipment, such as dialysis machines and breathing machines (ventilators).  A do not resuscitate (DNR) order, which is  the instruction not to use cardiopulmonary resuscitation (CPR) if breathing or heartbeat stops.  Tube feeding.  Withholding of food and fluids.  Comfort (palliative) care when the goal becomes comfort rather than a cure.  Organ and tissue donation. A living will does not give instructions about distribution of your money and property if you should pass away. It is advisable to seek the expert advice of a lawyer in drawing up a will regarding your possessions. Decisions about taxes, beneficiaries, and asset distribution will be legally binding. This process can relieve your family and friends of any burdens surrounding disputes or questions that may come up about the allocation of your assets. DO NOT RESUSCITATE (DNR) A do not resuscitate (DNR) order is a request to not have CPR in the event that your heart stops beating or you stop breathing. Unless given other instructions, a health care provider will try to help any patient whose heart has stopped or who has stopped breathing.  HEALTH CARE PROXY AND DURABLE POWER OF ATTORNEY FOR HEALTH CARE A health care proxy is a person (agent) appointed to make medical decisions for you if you cannot. Generally, people choose someone they know well and trust to represent their preferences when they can no longer do so. You should be sure to ask this person for agreement to act as your agent. An agent may have to exercise judgment in the event of a medical decision for which your wishes are not known. The durable power of attorney for health care is the legal document that names your health care proxy. Once written, it should be:  Signed.  Notarized.  Dated.  Copied.  Witnessed.  Incorporated into your medical record.  You may also want to appoint someone to manage your financial affairs if you cannot. This is called a durable power of attorney for finances. It is a separate legal document from the durable power of attorney for health care. You may  choose the same person or someone different from your health care proxy to act as your agent in financial matters.   This information is not intended to replace advice given to you by your health care provider. Make sure you discuss any questions you have with your health care provider.   Document Released: 12/01/2007 Document Revised: 08/29/2013 Document Reviewed: 01/11/2013 Elsevier Interactive Patient Education [employer]

## 2016-04-08 ENCOUNTER — Other Ambulatory Visit: Payer: Self-pay | Admitting: Family Medicine

## 2016-04-08 NOTE — Telephone Encounter (Signed)
Rx request to pharmacy/[facility]  

## 2016-04-15 ENCOUNTER — Other Ambulatory Visit: Payer: Self-pay | Admitting: Internal Medicine

## 2016-05-27 ENCOUNTER — Ambulatory Visit (INDEPENDENT_AMBULATORY_CARE_PROVIDER_SITE_OTHER): Payer: Commercial Managed Care - HMO | Admitting: Behavioral Health

## 2016-05-27 DIAGNOSIS — Z23 Encounter for immunization: Secondary | ICD-10-CM | POA: Diagnosis not present

## 2016-05-27 NOTE — Progress Notes (Addendum)
Pre visit review using our clinic review tool, if applicable. No additional management support is needed unless otherwise documented below in the visit note.  Patient in clinic today for Influenza vaccination. IM given in Left Deltoid. Patient tolerated injection well. No signs or symptoms of a reaction prior to leaving the nurse visit.

## 2016-06-15 ENCOUNTER — Other Ambulatory Visit: Payer: Self-pay | Admitting: Internal Medicine

## 2016-06-15 ENCOUNTER — Other Ambulatory Visit: Payer: Self-pay | Admitting: Family Medicine

## 2016-08-17 ENCOUNTER — Other Ambulatory Visit: Payer: Self-pay | Admitting: Internal Medicine

## 2017-02-15 ENCOUNTER — Ambulatory Visit (INDEPENDENT_AMBULATORY_CARE_PROVIDER_SITE_OTHER): Payer: Medicare Other | Admitting: Internal Medicine

## 2017-02-15 ENCOUNTER — Encounter: Payer: Self-pay | Admitting: Internal Medicine

## 2017-02-15 VITALS — BP 118/78 | HR 58 | Temp 97.8°F | Resp 14 | Ht 67.0 in | Wt 205.1 lb

## 2017-02-15 DIAGNOSIS — Z0000 Encounter for general adult medical examination without abnormal findings: Secondary | ICD-10-CM | POA: Diagnosis not present

## 2017-02-15 DIAGNOSIS — R739 Hyperglycemia, unspecified: Secondary | ICD-10-CM

## 2017-02-15 DIAGNOSIS — G4733 Obstructive sleep apnea (adult) (pediatric): Secondary | ICD-10-CM

## 2017-02-15 DIAGNOSIS — Z1211 Encounter for screening for malignant neoplasm of colon: Secondary | ICD-10-CM

## 2017-02-15 LAB — CBC WITH DIFFERENTIAL/PLATELET
BASOS PCT: 0.4 % (ref 0.0–3.0)
Basophils Absolute: 0 10*3/uL (ref 0.0–0.1)
EOS ABS: 0.1 10*3/uL (ref 0.0–0.7)
Eosinophils Relative: 0.9 % (ref 0.0–5.0)
HEMATOCRIT: 42.3 % (ref 39.0–52.0)
Hemoglobin: 14.4 g/dL (ref 13.0–17.0)
LYMPHS PCT: 33.2 % (ref 12.0–46.0)
Lymphs Abs: 2.1 10*3/uL (ref 0.7–4.0)
MCHC: 34 g/dL (ref 30.0–36.0)
MCV: 87.7 fl (ref 78.0–100.0)
MONO ABS: 0.4 10*3/uL (ref 0.1–1.0)
Monocytes Relative: 6.2 % (ref 3.0–12.0)
Neutro Abs: 3.7 10*3/uL (ref 1.4–7.7)
Neutrophils Relative %: 59.3 % (ref 43.0–77.0)
PLATELETS: 223 10*3/uL (ref 150.0–400.0)
RBC: 4.83 Mil/uL (ref 4.22–5.81)
RDW: 13.8 % (ref 11.5–15.5)
WBC: 6.2 10*3/uL (ref 4.0–10.5)

## 2017-02-15 LAB — TSH: TSH: 2.32 u[IU]/mL (ref 0.35–4.50)

## 2017-02-15 LAB — LIPID PANEL
CHOLESTEROL: 154 mg/dL (ref 0–200)
HDL: 28.5 mg/dL — ABNORMAL LOW (ref >39.00)
NonHDL: 125.68
Total CHOL/HDL Ratio: 5
Triglycerides: 214 mg/dL — ABNORMAL HIGH (ref 0.0–149.0)
VLDL: 42.8 mg/dL — AB (ref 0.0–40.0)

## 2017-02-15 LAB — COMPREHENSIVE METABOLIC PANEL
ALBUMIN: 4.1 g/dL (ref 3.5–5.2)
ALK PHOS: 45 U/L (ref 39–117)
ALT: 19 U/L (ref 0–53)
AST: 17 U/L (ref 0–37)
BILIRUBIN TOTAL: 0.6 mg/dL (ref 0.2–1.2)
BUN: 25 mg/dL — AB (ref 6–23)
CO2: 26 mEq/L (ref 19–32)
Calcium: 9.2 mg/dL (ref 8.4–10.5)
Chloride: 105 mEq/L (ref 96–112)
Creatinine, Ser: 1.19 mg/dL (ref 0.40–1.50)
GFR: 63.05 mL/min (ref >60.00)
Glucose, Bld: 126 mg/dL — ABNORMAL HIGH (ref 70–99)
POTASSIUM: 4.3 meq/L (ref 3.5–5.1)
SODIUM: 137 meq/L (ref 135–145)
TOTAL PROTEIN: 7.1 g/dL (ref 6.0–8.3)

## 2017-02-15 LAB — HEMOGLOBIN A1C: HEMOGLOBIN A1C: 6.2 % (ref 4.6–6.5)

## 2017-02-15 LAB — LDL CHOLESTEROL, DIRECT: Direct LDL: 96 mg/dL

## 2017-02-15 NOTE — Assessment & Plan Note (Addendum)
--  Td 2015; pneumonia shot 2009; prevnar 02-2014; zostavax:2011; shingrex discussed (on back order) --CCS:  Cscope @ [facility] 11-08-2001 Dr Loman Chromanhoton: small internal Hemorrhoids. Cscope 05-12-2012, 1 polyp, tubular adenoma, Dr Loman Chromanhoton-- refer to GI --DRE PSA per urology --On ASA 325 mg daily, recommend 81 mg daily  --Labs: CMP, FLP, CBC, A1c, TSH Diet-exercise discussed

## 2017-02-15 NOTE — Progress Notes (Signed)
Pre visit review using our clinic review tool, if applicable. No additional management support is needed unless otherwise documented below in the visit note. 

## 2017-02-15 NOTE — Patient Instructions (Signed)
GO TO THE LAB : Get the blood work     GO TO THE FRONT DESK Schedule your next appointment for a  Check up in 6-7 months

## 2017-02-15 NOTE — Progress Notes (Signed)
Subjective:    Patient ID: Timothy Harry., male    DOB: 12-Sep-1940, 76 y.o.   MRN: [id number]  DOS:  02/15/2017 Type of visit - description : CPX Interval history:  In general feeling well   Review of Systems Having some ear congestion, worse on the right for few months. A lot of wax on and off. Has allergies, mostly nasal congestion and discharge. For the last 6 months has occasional dizziness sometimes associated with nausea in the context of getting up quickly. "I have to steady myself and I'm okay" after less than a minute. No associated facial numbness, diplopia, motor deficits.   Other than above, a 14 point review of systems is negative      Past Medical History:  Diagnosis Date  . Actinic keratosis    hyperplastic aktinic keratosis forehead s/p surgery Dr Park Liter  . BPH (benign prostatic hyperplasia)    s/p TUNA  . Chronic cystitis    Dr. Patsi Sears  . DVT (deep venous thrombosis) (HCC) 2005   no know triggers per pt  . HTN (hypertension)    dx in the 70s  . OSA (obstructive sleep apnea)    on CPAP  . Recurrent UTI   . Umbilical hernia    no surgery  . Vitamin D deficiency    Dr. Patsi Sears    Past Surgical History:  Procedure Laterality Date  . PROSTATE SURGERY  2003   TUNA around 2003    Social History   Social History  . Marital status: Married    Spouse name: N/A  . Number of children: 4  . Years of education: N/A   Occupational History  . 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 Smoking status: Never Smoker  . Smokeless tobacco: Never Used  . Alcohol use 0.0 oz/week     Comment: 2x per week  . Drug use: No  . Sexual activity: Not Currently   Other Topics Concern  . Not on file   Social History Narrative   Lives w/ wife              Family History  Problem Relation Age of Onset  . Diabetes Mother        Judie Petit, sister  . Heart attack Father 30  .  Breast cancer Sister   . Rectal cancer Sister 5       @ time of her first Cscope  . Lung cancer Brother 52       smoker   . Prostate cancer Neg Hx   . Colon cancer Neg Hx      Allergies as of 02/15/2017      Reactions   Hydrocodone Shortness Of Breath   Intolerant: felt bad, couldn't breath. No actual rash or itching   Latex Hives      Medication List       Accurate as of 02/15/17  9:40 PM. Always use your most recent med list.          acetaminophen 500 MG tablet Commonly known as:  TYLENOL Take 500 mg by mouth as needed.   aspirin 81 MG tablet Take 81 mg by mouth daily.   aspirin 81 MG tablet Take 81 mg by mouth daily.   bisoprolol-hydrochlorothiazide 5-6.25 MG tablet Commonly known as:  ZIAC Take 2 tablets by mouth daily.   CORICIDIN D PO Take 10 mg by mouth daily as needed.  Cranberry 300 MG tablet Take 300 mg by mouth 3 (three) times daily.   doxazosin 4 MG tablet Commonly known as:  CARDURA Take 1 tablet (4 mg total) by mouth at bedtime.   EXCEDRIN PO Take 1 tablet by mouth daily as needed (Headaches).   Flaxseed Oil Oil 1,400 mg by Does not apply route daily.   fluticasone 50 MCG/ACT nasal spray Commonly known as:  FLONASE Place 2 sprays into both nostrils daily as needed for allergies or rhinitis.   Ibuprofen 200 MG Caps Take by mouth as needed.          Objective:   Physical Exam BP 118/78 (BP Location: Left Arm, Patient Position: Sitting, Cuff Size: Normal)   Pulse (!) 58   Temp 97.8 F (36.6 C) (Oral)   Resp 14   Ht 5\' 7"  (1.702 m)   Wt 205 lb 2 oz (93 kg)   SpO2 98%   BMI 32.13 kg/m   General:   Well developed, well nourished . NAD.  Neck: No  thyromegaly . Normal carotid pulses HEENT:  Normocephalic . Face symmetric, atraumatic. Right ear with hard cerumen impaction. Left ear mild amount of cerumen Lungs:  CTA B Normal respiratory effort, no intercostal retractions, no accessory muscle use. Heart: RRR,  no murmur.    No pretibial edema bilaterally  Abdomen:  Not distended, soft, non-tender. No rebound or rigidity.   Skin: Exposed areas without rash. Not pale. Not jaundice Neurologic:  alert & oriented X3.  Speech normal, gait appropriate for age and unassisted Strength symmetric and appropriate for age.  Psych: Cognition and judgment appear intact.  Cooperative with normal attention span and concentration.  Behavior appropriate. No anxious or depressed appearing.    Assessment & Plan:   Assessment: Prediabetes HTN BPH, recurrent UTIs, chronic cystitis OSA, on CPAP Umbilical hernia Actinic keratosis Tinnitus  H/o vitamin D deficiency H/o DVT, 2005, unprovoked   PLAN: Prediabetes: He remains active, goes to the gym 3 or 4 times a week. Check A1c HTN: On Cardura and Ziac. Ambulatory BP readings usually 120, occasionally 140. Sleep apnea: Has not use his CPAP in a couple months recommend to go back on it, offered a referral to Dr. 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 Rec to call if sxs get worse or he has worsening unilateral HOH Dizzines: as described above, rx observation, call if sx increase or become different Cerumen impaction: H2 O2 to the R ear for a couple weeks, schedule nurse visit for a lavage RTC 6-8 months

## 2017-02-15 NOTE — Assessment & Plan Note (Signed)
Prediabetes: He remains active, goes to the gym 3 or 4 times a week. Check A1c HTN: On Cardura and Ziac. Ambulatory BP readings usually 120, occasionally 140. Sleep apnea: Has not use his CPAP in a couple months recommend to go back on it, offered a referral to Dr. 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 Rec to call if sxs get worse or he has worsening unilateral HOH Dizzines: as described above, rx observation, call if sx increase or become different Cerumen impaction: H2 O2 to the R ear for a couple weeks, schedule nurse visit for a lavage RTC 6-8 months

## 2017-02-16 MED ORDER — FLUTICASONE PROPIONATE 50 MCG/ACT NA SUSP: 2.0000 | Freq: Every day | NASAL | 3 refills | Status: DC | PRN

## 2017-02-16 MED ORDER — BISOPROLOL-HYDROCHLOROTHIAZIDE 5-6.25 MG PO TABS: 2.0000 | ORAL_TABLET | Freq: Every day | ORAL | 2 refills | Status: DC

## 2017-02-16 MED ORDER — DOXAZOSIN MESYLATE 4 MG PO TABS: 4.0000 mg | ORAL_TABLET | Freq: Every day | ORAL | 2 refills | Status: DC

## 2017-02-16 NOTE — Addendum Note (Signed)
Addended byConrad Lyman: Annelies Coyt D on: 02/16/2017 04:15 PM   Modules accepted: Orders

## 2017-03-03 ENCOUNTER — Ambulatory Visit: Payer: Medicare Other

## 2017-03-03 DIAGNOSIS — H6123 Impacted cerumen, bilateral: Secondary | ICD-10-CM

## 2017-03-03 NOTE — Progress Notes (Signed)
Pre visit review using our clinic tool,if applicable. No additional management support is needed unless otherwise documented below in the visit note.   Patient in for ear lavage per order from Dr. Tenna ChildJ. Paz.   Patient states he has had more problems with Right ear than L eft. Both ears examined wax buildup seen more in Right ear. More loose wax noted in Left. Both ears flushed with warm water and Peroxide solution. Good results noted from Right ear.   Per evaluation from Dr. Shanda BumpsJessica Copland DOD, ears look better at this time. Patient states he has lesion on left side of nose. Per Dr. Patsy Lageropland she will refer to Dermatology.

## 2017-03-30 DIAGNOSIS — Z1211 Encounter for screening for malignant neoplasm of colon: Secondary | ICD-10-CM | POA: Diagnosis not present

## 2017-03-30 DIAGNOSIS — Z800 Family history of malignant neoplasm of digestive organs: Secondary | ICD-10-CM | POA: Diagnosis not present

## 2017-03-30 DIAGNOSIS — K648 Other hemorrhoids: Secondary | ICD-10-CM | POA: Diagnosis not present

## 2017-03-30 DIAGNOSIS — Z86010 Personal history of colonic polyps: Secondary | ICD-10-CM | POA: Diagnosis not present

## 2017-03-30 DIAGNOSIS — K5730 Diverticulosis of large intestine without perforation or abscess without bleeding: Secondary | ICD-10-CM | POA: Diagnosis not present

## 2017-04-14 ENCOUNTER — Telehealth: Payer: Self-pay | Admitting: Internal Medicine

## 2017-04-14 ENCOUNTER — Encounter: Payer: Self-pay | Admitting: Family Medicine

## 2017-04-14 DIAGNOSIS — L989 Disorder of the skin and subcutaneous tissue, unspecified: Secondary | ICD-10-CM

## 2017-04-14 NOTE — Telephone Encounter (Signed)
I do not see referral- will place now

## 2017-04-14 NOTE — Telephone Encounter (Signed)
Relation to NF:AOZHpt:self Call back number:[phone]   Reason for call:  Patient was seen for ear irrigation on 03/03/17 and noted in chart Per evaluation from Dr. Shanda BumpsJessica Copland DOD, ears look better at this time. Patient states he has lesion on left side of nose. Per Dr. Patsy Lageropland she will refer to Dermatology, patient checking on the status of referral, please advise

## 2017-04-14 NOTE — Progress Notes (Addendum)
Subjective:   Timothy Stone. is a 76 y.o. male who presents for Medicare Annual/Subsequent preventive examination.  Review of Systems:  No ROS.  Medicare Wellness Visit. Additional risk factors are reflected in the social history.  Cardiac Risk Factors include: advanced age (>92men, >13 women);hypertension;male gender Sleep patterns: Wears CPAP. Sleeps about 7 hrs. Feels rested. Home Safety/Smoke Alarms: Feels safe in home. Smoke alarms in place.  Living environment; residence and Firearm Safety: Lives with wife. 1 story home. No guns.  Seat Belt Safety/Bike Helmet: Wears seat belt.   Counseling:   Eye Exam- Wearing glasses.  Dental- Dr.Novak. Every 6 months.  Male:   CCS-  Pt reports Colonoscopy 03/30/17 with Dr.Rotan-normal.  PSA- Appt in Sept with Urology.  Lab Results  Component Value Date   PSA 2.36 05/29/2015       Objective:    Vitals: BP 138/80 (BP Location: Left Arm, Patient Position: Sitting, Cuff Size: Normal)   Pulse (!) 52   Ht 5\' 7"  (1.702 m)   Wt 208 lb (94.3 kg)   SpO2 98%   BMI 32.58 kg/m   Body mass index is 32.58 kg/m.   Wt Readings from Last 3 Encounters:  04/26/17 208 lb (94.3 kg)  02/15/17 205 lb 2 oz (93 kg)  04/06/16 207 lb 9.6 oz (94.2 kg)   Temp Readings from Last 3 Encounters:  02/15/17 97.8 F (36.6 C) (Oral)  02/11/16 97.6 F (36.4 C) (Oral)  02/06/15 98 F (36.7 C) (Oral)   BP Readings from Last 3 Encounters:  04/26/17 138/80  02/15/17 118/78  04/06/16 120/72   Pulse Readings from Last 3 Encounters:  04/26/17 (!) 52  02/15/17 (!) 58  04/06/16 (!) 58   Tobacco History  Smoking Status  . Never Smoker  Smokeless Tobacco  . Never Used     Counseling given: Not Answered   Past Medical History:  Diagnosis Date  . Actinic keratosis    hyperplastic aktinic keratosis forehead s/p surgery Dr Park Liter  . BPH (benign prostatic hyperplasia)    s/p TUNA  . Chronic cystitis    Dr. Patsi Sears  . DVT (deep venous  thrombosis) (HCC) 2005   no know triggers per pt  . HTN (hypertension)    dx in the 70s  . OSA (obstructive sleep apnea)    on CPAP  . Recurrent UTI   . Umbilical hernia    no surgery  . Vitamin D deficiency    Dr. Patsi Sears   Past Surgical History:  Procedure Laterality Date  . PROSTATE SURGERY  2003   TUNA around 2003   Family History  Problem Relation Age of Onset  . Diabetes Mother        Judie Petit, sister  . Heart attack Father 20  . Breast cancer Sister   . Rectal cancer Sister 48       @ time of her first Cscope  . Lung cancer Brother 16       smoker   . Prostate cancer Neg Hx   . Colon cancer Neg Hx    History  Sexual Activity  . Sexual activity: Not Currently    Outpatient Encounter Prescriptions as of 04/26/2017  Medication Sig  . acetaminophen (TYLENOL) 500 MG tablet Take 500 mg by mouth as needed.    Marland Kitchen aspirin 81 MG tablet Take 81 mg by mouth daily.  . Aspirin-Acetaminophen-Caffeine (EXCEDRIN PO) Take 1 tablet by mouth daily as needed (Headaches).  . bisoprolol-hydrochlorothiazide (ZIAC) 5-6.25 MG  tablet Take 2 tablets by mouth daily.  . Chlorphen-Pseudoephed-APAP (CORICIDIN D PO) Take 10 mg by mouth daily as needed.  . Cranberry 300 MG tablet Take 300 mg by mouth 3 (three) times daily.  Marland Kitchen. doxazosin (CARDURA) 4 MG tablet Take 1 tablet (4 mg total) by mouth at bedtime.  . Flaxseed Oil OIL 1,400 mg by Does not apply route daily.  . fluticasone (FLONASE) 50 MCG/ACT nasal spray Place 2 sprays into both nostrils daily as needed for allergies or rhinitis.  . Ibuprofen 200 MG CAPS Take by mouth as needed.     No facility-administered encounter medications on file as of 04/26/2017.     Activities of Daily Living In your present state of health, do you have any difficulty performing the following activities: 04/26/2017  Hearing? N  Vision? N  Difficulty concentrating or making decisions? N  Walking or climbing stairs? N  Dressing or bathing? N  Doing errands,  shopping? N  Preparing Food and eating ? N  Using the Toilet? N  In the past six months, have you accidently leaked urine? N  Do you have problems with loss of bowel control? N  Managing your Medications? N  Managing your Finances? N  Housekeeping or managing your Housekeeping? N  Some recent data might be hidden    Patient Care Team: Wanda PlumpPaz, Jose E, MD as PCP - General Jethro Bolusannenbaum, Sigmund, MD as Consulting Physician (Urology)   Assessment:    Physical assessment deferred to PCP.  Exercise Activities and Dietary recommendations Current Exercise Habits: Structured exercise class, Type of exercise: treadmill;strength training/weights, Time (Minutes): 45, Frequency (Times/Week): 3, Weekly Exercise (Minutes/Week): 135, Intensity: Mild   Diet (meal preparation, eat out, water intake, caffeinated beverages, dairy products, fruits and vegetables): in general, a "healthy" diet        Goals      Patient Stated   . Patient Stated (pt-stated)          Lose 15 lbs.      Fall Risk Fall Risk  04/26/2017 04/06/2016 02/11/2016 02/16/2014  Falls in the past year? No Yes No No  Comment - Pt fell replacing a light bulb near the ground in his yard. His sprinklers turned on and started him and he lost his balance. - -  Number falls in past yr: - 1 - -  Injury with Fall? - No - -   Depression Screen PHQ 2/9 Scores 04/26/2017 04/06/2016 02/11/2016 02/16/2014  PHQ - 2 Score 0 0 0 0    Cognitive Function MMSE - Mini Mental State Exam 04/06/2016  Orientation to time 5  Orientation to Place 5  Registration 3  Attention/ Calculation 5  Recall 3  Language- name 2 objects 2  Language- repeat 1  Language- follow 3 step command 3  Language- read & follow direction 1  Write a sentence 1  Copy design 1  Total score 30        Immunization History  Administered Date(s) Administered  . Influenza Split 06/07/2014  . Influenza Whole 07/28/2007, 05/26/2010  . Influenza, High Dose Seasonal PF 07/02/2015,  05/27/2016  . Pneumococcal Conjugate-13 02/16/2014  . Pneumococcal Polysaccharide-23 05/18/2008  . Td 09/08/2003  . Tetanus 02/16/2014  . Zoster 05/26/2010   Screening Tests Health Maintenance  Topic Date Due  . INFLUENZA VACCINE  04/07/2017  . COLONOSCOPY  05/12/2017  . TETANUS/TDAP  02/17/2024  . PNA vac Low Risk Adult  Completed      Plan:  Follow up with  Dr.Paz as scheduled 08/17/17.  Continue to eat heart healthy diet (full of fruits, vegetables, whole grains, lean protein, water--limit salt, fat, and sugar intake) and increase physical activity as tolerated.  Continue doing brain stimulating activities (puzzles, reading, adult coloring books, staying active) to keep memory sharp.   Schedule eye exam.   I have personally reviewed and noted the following in the patient's chart:   . Medical and social history . Use of alcohol, tobacco or illicit drugs  . Current medications and supplements . Functional ability and status . Nutritional status . Physical activity . Advanced directives . List of other physicians . Hospitalizations, surgeries, and ER visits in previous 12 months . Vitals . Screenings to include cognitive, depression, and falls . Referrals and appointments  In addition, I have reviewed and discussed with patient certain preventive protocols, quality metrics, and best practice recommendations. A written personalized care plan for preventive services as well as general preventive health recommendations were provided to patient.     Mady Haagensen [place], [place]  04/26/2017  Willow Ora, MD

## 2017-04-15 ENCOUNTER — Ambulatory Visit: Payer: Self-pay | Admitting: *Deleted

## 2017-04-26 ENCOUNTER — Encounter: Payer: Self-pay | Admitting: *Deleted

## 2017-04-26 ENCOUNTER — Ambulatory Visit (INDEPENDENT_AMBULATORY_CARE_PROVIDER_SITE_OTHER): Payer: Medicare Other | Admitting: *Deleted

## 2017-04-26 VITALS — BP 138/80 | HR 52 | Ht 67.0 in | Wt 208.0 lb

## 2017-04-26 DIAGNOSIS — Z0000 Encounter for general adult medical examination without abnormal findings: Secondary | ICD-10-CM | POA: Diagnosis not present

## 2017-04-26 NOTE — Patient Instructions (Addendum)
Timothy Stone , Thank you for taking time to come for your Medicare Wellness Visit. I appreciate your ongoing commitment to your health goals. Please review the following plan we discussed and let me know if I can assist you in the future.   These are the goals we discussed: Goals      Patient Stated   . Patient Stated (pt-stated)          Lose 15 lbs.       This is a list of the screening recommended for you and due dates:  Health Maintenance  Topic Date Due  . Flu Shot  04/07/2017  . Colon Cancer Screening  05/12/2017  . Tetanus Vaccine  02/17/2024  . Pneumonia vaccines  Completed   Continue to eat heart healthy diet (full of fruits, vegetables, whole grains, lean protein, water--limit salt, fat, and sugar intake) and increase physical activity as tolerated.  Continue doing brain stimulating activities (puzzles, reading, adult coloring books, staying active) to keep memory sharp.   Schedule eye exam.   Health Maintenance, Male A healthy lifestyle and preventive care is important for your health and wellness. Ask your health care provider about what schedule of regular examinations is right for you. What should I know about weight and diet? Eat a Healthy Diet  Eat plenty of vegetables, fruits, whole grains, low-fat dairy products, and lean protein.  Do not eat a lot of foods high in solid fats, added sugars, or salt.  Maintain a Healthy Weight Regular exercise can help you achieve or maintain a healthy weight. You should:  Do at least 150 minutes of exercise each week. The exercise should increase your heart rate and make you sweat (moderate-intensity exercise).  Do strength-training exercises at least twice a week.  Watch Your Levels of Cholesterol and Blood Lipids  Have your blood tested for lipids and cholesterol every 5 years starting at 77 years of age. If you are at high risk for heart disease, you should start having your blood tested when you are 77 years old. You  may need to have your cholesterol levels checked more often if: ? Your lipid or cholesterol levels are high. ? You are older than 77 years of age. ? You are at high risk for heart disease.  What should I know about cancer screening? Many types of cancers can be detected early and may often be prevented. Lung Cancer  You should be screened every year for lung cancer if: ? You are a current smoker who has smoked for at least 30 years. ? You are a former smoker who has quit within the past 15 years.  Talk to your health care provider about your screening options, when you should start screening, and how often you should be screened.  Colorectal Cancer  Routine colorectal cancer screening usually begins at 77 years of age and should be repeated every 5-10 years until you are 77 years old. You may need to be screened more often if early forms of precancerous polyps or small growths are found. Your health care provider may recommend screening at an earlier age if you have risk factors for colon cancer.  Your health care provider may recommend using home test kits to check for hidden blood in the stool.  A small camera at the end of a tube can be used to examine your colon (sigmoidoscopy or colonoscopy). This checks for the earliest forms of colorectal cancer.  Prostate and Testicular Cancer  Depending on your age  and overall health, your health care provider may do certain tests to screen for prostate and testicular cancer.  Talk to your health care provider about any symptoms or concerns you have about testicular or prostate cancer.  Skin Cancer  Check your skin from head to toe regularly.  Tell your health care provider about any new moles or changes in moles, especially if: ? There is a change in a mole's size, shape, or color. ? You have a mole that is larger than a pencil eraser.  Always use sunscreen. Apply sunscreen liberally and repeat throughout the day.  Protect yourself by  wearing long sleeves, pants, a wide-brimmed hat, and sunglasses when outside.  What should I know about heart disease, diabetes, and high blood pressure?  If you are 61-41 years of age, have your blood pressure checked every 3-5 years. If you are 24 years of age or older, have your blood pressure checked every year. You should have your blood pressure measured twice-once when you are at a hospital or clinic, and once when you are not at a hospital or clinic. Record the average of the two measurements. To check your blood pressure when you are not at a hospital or clinic, you can use: ? An automated blood pressure machine at a pharmacy. ? A home blood pressure monitor.  Talk to your health care provider about your target blood pressure.  If you are between 73-64 years old, ask your health care provider if you should take aspirin to prevent heart disease.  Have regular diabetes screenings by checking your fasting blood sugar level. ? If you are at a normal weight and have a low risk for diabetes, have this test once every three years after the age of 32. ? If you are overweight and have a high risk for diabetes, consider being tested at a younger age or more often.  A one-time screening for abdominal aortic aneurysm (AAA) by ultrasound is recommended for men aged 65-75 years who are current or former smokers. What should I know about preventing infection? Hepatitis B If you have a higher risk for hepatitis B, you should be screened for this virus. Talk with your health care provider to find out if you are at risk for hepatitis B infection. Hepatitis C Blood testing is recommended for:  Everyone born from [birth year] through 1966.  Anyone with known risk factors for hepatitis C.  Sexually Transmitted Diseases (STDs)  You should be screened each year for STDs including gonorrhea and chlamydia if: ? You are sexually active and are younger than 77 years of age. ? You are older than 77 years of age  and your health care provider tells you that you are at risk for this type of infection. ? Your sexual activity has changed since you were last screened and you are at an increased risk for chlamydia or gonorrhea. Ask your health care provider if you are at risk.  Talk with your health care provider about whether you are at high risk of being infected with HIV. Your health care provider may recommend a prescription medicine to help prevent HIV infection.  What else can I do?  Schedule regular health, dental, and eye exams.  Stay current with your vaccines (immunizations).  Do not use any tobacco products, such as cigarettes, chewing tobacco, and e-cigarettes. If you need help quitting, ask your health care provider.  Limit alcohol intake to no more than 2 drinks per day. One drink equals 12 ounces of  beer, 5 ounces of wine, or 1 ounces of hard liquor.  Do not use street drugs.  Do not share needles.  Ask your health care provider for help if you need support or information about quitting drugs.  Tell your health care provider if you often feel depressed.  Tell your health care provider if you have ever been abused or do not feel safe at home. This information is not intended to replace advice given to you by your health care provider. Make sure you discuss any questions you have with your health care provider. Document Released: 02/20/2008 Document Revised: 04/22/2016 Document Reviewed: 05/28/2015 Elsevier Interactive Patient Education  [employer].

## 2017-05-12 ENCOUNTER — Encounter: Payer: Self-pay | Admitting: Internal Medicine

## 2017-05-27 ENCOUNTER — Ambulatory Visit (INDEPENDENT_AMBULATORY_CARE_PROVIDER_SITE_OTHER): Payer: Medicare Other | Admitting: Pulmonary Disease

## 2017-05-27 ENCOUNTER — Encounter: Payer: Self-pay | Admitting: Pulmonary Disease

## 2017-05-27 VITALS — BP 114/72 | HR 44 | Ht 67.0 in | Wt 204.0 lb

## 2017-05-27 DIAGNOSIS — I10 Essential (primary) hypertension: Secondary | ICD-10-CM

## 2017-05-27 DIAGNOSIS — G4733 Obstructive sleep apnea (adult) (pediatric): Secondary | ICD-10-CM

## 2017-05-27 NOTE — Patient Instructions (Signed)
We will try to obtain-year-old sleep studies from [facility]  Prescription for auto CPAP 5-15 cm will be sent today We will obtain her report in 6 weeks and pointing to machine.  If required by her insurance, we will repeat your study as a home sleep study  Drop Ziac to once daily

## 2017-05-27 NOTE — Assessment & Plan Note (Signed)
We will try to obtain-year-old sleep studies from [facility]  Prescription for auto CPAP 5-15 cm will be sent today We will obtain her report in 6 weeks and pointing to machine.  If required by her insurance, we will repeat your study as a home sleep study  Weight loss encouraged, compliance with goal of at least 4-6 hrs every night is the expectation. Advised against medications with sedative side effects Cautioned against driving when sleepy - understanding that sleepiness will vary on a day to day basis

## 2017-05-27 NOTE — Progress Notes (Signed)
Subjective:    Patient ID: Timothy Stone., male    DOB: [birth year], 76 y.o.   MRN: [id number]  HPI  Chief Complaint  Patient presents with  . Follow-up    Pt has used CPAP machine over 77 years old. Pt is in need of new CPAP machine and supplies- D[facility].    76 year old man presents for management of OSA. OSA was diagnosed in the 90s, he was on CPAP 13 cm initially, he obtained new CPAP machine in 2008, placed on auto CPAP settings He was last seen in 2015 requesting a new machine but never received this even though prescription was sent to a preop area since then he is a retired from [employer] and switch to [employer].  He uses a nasal mask and denies any problems with mask or pressure. He reports feeling stopped up in h is nose on occasion. He feels rested on waking up, goes to gym daily with his wife, comes back and often takes a nap. Naps are refreshing. Bedtime is between 11 and 12 midnight, sleep latency is minimal, he reports one to 2 nocturnal awakenings without post void sleep latency and is out of bed by 7 AM feeling rested without dryness of mouth or headaches. He sleeps on his left side with one pillow.  Epworth sleepiness score is 8.  Medication review shows bisoprolol 10 mg daily. He reports dizziness and blood pressure in the 110s which is low for him. His heart rate is 44 today His weight has remained stable for the last 5 years   Past Medical History:  Diagnosis Date  . Actinic keratosis    hyperplastic aktinic keratosis forehead s/p surgery Dr Park Liter  . BPH (benign prostatic hyperplasia)    s/p TUNA  . Chronic cystitis    Dr. Patsi Sears  . DVT (deep venous thrombosis) (HCC) 2005   no know triggers per pt  . HTN (hypertension)    dx in the 70s  . OSA (obstructive sleep apnea)    on CPAP  . Recurrent UTI   . Umbilical hernia    no surgery  . Vitamin D deficiency    Dr. Patsi Sears     Past Surgical History:  Procedure Laterality  Date  . PROSTATE SURGERY  2003   TUNA around 2003     Allergies  Allergen Reactions  . Hydrocodone Shortness Of Breath    Intolerant: felt bad, couldn't breath. No actual rash or itching  . Latex Hives     Social History   Social History  . Marital status: Married    Spouse name: N/A  . Number of children: 4  . Years of education: N/A   Occupational History  . 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 Smoking status: Never Smoker  . Smokeless tobacco: Never Used  . Alcohol use 0.0 oz/week     Comment: 2x per week  . Drug use: No  . Sexual activity: Not Currently   Other Topics Concern  . Not on file   Social History Narrative   Lives w/ wife               Family History  Problem Relation Age of Onset  . Diabetes Mother        Judie Petit, sister  . Heart attack Father 42  . Breast cancer Sister   . Rectal cancer Sister 13       @  time of her first Cscope  . Lung cancer Brother 20       smoker   . Prostate cancer Neg Hx   . Colon cancer Neg Hx      Review of Systems Constitutional: negative for anorexia, fevers and sweats  Eyes: negative for irritation, redness and visual disturbance  Ears, nose, mouth, throat, and face: negative for earaches, epistaxis, nasal congestion and sore throat  Respiratory: negative for cough, dyspnea on exertion, sputum and wheezing  Cardiovascular: negative for chest pain, dyspnea, lower extremity edema, orthopnea, palpitations and syncope  Gastrointestinal: negative for abdominal pain, constipation, diarrhea, melena, nausea and vomiting  Genitourinary:negative for dysuria, frequency and hematuria  Hematologic/lymphatic: negative for bleeding, easy bruising and lymphadenopathy  Musculoskeletal:negative for arthralgias, muscle weakness and stiff joints  Neurological: negative for coordination problems, gait problems, headaches and weakness  Endocrine:  negative for diabetic symptoms including polydipsia, polyuria and weight loss     Objective:   Physical Exam  Gen. Pleasant, well-nourished, in no distress ENT - no thrush, no post nasal drip Neck: No JVD, no thyromegaly, no carotid bruits Lungs: no use of accessory muscles, no dullness to percussion, clear without rales or rhonchi  Cardiovascular: Rhythm regular, heart sounds  normal, no murmurs or gallops, no peripheral edema Musculoskeletal: No deformities, no cyanosis or clubbing        Assessment & Plan:

## 2017-05-27 NOTE — Assessment & Plan Note (Signed)
Given bradycardia and his complaints of dizziness and relative hypotension Drop Ziac to once daily

## 2017-05-31 DIAGNOSIS — G4733 Obstructive sleep apnea (adult) (pediatric): Secondary | ICD-10-CM | POA: Diagnosis not present

## 2017-06-01 DIAGNOSIS — G4733 Obstructive sleep apnea (adult) (pediatric): Secondary | ICD-10-CM | POA: Diagnosis not present

## 2017-06-03 ENCOUNTER — Telehealth: Payer: Self-pay | Admitting: Pulmonary Disease

## 2017-06-03 ENCOUNTER — Other Ambulatory Visit: Payer: Self-pay | Admitting: *Deleted

## 2017-06-03 DIAGNOSIS — G4733 Obstructive sleep apnea (adult) (pediatric): Secondary | ICD-10-CM

## 2017-06-03 NOTE — Telephone Encounter (Signed)
Per RA, HST showed severe OSA with 52 events per hour. Need to confirm RX for auto CPAP 5-10cm received and processed.

## 2017-06-10 DIAGNOSIS — L821 Other seborrheic keratosis: Secondary | ICD-10-CM | POA: Diagnosis not present

## 2017-06-14 DIAGNOSIS — R350 Frequency of micturition: Secondary | ICD-10-CM | POA: Diagnosis not present

## 2017-06-14 DIAGNOSIS — N401 Enlarged prostate with lower urinary tract symptoms: Secondary | ICD-10-CM | POA: Diagnosis not present

## 2017-06-17 ENCOUNTER — Ambulatory Visit (INDEPENDENT_AMBULATORY_CARE_PROVIDER_SITE_OTHER): Payer: Medicare Other

## 2017-06-17 DIAGNOSIS — J111 Influenza due to unidentified influenza virus with other respiratory manifestations: Secondary | ICD-10-CM

## 2017-06-17 DIAGNOSIS — Z23 Encounter for immunization: Secondary | ICD-10-CM | POA: Diagnosis not present

## 2017-07-27 ENCOUNTER — Encounter: Payer: Self-pay | Admitting: Adult Health

## 2017-07-27 ENCOUNTER — Ambulatory Visit: Payer: Medicare Other | Admitting: Adult Health

## 2017-07-27 DIAGNOSIS — G4733 Obstructive sleep apnea (adult) (pediatric): Secondary | ICD-10-CM | POA: Diagnosis not present

## 2017-07-27 NOTE — Progress Notes (Signed)
 @Patient  ID: Timothy Stone., male    DOB: 06/27/1941, 76 y.o.   MRN: [id number]  Chief Complaint  Patient presents with  . Follow-up    OSA    Referring provider: Wanda PlumpPaz, Jose E, MD  HPI: 76 yo male followed for severe OSA (dx in 71990s).   TEST  HST showed severe OSA with 52 events per hour. Need to confirm RX for auto CPAP 5-10cm received and processed.    07/27/2017 Follow up  : OSA  Pt returns for 2 month follow up. He has been dx with OSA for many years. Wore CPAP for a long time but has not worn for last couple of years. He was recommended to restart last ov but has not started back on it yet.  He wants a new small CPAP machine but has not been able to get one.  Unable to determine why he is not wearing on consistent basis . We discussed repeating CPAP titration , trying new mask with download . He says he will just start wearing again and return for follow up .  I stressed CPAP compliance and pt education on OSA.        Allergies  Allergen Reactions  . Hydrocodone Shortness Of Breath    Intolerant: felt bad, couldn't breath. No actual rash or itching  . Latex Hives    Immunization History  Administered Date(s) Administered  . Influenza Split 06/07/2014  . Influenza Whole 07/28/2007, 05/26/2010  . Influenza, High Dose Seasonal PF 07/02/2015, 05/27/2016, 06/17/2017  . Pneumococcal Conjugate-13 02/16/2014  . Pneumococcal Polysaccharide-23 05/18/2008  . Td 09/08/2003  . Tetanus 02/16/2014  . Zoster 05/26/2010    Past Medical History:  Diagnosis Date  . Actinic keratosis    hyperplastic aktinic keratosis forehead s/p surgery Dr Park LiterAlbertini  . BPH (benign prostatic hyperplasia)    s/p TUNA  . Chronic cystitis    Dr. Patsi Searsannenbaum  . DVT (deep venous thrombosis) (HCC) 2005   no know triggers per pt  . HTN (hypertension)    dx in the 70s  . OSA (obstructive sleep apnea)    on CPAP  . Recurrent UTI   . Umbilical hernia    no surgery  . Vitamin D deficiency     Dr. Patsi Searsannenbaum    Tobacco History: Social History   Tobacco Use  Smoking Status Never Smoker  Smokeless Tobacco Never Used   Counseling given: Not Answered   Outpatient Encounter Medications as of 07/27/2017  Medication Sig  . acetaminophen (TYLENOL) 500 MG tablet Take 500 mg by mouth as needed.    Marland Kitchen. aspirin 81 MG tablet Take 81 mg by mouth daily.  . Aspirin-Acetaminophen-Caffeine (EXCEDRIN PO) Take 1 tablet by mouth daily as needed (Headaches).  . bisoprolol-hydrochlorothiazide (ZIAC) 5-6.25 MG tablet Take 2 tablets by mouth daily. (Patient taking differently: Take 1 tablet by mouth daily. )  . Chlorphen-Pseudoephed-APAP (CORICIDIN D PO) Take 10 mg by mouth daily as needed.  . doxazosin (CARDURA) 4 MG tablet Take 1 tablet (4 mg total) by mouth at bedtime.  . Flaxseed Oil OIL 1,400 mg by Does not apply route daily.  . fluticasone (FLONASE) 50 MCG/ACT nasal spray Place 2 sprays into both nostrils daily as needed for allergies or rhinitis.  . Ibuprofen 200 MG CAPS Take by mouth as needed.    Marland Kitchen. OVER THE COUNTER MEDICATION Prostate Plus Health Complex w/ Cran-Max-Cranberry 1 daily  . [DISCONTINUED] Cranberry 300 MG tablet Take 300 mg by mouth 3 (three)  times daily.   No facility-administered encounter medications on file as of 07/27/2017.      Review of Systems  Constitutional:   No  weight loss, night sweats,  Fevers, chills, fatigue, or  lassitude.  HEENT:   No headaches,  Difficulty swallowing,  Tooth/dental problems, or  Sore throat,                No sneezing, itching, ear ache, nasal congestion, post nasal drip,   CV:  No chest pain,  Orthopnea, PND, swelling in lower extremities, anasarca, dizziness, palpitations, syncope.   GI  No heartburn, indigestion, abdominal pain, nausea, vomiting, diarrhea, change in bowel habits, loss of appetite, bloody stools.   Resp: No shortness of breath with exertion or at rest.  No excess mucus, no productive cough,  No non-productive  cough,  No coughing up of blood.  No change in color of mucus.  No wheezing.  No chest wall deformity  Skin: no rash or lesions.  GU: no dysuria, change in color of urine, no urgency or frequency.  No flank pain, no hematuria   MS:  No joint pain or swelling.  No decreased range of motion.  No back pain.    Physical Exam  BP 116/70 (BP Location: Left Arm, Cuff Size: Normal)   Pulse 64   Ht 5\' 7"  (1.702 m)   Wt 206 lb 9.6 oz (93.7 kg)   SpO2 96%   BMI 32.36 kg/m   GEN: A/Ox3; pleasant , NAD, obese    HEENT:  [place]/AT,  EACs-clear, TMs-wnl, NOSE-clear, THROAT-clear, no lesions, no postnasal drip or exudate noted. Class 2 MP airway   NECK:  Supple w/ fair ROM; no JVD; normal carotid impulses w/o bruits; no thyromegaly or nodules palpated; no lymphadenopathy.    RESP  Clear  P & A; w/o, wheezes/ rales/ or rhonchi. no accessory muscle use, no dullness to percussion  CARD:  RRR, no m/r/g, no peripheral edema, pulses intact, no cyanosis or clubbing.  GI:   Soft & nt; nml bowel sounds; no organomegaly or masses detected.   Musco: Warm bil, no deformities or joint swelling noted.   Neuro: alert, no focal deficits noted.    Skin: Warm, no lesions or rashes    Lab Results:  CBC   BNP No results found for: BNP  ProBNP No results found for: PROBNP  Imaging: No results found.   Assessment & Plan:   Obstructive sleep apnea CPAP compliance education   Plan  Patient Instructions  Restart CPAP At bedtime  . Wear for at least 4 hr each night .  Work on healthy weight .  Do not drive if sleepy.  Follow up with Dr. Vassie LollAlva  In 6 weeks and As needed         Rubye Oaksammy Ilo Beamon, NP 07/27/2017

## 2017-07-27 NOTE — Patient Instructions (Addendum)
Restart CPAP At bedtime  . Wear for at least 4 hr each night .  Work on healthy weight .  Do not drive if sleepy.  Follow up with Dr. Vassie LollAlva  In 6 weeks and As needed

## 2017-07-27 NOTE — Assessment & Plan Note (Signed)
CPAP compliance education   Plan  Patient Instructions  Restart CPAP At bedtime  . Wear for at least 4 hr each night .  Work on healthy weight .  Do not drive if sleepy.  Follow up with Dr. Vassie LollAlva  In 6 weeks and As needed

## 2017-07-28 ENCOUNTER — Encounter: Payer: Self-pay | Admitting: Internal Medicine

## 2017-08-17 ENCOUNTER — Ambulatory Visit: Payer: Self-pay | Admitting: Internal Medicine

## 2017-08-19 ENCOUNTER — Encounter: Payer: Self-pay | Admitting: Internal Medicine

## 2017-08-19 ENCOUNTER — Ambulatory Visit: Payer: Medicare Other | Admitting: Internal Medicine

## 2017-08-19 VITALS — BP 118/78 | HR 66 | Temp 98.2°F | Resp 16 | Ht 67.0 in | Wt 209.0 lb

## 2017-08-19 DIAGNOSIS — G4733 Obstructive sleep apnea (adult) (pediatric): Secondary | ICD-10-CM | POA: Diagnosis not present

## 2017-08-19 DIAGNOSIS — H66012 Acute suppurative otitis media with spontaneous rupture of ear drum, left ear: Secondary | ICD-10-CM | POA: Diagnosis not present

## 2017-08-19 DIAGNOSIS — I10 Essential (primary) hypertension: Secondary | ICD-10-CM

## 2017-08-19 MED ORDER — CIPROFLOXACIN-DEXAMETHASONE 0.3-0.1 % OT SUSP: 4.0000 [drp] | Freq: Two times a day (BID) | OTIC | 0 refills | Status: DC

## 2017-08-19 NOTE — Assessment & Plan Note (Signed)
HTN: Continue Cardura, pulmonary  Decreased Ziac  to 1 tablet daily due to dizziness which  Improved but now has occ elevated BP.  For now continue with 1 dose of Ziac, take a second tab if needed, monitor BPs, watch salt intake. Middle Ear infection: Left middle ear infection by exam, Ciprodex for 1 week, recheck in 4 weeks.  If not gradually better call sooner. OSA: Still not using a CPAP, patient waiting to be contact by [facility] regards a slim card.  Message sent to pulmonary Skin lesion: Left face lesion already seen by dermatology and rx observation.  Recommend patient to watch the area, call dermatology if increasing size, bleeding or change in the color. RTC 1 month

## 2017-08-19 NOTE — Progress Notes (Signed)
Subjective:    Patient ID: Timothy HarryGabriel G Daudelin Jr., male    DOB: [birth year], 76 y.o.   MRN: [id number]  DOS:  08/19/2017 Type of visit - description : rov, multiple issues  Interval history: HTN: Was advised by pulmonary to decrease Ziac to 1 tablet daily d/t dizziness, it  has significantly decreased however from time to time gets high BP readings mostly in the 140s.  Only one time was more than 160.  Admits that elevated BP related to dietary indiscretion with salt Still not using a CPAP Has a skin lesion, left side of the face, saw dermatology, recommend observation.  Patient's wife is still concerned Still having an issue with sinus congestion every morning, abundant nasal discharge every morning. Had right ear draining which resolved 2 weeks ago, then is having left ear draining, he sees brown pus coming out of the left ear.  Overall it has decreased but the hearing is pretty muffle on the left.   Review of Systems   Past Medical History:  Diagnosis Date  . Actinic keratosis    hyperplastic aktinic keratosis forehead s/p surgery Dr Park LiterAlbertini  . BPH (benign prostatic hyperplasia)    s/p TUNA  . Chronic cystitis    Dr. Patsi Searsannenbaum  . DVT (deep venous thrombosis) (HCC) 2005   no know triggers per pt  . HTN (hypertension)    dx in the 70s  . OSA (obstructive sleep apnea)    on CPAP  . Recurrent UTI   . Umbilical hernia    no surgery  . Vitamin D deficiency    Dr. Patsi Searsannenbaum    Past Surgical History:  Procedure Laterality Date  . PROSTATE SURGERY  2003   TUNA around 2003    Social History   Socioeconomic History  . Marital status: Married    Spouse name: Not on file  . Number of children: 4  . Years of education: Not on file  . Highest education level: Not on file  Social Needs  . Financial resource strain: Not on file  . Food insecurity - worry: Not on file  . Food insecurity - inability: Not on file  . Transportation needs - medical: Not on file  .  Transportation needs - non-medical: Not on file  Occupational History  . 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 Smoking status: Never Smoker  . Smokeless tobacco: Never Used  Substance and Sexual Activity  . Alcohol use: Yes    Alcohol/week: 0.0 oz    Comment: 2x per week  . Drug use: No  . Sexual activity: Not Currently  Other Topics Concern  . Not on file  Social History Narrative   Lives w/ wife            Allergies as of 08/19/2017      Reactions   Hydrocodone Shortness Of Breath   Intolerant: felt bad, couldn't breath. No actual rash or itching   Latex Hives      Medication List        Accurate as of 08/19/17  1:32 PM. Always use your most recent med list.          acetaminophen 500 MG tablet Commonly known as:  TYLENOL Take 500 mg by mouth as needed.   aspirin 81 MG tablet Take 81 mg by mouth daily.   bisoprolol-hydrochlorothiazide 5-6.25 MG tablet Commonly known as:  ZIAC Take 2 tablets by mouth daily.   CORICIDIN  D PO Take 10 mg by mouth daily as needed.   doxazosin 4 MG tablet Commonly known as:  CARDURA Take 1 tablet (4 mg total) by mouth at bedtime.   EXCEDRIN PO Take 1 tablet by mouth daily as needed (Headaches).   Flaxseed Oil Oil 1,400 mg by Does not apply route daily.   fluticasone 50 MCG/ACT nasal spray Commonly known as:  FLONASE Place 2 sprays into both nostrils daily as needed for allergies or rhinitis.   Ibuprofen 200 MG Caps Take by mouth as needed.   OVER THE COUNTER MEDICATION Prostate Plus Health Complex w/ Cran-Max-Cranberry 1 daily          Objective:   Physical Exam BP 118/78 (BP Location: Left Arm, Patient Position: Sitting, Cuff Size: Large)   Pulse 66   Temp 98.2 F (36.8 C) (Oral)   Resp 16   Ht 5\' 7"  (1.702 m)   Wt 209 lb (94.8 kg)   SpO2 97%   BMI 32.73 kg/m  General:   Well developed, well nourished . NAD.  HEENT:    Normocephalic . Face symmetric, atraumatic Nose not congested, sinuses non-TTP. Right ear: Hard wax noted, unable to remove Left ear: Canal is normal but at the bottom he has green discharge. Lungs:  CTA B Normal respiratory effort, no intercostal retractions, no accessory muscle use. Heart: RRR,  no murmur.  No pretibial edema bilaterally  Skin: Has a scaly area left from the nose, around 3 x 2 mm inside Neurologic:  alert & oriented X3.  Speech normal, gait appropriate for age and unassisted Psych--  Cognition and judgment appear intact.  Cooperative with normal attention span and concentration.  Behavior appropriate. No anxious or depressed appearing.      Assessment & Plan:   Assessment: Prediabetes HTN BPH, recurrent UTIs, chronic cystitis OSA, on CPAP Umbilical hernia Actinic keratosis Tinnitus  H/o vitamin D deficiency H/o DVT, 2005, unprovoked   PLAN: HTN: Continue Cardura, pulmonary  Decreased Ziac  to 1 tablet daily due to dizziness which  Improved but now has occ elevated BP.  For now continue with 1 dose of Ziac, take a second tab if needed, monitor BPs, watch salt intake. Middle Ear infection: Left middle ear infection by exam, Ciprodex for 1 week, recheck in 4 weeks.  If not gradually better call sooner. OSA: Still not using a CPAP, patient waiting to be contact by [facility] regards a slim card.  Message sent to pulmonary Skin lesion: Left face lesion already seen by dermatology and rx observation.  Recommend patient to watch the area, call dermatology if increasing size, bleeding or change in the color. RTC 1 month

## 2017-08-19 NOTE — Patient Instructions (Signed)
   GO TO THE FRONT DESK Schedule your next appointment for a checkup in 4 weeks  Stay on Ziac 1 tablet daily, if BP is more than 140/90, take a second dose.  Use the eardrops for 1 week, come back in 1 month for recheck.  Call sooner if you are not improving

## 2017-09-13 ENCOUNTER — Ambulatory Visit: Payer: Self-pay | Admitting: Pulmonary Disease

## 2017-09-17 ENCOUNTER — Ambulatory Visit: Payer: Self-pay | Admitting: Internal Medicine

## 2017-09-22 ENCOUNTER — Ambulatory Visit: Payer: Self-pay | Admitting: Pulmonary Disease

## 2017-11-01 ENCOUNTER — Ambulatory Visit (INDEPENDENT_AMBULATORY_CARE_PROVIDER_SITE_OTHER): Payer: Medicare Other | Admitting: Internal Medicine

## 2017-11-01 ENCOUNTER — Encounter: Payer: Self-pay | Admitting: Internal Medicine

## 2017-11-01 VITALS — BP 124/68 | HR 60 | Temp 98.3°F | Resp 14 | Ht 67.0 in | Wt 199.5 lb

## 2017-11-01 DIAGNOSIS — J069 Acute upper respiratory infection, unspecified: Secondary | ICD-10-CM | POA: Diagnosis not present

## 2017-11-01 DIAGNOSIS — I10 Essential (primary) hypertension: Secondary | ICD-10-CM | POA: Diagnosis not present

## 2017-11-01 DIAGNOSIS — G4733 Obstructive sleep apnea (adult) (pediatric): Secondary | ICD-10-CM | POA: Diagnosis not present

## 2017-11-01 DIAGNOSIS — N390 Urinary tract infection, site not specified: Secondary | ICD-10-CM

## 2017-11-01 NOTE — Progress Notes (Signed)
Subjective:    Patient ID: Timothy HarryGabriel G Nocito Jr., male    DOB: [birth year], 77 y.o.   MRN: [id number]  DOS:  11/01/2017 Type of visit - description : acute Interval history: Respiratory symptoms started a week ago: Runny nose;cough, severe at times, dry. Initially had some body aches. Overall feels better. UTI?  On and off mild dysuria for 2-3 weeks, history of recurrent UTIs.  Patient concerned, check UA? HTN: Good med compliance, ambulatory BPs sometimes are in the low side, sometimes less than 100/50.  Review of Systems No fever chills No nausea or vomiting No gross hematuria, urinary difficulty or urinary frequency  Past Medical History:  Diagnosis Date  . Actinic keratosis    hyperplastic aktinic keratosis forehead s/p surgery Dr Park LiterAlbertini  . BPH (benign prostatic hyperplasia)    s/p TUNA  . Chronic cystitis    Dr. Patsi Searsannenbaum  . DVT (deep venous thrombosis) (HCC) 2005   no know triggers per pt  . HTN (hypertension)    dx in the 70s  . OSA (obstructive sleep apnea)    on CPAP  . Recurrent UTI   . Umbilical hernia    no surgery  . Vitamin D deficiency    Dr. Patsi Searsannenbaum    Past Surgical History:  Procedure Laterality Date  . PROSTATE SURGERY  2003   TUNA around 2003    Social History   Socioeconomic History  . Marital status: Married    Spouse name: Not on file  . Number of children: 4  . Years of education: Not on file  . Highest education level: Not on file  Social Needs  . Financial resource strain: Not on file  . Food insecurity - worry: Not on file  . Food insecurity - inability: Not on file  . Transportation needs - medical: Not on file  . Transportation needs - non-medical: Not on file  Occupational History  . 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 Smoking status: Never Smoker  . Smokeless tobacco: Never Used  Substance and Sexual Activity  . Alcohol use: Yes   Alcohol/week: 0.0 oz    Comment: 2x per week  . Drug use: No  . Sexual activity: Not Currently  Other Topics Concern  . Not on file  Social History Narrative   Lives w/ wife            Allergies as of 11/01/2017      Reactions   Hydrocodone Shortness Of Breath   Intolerant: felt bad, couldn't breath. No actual rash or itching   Latex Hives      Medication List        Accurate as of 11/01/17 11:59 PM. Always use your most recent med list.          acetaminophen 500 MG tablet Commonly known as:  TYLENOL Take 500 mg by mouth as needed.   aspirin 81 MG tablet Take 81 mg by mouth daily.   bisoprolol-hydrochlorothiazide 5-6.25 MG tablet Commonly known as:  ZIAC Take 1 tablet by mouth daily. Okay to take a second tablet if BP more than 140/85   CORICIDIN D PO Take 10 mg by mouth daily as needed.   doxazosin 4 MG tablet Commonly known as:  CARDURA Take 1 tablet (4 mg total) by mouth at bedtime.   EXCEDRIN PO Take 1 tablet by mouth daily as needed (Headaches).   Flaxseed Oil Oil 1,400 mg by Does not apply  route daily.   fluticasone 50 MCG/ACT nasal spray Commonly known as:  FLONASE Place 2 sprays into both nostrils daily as needed for allergies or rhinitis.   Ibuprofen 200 MG Caps Take by mouth as needed.   OVER THE COUNTER MEDICATION Prostate Plus Health Complex w/ Cran-Max-Cranberry 1 daily          Objective:   Physical Exam BP 124/68 (BP Location: Left Arm, Patient Position: Sitting, Cuff Size: Normal)   Pulse 60   Temp 98.3 F (36.8 C) (Oral)   Resp 14   Ht 5\' 7"  (1.702 m)   Wt 199 lb 8 oz (90.5 kg)   SpO2 95%   BMI 31.25 kg/m  General:   Well developed, well nourished . NAD.  HEENT:  Normocephalic . Face symmetric, atraumatic. TM: Both normal.  Nose is slightly congested, throat symmetric. Lungs:  CTA B Normal respiratory effort, no intercostal retractions, no accessory muscle use. Heart: RRR,  no murmur.  no pretibial edema  bilaterally  Abdomen:  Not distended, soft, non-tender. No rebound or rigidity.  No CVA tenderness Skin: Not pale. Not jaundice Neurologic:  alert & oriented X3.  Speech normal, gait appropriate for age and unassisted Psych--  Cognition and judgment appear intact.  Cooperative with normal attention span and concentration.  Behavior appropriate. No anxious or depressed appearing.     Assessment & Plan:   Assessment: Prediabetes HTN BPH, recurrent UTIs, chronic cystitis OSA, on CPAP Umbilical hernia Actinic keratosis Tinnitus  H/o vitamin D deficiency H/o DVT, 2005, unprovoked   PLAN: URI: Supportive treatment, see AVS  recurrent UTI: Check a UA , UCX;  he has mild symptoms, treat if appropriate. HTN: Cardura and Ziac, BP today is very good, he reports ambulatory BPs sometimes low, even less than 100.  Rec low-salt diet, continue Cardura, hold Ziac, take it for 3 or 4 days  when/if BP elevated. OSA: So far unable to get a slim card from [place], thus not using his CPAP.  Encouraged to call them again See last visit, midel ear infection resolved spontaneously without antibiotics. RTC CPX 02/2018

## 2017-11-01 NOTE — Patient Instructions (Addendum)
  GO TO THE FRONT DESK Schedule your next appointment for a physical exam in 4 months  Contact [facility] regards the slim card, is important you use your CPAP regularly  ====  Rest, fluids , tylenol  For cough:  Take Mucinex DM twice a day as needed until better  For nasal congestion: Use OTC  Flonase : 2 nasal sprays on each side of the nose in the morning until you feel better   Avoid decongestants such as  Pseudoephedrine or phenylephrine   Call if not gradually better over the next  10 days  Call anytime if the symptoms are severe    ==  HOLD  bisoprolol HCTZ.  Take it again for 2-3 days if your blood pressure is high.  Check the  blood pressure  daily Be sure your blood pressure is between 110/65 and  135/85. If it is consistently higher or lower, let me know

## 2017-11-01 NOTE — Progress Notes (Signed)
jPre visit review using our clinic review tool, if applicable. No additional management support is needed unless otherwise documented below in the visit note.

## 2017-11-02 LAB — URINALYSIS, ROUTINE W REFLEX MICROSCOPIC
BILIRUBIN URINE: NEGATIVE
HGB URINE DIPSTICK: NEGATIVE
KETONES UR: NEGATIVE
NITRITE: NEGATIVE
PH: 5.5 (ref 5.0–8.0)
RBC / HPF: NONE SEEN (ref 0–?)
Specific Gravity, Urine: 1.015 (ref 1.000–1.030)
Total Protein, Urine: NEGATIVE
UROBILINOGEN UA: 0.2 (ref 0.0–1.0)
Urine Glucose: NEGATIVE

## 2017-11-02 LAB — URINE CULTURE
MICRO NUMBER: 90243705
Result:: NO GROWTH
SPECIMEN QUALITY: ADEQUATE

## 2017-11-02 NOTE — Assessment & Plan Note (Signed)
URI: Supportive treatment, see AVS  recurrent UTI: Check a UA , UCX;  he has mild symptoms, treat if appropriate. HTN: Cardura and Ziac, BP today is very good, he reports ambulatory BPs sometimes low, even less than 100.  Rec low-salt diet, continue Cardura, hold Ziac, take it for 3 or 4 days  when/if BP elevated. OSA: So far unable to get a slim card from [facility], thus not using his CPAP.  Encouraged to call them again See last visit, midel ear infection resolved spontaneously without antibiotics. RTC CPX 02/2018

## 2017-11-04 ENCOUNTER — Encounter: Payer: Self-pay | Admitting: Internal Medicine

## 2017-11-04 DIAGNOSIS — G4733 Obstructive sleep apnea (adult) (pediatric): Secondary | ICD-10-CM

## 2017-11-05 ENCOUNTER — Other Ambulatory Visit: Payer: Self-pay

## 2017-11-05 NOTE — Telephone Encounter (Signed)
Spoke w/ Penni [facility]iersten at [facility]- she informed that all new CPAPs come w/ SIM cards. She couldn't guarantee that Pt's insurance would cover a new CPAP, but would send a new order form to us at [phone]- she also wanted to warn that they may need new titration study completed.

## 2017-11-08 ENCOUNTER — Telehealth: Payer: Self-pay

## 2017-11-08 NOTE — Telephone Encounter (Signed)
New CPAP orders have been faxed to [facility] at [phone].

## 2017-11-28 ENCOUNTER — Other Ambulatory Visit: Payer: Self-pay | Admitting: Internal Medicine

## 2017-12-20 DIAGNOSIS — R350 Frequency of micturition: Secondary | ICD-10-CM | POA: Diagnosis not present

## 2017-12-20 DIAGNOSIS — N401 Enlarged prostate with lower urinary tract symptoms: Secondary | ICD-10-CM | POA: Diagnosis not present

## 2018-01-07 ENCOUNTER — Ambulatory Visit (INDEPENDENT_AMBULATORY_CARE_PROVIDER_SITE_OTHER): Payer: Medicare Other

## 2018-01-07 DIAGNOSIS — I10 Essential (primary) hypertension: Secondary | ICD-10-CM

## 2018-01-07 NOTE — Progress Notes (Addendum)
Patient in today for blood pressure check.  He states he has not taking his medications yesterday nor today. He c/o lightheadedness.   He currently takes: bisoprolol-hydrochlorothiazide [facility]) 5-6.25 MG tablet  Blood pressure:120/76  Pulse:65  Patient has appointment with PCP on Monday he stated he just wanted to really see if his blood pressure machine was working, we tested his blood pressure machine against the manuel one here in the office   Willow Ora, MD

## 2018-01-10 ENCOUNTER — Encounter: Payer: Self-pay | Admitting: Internal Medicine

## 2018-01-10 ENCOUNTER — Ambulatory Visit: Payer: Medicare Other | Admitting: Internal Medicine

## 2018-01-10 VITALS — BP 118/70 | HR 62 | Temp 97.6°F | Resp 14 | Ht 67.0 in | Wt 204.5 lb

## 2018-01-10 DIAGNOSIS — I10 Essential (primary) hypertension: Secondary | ICD-10-CM | POA: Diagnosis not present

## 2018-01-10 DIAGNOSIS — N390 Urinary tract infection, site not specified: Secondary | ICD-10-CM | POA: Diagnosis not present

## 2018-01-10 DIAGNOSIS — H6063 Unspecified chronic otitis externa, bilateral: Secondary | ICD-10-CM

## 2018-01-10 NOTE — Progress Notes (Signed)
Pre visit review using our clinic review tool, if applicable. No additional management support is needed unless otherwise documented below in the visit note. 

## 2018-01-10 NOTE — Progress Notes (Signed)
Subjective:    Patient ID: Timothy Stone., male    DOB: 1941/08/01, 77 y.o.   MRN: [id number]  DOS:  01/10/2018 Type of visit - description :  f/u Interval history: Here to discuss his blood pressure. Last week, he came to be checked, he was feeling lightheaded, BP was 120/76.  His old manometer give him a different reading so he got a new one. Good compliance with medications Also, continue with on and off muffled feeling in both ears.  No actual pain or discharge.  Mild decreased hearing.  Review of Systems Has chest pain no difficulty breathing. Good compliance with CPAP  Past Medical History:  Diagnosis Date  . Actinic keratosis    hyperplastic aktinic keratosis forehead s/p surgery Dr Park Liter  . BPH (benign prostatic hyperplasia)    s/p TUNA  . Chronic cystitis    Dr. Liliane Shi  . DVT (deep venous thrombosis) (HCC) 2005   no know triggers per pt  . HTN (hypertension)    dx in the 70s  . OSA (obstructive sleep apnea)    on CPAP  . Recurrent UTI   . Umbilical hernia    no surgery  . Vitamin D deficiency     Past Surgical History:  Procedure Laterality Date  . PROSTATE SURGERY  2003   TUNA around 2003    Social History   Socioeconomic History  . Marital status: Married    Spouse name: Not on file  . Number of children: 4  . Years of education: Not on file  . Highest education level: Not on file  Occupational History  . 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 Financial resource strain: Not on file  . Food insecurity:    Worry: Not on file    Inability: Not on file  . Transportation needs:    Medical: Not on file    Non-medical: Not on file  Tobacco Use  . Smoking status: Never Smoker  . Smokeless tobacco: Never Used  Substance and Sexual Activity  . Alcohol use: Yes    Alcohol/week: 0.0 oz    Comment: 2x per week  . Drug use: No  . Sexual activity: Not Currently  Lifestyle   . Physical activity:    Days per week: Not on file    Minutes per session: Not on file  . Stress: Not on file  Relationships  . Social connections:    Talks on phone: Not on file    Gets together: Not on file    Attends religious service: Not on file    Active member of club or organization: Not on file    Attends meetings of clubs or organizations: Not on file    Relationship status: Not on file  . Intimate partner violence:    Fear of current or ex partner: Not on file    Emotionally abused: Not on file    Physically abused: Not on file    Forced sexual activity: Not on file  Other Topics Concern  . Not on file  Social History Narrative   Lives w/ wife            Allergies as of 01/10/2018      Reactions   Hydrocodone Shortness Of Breath   Intolerant: felt bad, couldn't breath. No actual rash or itching   Latex Hives      Medication List        Accurate  as of 01/10/18 11:59 PM. Always use your most recent med list.          acetaminophen 500 MG tablet Commonly known as:  TYLENOL Take 500 mg by mouth as needed.   aspirin 81 MG tablet Take 81 mg by mouth daily.   bisoprolol-hydrochlorothiazide 5-6.25 MG tablet Commonly known as:  ZIAC Take 1 tablet by mouth daily. Okay to take a second tablet if BP more than 140/85   doxazosin 4 MG tablet Commonly known as:  CARDURA Take 1 tablet (4 mg total) by mouth at bedtime.   EXCEDRIN PO Take 1 tablet by mouth daily as needed (Headaches).   Flaxseed Oil Oil 1,400 mg by Does not apply route daily.   fluticasone 50 MCG/ACT nasal spray Commonly known as:  FLONASE Place 2 sprays into both nostrils daily as needed for allergies or rhinitis.   Ibuprofen 200 MG Caps Take by mouth as needed.   OVER THE COUNTER MEDICATION Prostate Plus Health Complex w/ Cran-Max-Cranberry 1 daily          Objective:   Physical Exam BP 118/70 (BP Location: Left Arm, Patient Position: Sitting, Cuff Size: Small)   Pulse 62    Temp 97.6 F (36.4 C) (Oral)   Resp 14   Ht  (1.702 m)   Wt 204 lb 8 oz (92.8 kg)   SpO2 97%   BMI 32.03 kg/m  General:   Well developed, well nourished . NAD.  HEENT:  Normocephalic . Face symmetric, atraumatic. Continue with green debris  at the end of both ear canals, worse on the left.  Canal walls are otherwise normal without redness or swelling. Neurologic:  alert & oriented X3.  Speech normal, gait appropriate for age and unassisted Psych--  Cognition and judgment appear intact.  Cooperative with normal attention span and concentration.  Behavior appropriate. No anxious or depressed appearing.      Assessment & Plan:  Assessment: Prediabetes HTN BPH, recurrent UTIs, chronic cystitis OSA, on CPAP Umbilical hernia Actinic keratosis Tinnitus  H/o vitamin D deficiency H/o DVT, 2005, unprovoked   PLAN: HTN: At this point, he is taking Cardura, Ziac, BP today is very good, he feels well, his new manometer was concordant with our reading.  Recommend no change.  Continue monitoring BPs. Otitis externa: He continue with muffle hearing, on exam he still has debris on both ear canals, previously was prescribed eardrops but was unable to afford them.  Recommend ENT eval,  needs a more detail suspect he will need a very detailed exam of the TMs. OSA: Currently using his CPAP Recurrent UTIs: Saw urology last month. RTC CPX June 2019 already scheduled

## 2018-01-10 NOTE — Patient Instructions (Signed)
See you in June for a physical  Continue monitoring your blood pressures

## 2018-01-11 NOTE — Assessment & Plan Note (Signed)
HTN: At this point, he is taking Cardura, Ziac, BP today is very good, he feels well, his new manometer was concordant with our reading.  Recommend no change.  Continue monitoring BPs. Otitis externa: He continue with muffle hearing, on exam he still has debris on both ear canals, previously was prescribed eardrops but was unable to afford them.  Recommend ENT eval,  needs a more detail suspect he will need a very detailed exam of the TMs. OSA: Currently using his CPAP Recurrent UTIs: Saw urology last month. RTC CPX June 2019 already scheduled

## 2018-01-14 ENCOUNTER — Encounter: Payer: Self-pay | Admitting: Internal Medicine

## 2018-01-14 DIAGNOSIS — R400 Somnolence: Secondary | ICD-10-CM | POA: Diagnosis not present

## 2018-01-14 DIAGNOSIS — G4733 Obstructive sleep apnea (adult) (pediatric): Secondary | ICD-10-CM | POA: Diagnosis not present

## 2018-02-03 DIAGNOSIS — H6063 Unspecified chronic otitis externa, bilateral: Secondary | ICD-10-CM | POA: Diagnosis not present

## 2018-02-14 DIAGNOSIS — G4733 Obstructive sleep apnea (adult) (pediatric): Secondary | ICD-10-CM | POA: Diagnosis not present

## 2018-02-14 DIAGNOSIS — R400 Somnolence: Secondary | ICD-10-CM | POA: Diagnosis not present

## 2018-02-23 DIAGNOSIS — H524 Presbyopia: Secondary | ICD-10-CM | POA: Diagnosis not present

## 2018-02-23 DIAGNOSIS — H25813 Combined forms of age-related cataract, bilateral: Secondary | ICD-10-CM | POA: Diagnosis not present

## 2018-03-02 ENCOUNTER — Ambulatory Visit (INDEPENDENT_AMBULATORY_CARE_PROVIDER_SITE_OTHER): Payer: Medicare Other | Admitting: Internal Medicine

## 2018-03-02 ENCOUNTER — Encounter: Payer: Self-pay | Admitting: Internal Medicine

## 2018-03-02 VITALS — BP 122/60 | HR 63 | Temp 97.9°F | Resp 16 | Ht 67.0 in | Wt 204.2 lb

## 2018-03-02 DIAGNOSIS — I499 Cardiac arrhythmia, unspecified: Secondary | ICD-10-CM | POA: Diagnosis not present

## 2018-03-02 DIAGNOSIS — Z0000 Encounter for general adult medical examination without abnormal findings: Secondary | ICD-10-CM

## 2018-03-02 DIAGNOSIS — K429 Umbilical hernia without obstruction or gangrene: Secondary | ICD-10-CM | POA: Diagnosis not present

## 2018-03-02 DIAGNOSIS — H9193 Unspecified hearing loss, bilateral: Secondary | ICD-10-CM | POA: Diagnosis not present

## 2018-03-02 DIAGNOSIS — I498 Other specified cardiac arrhythmias: Secondary | ICD-10-CM

## 2018-03-02 DIAGNOSIS — Z23 Encounter for immunization: Secondary | ICD-10-CM | POA: Diagnosis not present

## 2018-03-02 DIAGNOSIS — R739 Hyperglycemia, unspecified: Secondary | ICD-10-CM | POA: Diagnosis not present

## 2018-03-02 DIAGNOSIS — H9313 Tinnitus, bilateral: Secondary | ICD-10-CM

## 2018-03-02 LAB — CBC WITH DIFFERENTIAL/PLATELET
Basophils Absolute: 0 10*3/uL (ref 0.0–0.1)
Basophils Relative: 0.3 % (ref 0.0–3.0)
EOS ABS: 0 10*3/uL (ref 0.0–0.7)
Eosinophils Relative: 0.6 % (ref 0.0–5.0)
HCT: 42.8 % (ref 39.0–52.0)
HEMOGLOBIN: 14.5 g/dL (ref 13.0–17.0)
Lymphocytes Relative: 30.3 % (ref 12.0–46.0)
Lymphs Abs: 2.1 10*3/uL (ref 0.7–4.0)
MCHC: 33.9 g/dL (ref 30.0–36.0)
MCV: 88.1 fl (ref 78.0–100.0)
MONO ABS: 0.4 10*3/uL (ref 0.1–1.0)
Monocytes Relative: 6.1 % (ref 3.0–12.0)
NEUTROS PCT: 62.7 % (ref 43.0–77.0)
Neutro Abs: 4.3 10*3/uL (ref 1.4–7.7)
Platelets: 210 10*3/uL (ref 150.0–400.0)
RBC: 4.85 Mil/uL (ref 4.22–5.81)
RDW: 14 % (ref 11.5–15.5)
WBC: 6.9 10*3/uL (ref 4.0–10.5)

## 2018-03-02 LAB — LIPID PANEL
CHOLESTEROL: 162 mg/dL (ref 0–200)
HDL: 33.7 mg/dL — AB (ref >39.00)
LDL Cholesterol: 89 mg/dL (ref 0–99)
NONHDL: 128.24
Total CHOL/HDL Ratio: 5
Triglycerides: 196 mg/dL — ABNORMAL HIGH (ref 0.0–149.0)
VLDL: 39.2 mg/dL (ref 0.0–40.0)

## 2018-03-02 LAB — COMPREHENSIVE METABOLIC PANEL
ALBUMIN: 4.4 g/dL (ref 3.5–5.2)
ALK PHOS: 55 U/L (ref 39–117)
ALT: 16 U/L (ref 0–53)
AST: 18 U/L (ref 0–37)
BUN: 23 mg/dL (ref 6–23)
CO2: 28 mEq/L (ref 19–32)
CREATININE: 1.24 mg/dL (ref 0.40–1.50)
Calcium: 9.7 mg/dL (ref 8.4–10.5)
Chloride: 104 mEq/L (ref 96–112)
GFR: 59.96 mL/min — ABNORMAL LOW (ref >60.00)
GLUCOSE: 124 mg/dL — AB (ref 70–99)
Potassium: 5.6 mEq/L — ABNORMAL HIGH (ref 3.5–5.1)
SODIUM: 139 meq/L (ref 135–145)
Total Bilirubin: 0.4 mg/dL (ref 0.2–1.2)
Total Protein: 7.2 g/dL (ref 6.0–8.3)

## 2018-03-02 LAB — MAGNESIUM: Magnesium: 2.4 mg/dL (ref 1.5–2.5)

## 2018-03-02 LAB — HEMOGLOBIN A1C: HEMOGLOBIN A1C: 6.3 % (ref 4.6–6.5)

## 2018-03-02 LAB — TSH: TSH: 2.09 u[IU]/mL (ref 0.35–4.50)

## 2018-03-02 NOTE — Assessment & Plan Note (Signed)
Prediabetes: Check A1c HTN: Seems controlled, continue Cardura and Ziac. OSA: Has a new CPAP, doing well Umbilical hernia: Having some discomfort, not completely reducible, refer to general surgery for possible surgery. History of actinic keratosis: Thinking about see the dermatologist for different skin lesions, I agree, call if a referral is needed Vitamin D deficiency: Last level satisfactory, on supplements. Tinnitus, question of hearing difficulties: Refer to audiology Bigeminy: New issue, found on routine EKG, asymptomatic, on beta-blockers, had a normal stress test 2014.  Plan: Will get echo and Holter RTC 6 months, sooner if needed.

## 2018-03-02 NOTE — Assessment & Plan Note (Addendum)
--  Td 2015; pneumonia shot 02/2018; prevnar 02-2014; zostavax:2011; shingrex discussed  --CCS:  Cscope @ [facility] 11-08-2001 Dr Loman Chromanhoton: small internal Hemorrhoids. Cscope 05-12-2012, 1 polyp, tubular adenoma, Cscope again 03/30/17, no report --DRE PSA: Sees urology --On ASA 81 mg daily  --Labs: CMP, FLP, CBC, A1c, mg, TSH --lifestyle: gym x 5/week, room for improvement on diet but in general doing very well --EKG bigeminy

## 2018-03-02 NOTE — Progress Notes (Signed)
Pre visit review using our clinic review tool, if applicable. No additional management support is needed unless otherwise documented below in the visit note. 

## 2018-03-02 NOTE — Patient Instructions (Addendum)
GO TO THE LAB : Get the blood work     GO TO THE FRONT DESK Schedule your next appointment for a  Check up in 6 months   Check the  blood pressure 2 or 3 times a month  Be sure your blood pressure is between 110/65 and  135/85. If it is consistently higher or lower, let me know  Referrals: To general surgery To audiology  Please see your dermatologist  Echocardiogram, Holter monitor

## 2018-03-02 NOTE — Progress Notes (Signed)
Subjective:    Patient ID: Timothy Harry., male    DOB: October 16, 1940, 77 y.o.   MRN: [id number]  DOS:  03/02/2018 Type of visit - description : cpx Interval history: Has few concerns   Review of Systems To see the dentist today, broke a  tooth last night. Saw ENT, no more ear problems although he has tinnitus and a question of decreased hearing.  Would like to see audiology. Environmental allergies: Controlled  with as needed meds Occasional headaches, and lifelong issue, overall better than few years ago Has a umbilical hernia, mild discomfort from time to time.  Other than above, a 14 point review of systems is negative      Past Medical History:  Diagnosis Date  . Actinic keratosis    hyperplastic aktinic keratosis forehead s/p surgery Dr Park Liter  . BPH (benign prostatic hyperplasia)    s/p TUNA  . Chronic cystitis    Dr. Liliane Shi  . DVT (deep venous thrombosis) (HCC) 2005   no know triggers per pt  . HTN (hypertension)    dx in the 70s  . OSA (obstructive sleep apnea)    on CPAP  . Recurrent UTI   . Umbilical hernia    no surgery  . Vitamin D deficiency     Past Surgical History:  Procedure Laterality Date  . PROSTATE SURGERY  2003   TUNA around 2003    Social History   Socioeconomic History  . Marital status: Married    Spouse name: Not on file  . Number of children: 4  . Years of education: Not on file  . Highest education level: Not on file  Occupational History  . 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 Financial resource strain: Not on file  . Food insecurity:    Worry: Not on file    Inability: Not on file  . Transportation needs:    Medical: Not on file    Non-medical: Not on file  Tobacco Use  . Smoking status: Never Smoker  . Smokeless tobacco: Never Used  Substance and Sexual Activity  . Alcohol use: Yes    Alcohol/week: 0.0 oz    Comment: 2x per week  .  Drug use: No  . Sexual activity: Not Currently  Lifestyle  . Physical activity:    Days per week: Not on file    Minutes per session: Not on file  . Stress: Not on file  Relationships  . Social connections:    Talks on phone: Not on file    Gets together: Not on file    Attends religious service: Not on file    Active member of club or organization: Not on file    Attends meetings of clubs or organizations: Not on file    Relationship status: Not on file  . Intimate partner violence:    Fear of current or ex partner: Not on file    Emotionally abused: Not on file    Physically abused: Not on file    Forced sexual activity: Not on file  Other Topics Concern  . Not on file  Social History Narrative   Lives w/ wife           Family History  Problem Relation Age of Onset  . Diabetes Mother        Judie Petit, sister  . Heart attack Father 49  . Breast cancer Sister   . Rectal cancer  Sister [id number]       @ time of her first Cscope  . Lung cancer Brother [phone]       smoker   . Prostate cancer Neg Hx   . Colon cancer Neg Hx      Allergies as of 03/02/2018      Reactions   Hydrocodone Shortness Of Breath   Intolerant: felt bad, couldn't breath. No actual rash or itching   Latex Hives      Medication List        Accurate as of 03/02/18  6:48 PM. Always use your most recent med list.          acetaminophen 500 MG tablet Commonly known as:  TYLENOL Take 500 mg by mouth as needed.   aspirin 81 MG tablet Take 81 mg by mouth daily.   bisoprolol-hydrochlorothiazide 5-6.25 MG tablet Commonly known as:  ZIAC Take 1 tablet by mouth daily. Okay to take a second tablet if BP more than 140/85   CORICIDIN HBP PO Take by mouth.   doxazosin 4 MG tablet Commonly known as:  CARDURA Take 1 tablet (4 mg total) by mouth at bedtime.   EXCEDRIN PO Take 1 tablet by mouth daily as needed (Headaches).   Flaxseed Oil Oil 1,400 mg by Does not apply route daily.   fluticasone 50 MCG/ACT  nasal spray Commonly known as:  FLONASE Place 2 sprays into both nostrils daily as needed for allergies or rhinitis.   Ibuprofen 200 MG Caps Take by mouth as needed.   OVER THE COUNTER MEDICATION Prostate Plus Health Complex w/ Cran-Max-Cranberry 1 daily   Vitamin D3 2000 units Tabs Take 2,000 Units by mouth daily.          Objective:   Physical Exam BP 122/60 (BP Location: Left Arm, Patient Position: Sitting, Cuff Size: Small)   Pulse 63   Temp 97.9 F (36.6 C) (Oral)   Resp 16   Ht 5\' 7"  (1.702 m)   Wt 204 lb 4 oz (92.6 kg)   SpO2 97%   BMI 31.99 kg/m  General: Well developed, NAD, see BMI.  Neck: No  thyromegaly  HEENT:  Normocephalic . Face symmetric, atraumatic Lungs:  CTA B Normal respiratory effort, no intercostal retractions, no accessory muscle use. Heart: RRR,  no murmur.  No pretibial edema bilaterally  Abdomen:  Not distended, soft, non-tender. No rebound or rigidity.   Has a umbilical hernia, partially reducible, slightly tender. Skin: Exposed areas without rash. Not pale. Not jaundice Neurologic:  alert & oriented X3.  Speech normal, gait appropriate for age and unassisted Strength symmetric and appropriate for age.  Psych: Cognition and judgment appear intact.  Cooperative with normal attention span and concentration.  Behavior appropriate. No anxious or depressed appearing.     Assessment & Plan:    Assessment: Prediabetes HTN BPH, recurrent UTIs, chronic cystitis OSA, on CPAP Umbilical hernia Actinic keratosis; no derm eval recently as off 02/2018 Tinnitus  H/o vitamin D deficiency H/o DVT, 2005, unprovoked   PLAN: Prediabetes: Check A1c HTN: Seems controlled, continue Cardura and Ziac. OSA: Has a new CPAP, doing well Umbilical hernia: Having some discomfort, not completely reducible, refer to general surgery for possible surgery. History of actinic keratosis: Thinking about see the dermatologist for different skin lesions, I  agree, call if a referral is needed Vitamin D deficiency: Last level satisfactory, on supplements. Tinnitus, question of hearing difficulties: Refer to audiology Bigeminy: New issue, found on routine EKG, asymptomatic, on beta-blockers,  had a normal stress test 2014.  Plan: Will get echo and Holter RTC 6 months, sooner if needed.   Today, I spent more than 20   min with the patient in addition to his physical exam, >50% of the time counseling regards his chronic medical problems, a new problem (bigeminy) as well as referring him to surgery.

## 2018-03-07 ENCOUNTER — Ambulatory Visit (HOSPITAL_COMMUNITY): Payer: Medicare Other | Attending: Cardiovascular Disease

## 2018-03-07 ENCOUNTER — Other Ambulatory Visit: Payer: Self-pay

## 2018-03-07 DIAGNOSIS — I081 Rheumatic disorders of both mitral and tricuspid valves: Secondary | ICD-10-CM | POA: Insufficient documentation

## 2018-03-07 DIAGNOSIS — I10 Essential (primary) hypertension: Secondary | ICD-10-CM | POA: Insufficient documentation

## 2018-03-07 DIAGNOSIS — I499 Cardiac arrhythmia, unspecified: Secondary | ICD-10-CM | POA: Diagnosis not present

## 2018-03-07 DIAGNOSIS — I498 Other specified cardiac arrhythmias: Secondary | ICD-10-CM

## 2018-03-07 DIAGNOSIS — G4733 Obstructive sleep apnea (adult) (pediatric): Secondary | ICD-10-CM | POA: Insufficient documentation

## 2018-03-16 DIAGNOSIS — R400 Somnolence: Secondary | ICD-10-CM | POA: Diagnosis not present

## 2018-03-16 DIAGNOSIS — G4733 Obstructive sleep apnea (adult) (pediatric): Secondary | ICD-10-CM | POA: Diagnosis not present

## 2018-03-17 ENCOUNTER — Ambulatory Visit (INDEPENDENT_AMBULATORY_CARE_PROVIDER_SITE_OTHER): Payer: Medicare Other

## 2018-03-17 ENCOUNTER — Other Ambulatory Visit: Payer: Self-pay | Admitting: Internal Medicine

## 2018-03-17 DIAGNOSIS — I493 Ventricular premature depolarization: Secondary | ICD-10-CM | POA: Diagnosis not present

## 2018-03-17 DIAGNOSIS — I499 Cardiac arrhythmia, unspecified: Principal | ICD-10-CM

## 2018-03-17 DIAGNOSIS — K429 Umbilical hernia without obstruction or gangrene: Secondary | ICD-10-CM | POA: Diagnosis not present

## 2018-03-17 DIAGNOSIS — I498 Other specified cardiac arrhythmias: Secondary | ICD-10-CM

## 2018-03-23 ENCOUNTER — Other Ambulatory Visit: Payer: Self-pay | Admitting: Internal Medicine

## 2018-04-04 ENCOUNTER — Telehealth: Payer: Self-pay | Admitting: Internal Medicine

## 2018-04-04 DIAGNOSIS — I493 Ventricular premature depolarization: Secondary | ICD-10-CM

## 2018-04-04 NOTE — Telephone Encounter (Signed)
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 Patient in agreement

## 2018-04-04 NOTE — Telephone Encounter (Signed)
Referral placed to cardiology 

## 2018-04-16 DIAGNOSIS — R400 Somnolence: Secondary | ICD-10-CM | POA: Diagnosis not present

## 2018-04-16 DIAGNOSIS — G4733 Obstructive sleep apnea (adult) (pediatric): Secondary | ICD-10-CM | POA: Diagnosis not present

## 2018-04-27 ENCOUNTER — Ambulatory Visit: Payer: Self-pay | Admitting: *Deleted

## 2018-04-27 NOTE — Progress Notes (Addendum)
Subjective:   Timothy Stone. is a 77 y.o. male who presents for Medicare Annual/Subsequent preventive examination.  Review of Systems: No ROS.  Medicare Wellness Visit. Additional risk factors are reflected in the social history. Cardiac Risk Factors include: advanced age (>39men, >62 women);hypertension Sleep patterns: Wears CPAP. Sleeps about 7 hrs. Naps as needed.  Home Safety/Smoke Alarms: Feels safe in home. Smoke alarms in place. Lives w/ wife in 1 story home with bonus.   Male:   CCS- last 2013     PSA-  Lab Results  Component Value Date   PSA 2.36 05/29/2015       Objective:    Vitals: BP 138/82 (BP Location: Left Arm, Patient Position: Sitting, Cuff Size: Normal)   Pulse 60   Ht 5\' 7"  (1.702 m)   Wt 205 lb (93 kg)   SpO2 97%   BMI 32.11 kg/m   Body mass index is 32.11 kg/m.  Advanced Directives 04/29/2018 04/26/2017 04/06/2016  Does Patient Have a Medical Advance Directive? No No No  Would patient like information on creating a medical advance directive? No - Patient declined Yes (MAU/Ambulatory/Procedural Areas - Information given) Yes - Educational materials given    Tobacco Social History   Tobacco Use  Smoking Status Never Smoker  Smokeless Tobacco Never Used     Counseling given: Not Answered   Clinical Intake: Pain : No/denies pain   Past Medical History:  Diagnosis Date  . Actinic keratosis    hyperplastic aktinic keratosis forehead s/p surgery Dr Park Liter  . BPH (benign prostatic hyperplasia)    s/p TUNA  . Chronic cystitis    Dr. Liliane Shi  . DVT (deep venous thrombosis) (HCC) 2005   no know triggers per pt  . HTN (hypertension)    dx in the 70s  . OSA (obstructive sleep apnea)    on CPAP  . Recurrent UTI   . Umbilical hernia    no surgery  . Vitamin D deficiency    Past Surgical History:  Procedure Laterality Date  . PROSTATE SURGERY  2003   TUNA around 2003   Family History  Problem Relation Age of Onset  . Diabetes  Mother        Judie Petit, sister  . Heart attack Father 31  . Breast cancer Sister   . Rectal cancer Sister 54       @ time of her first Cscope  . Lung cancer Brother 54       smoker   . Prostate cancer Neg Hx   . Colon cancer Neg Hx    Social History   Socioeconomic History  . Marital status: Married    Spouse name: Not on file  . Number of children: 4  . Years of education: Not on file  . Highest education level: Not on file  Occupational History  . 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 Financial resource strain: Not on file  . Food insecurity:    Worry: Not on file    Inability: Not on file  . Transportation needs:    Medical: Not on file    Non-medical: Not on file  Tobacco Use  . Smoking status: Never Smoker  . Smokeless tobacco: Never Used  Substance and Sexual Activity  . Alcohol use: Yes    Alcohol/week: 0.0 standard drinks    Comment: 2x per week  . Drug use: No  . Sexual activity: Not Currently  Lifestyle  . Physical activity:    Days per week: Not on file    Minutes per session: Not on file  . Stress: Not on file  Relationships  . Social connections:    Talks on phone: Not on file    Gets together: Not on file    Attends religious service: Not on file    Active member of club or organization: Not on file    Attends meetings of clubs or organizations: Not on file    Relationship status: Not on file  Other Topics Concern  . Not on file  Social History Narrative   Lives w/ wife          Outpatient Encounter Medications as of 04/29/2018  Medication Sig  . aspirin 81 MG tablet Take 81 mg by mouth daily.  . Aspirin-Acetaminophen-Caffeine (EXCEDRIN PO) Take 1 tablet by mouth daily as needed (Headaches).  . bisoprolol-hydrochlorothiazide (ZIAC) 5-6.25 MG tablet Take 1 tablet by mouth daily. Okay to take a second tablet if BP more than 140/85  . Cholecalciferol (VITAMIN D3) 2000 units  TABS Take 2,000 Units by mouth daily.   Marland Kitchen. doxazosin (CARDURA) 4 MG tablet Take 1 tablet (4 mg total) by mouth at bedtime.  . Flaxseed Oil OIL 1,400 mg daily.   . fluticasone (FLONASE) 50 MCG/ACT nasal spray Place 2 sprays into both nostrils daily as needed for allergies or rhinitis.  Marland Kitchen. OVER THE COUNTER MEDICATION Prostate Plus Health Complex w/ Cran-Max-Cranberry 1 daily  . acetaminophen (TYLENOL) 500 MG tablet Take 500 mg by mouth as needed.    . Ibuprofen 200 MG CAPS Take by mouth as needed.    . [DISCONTINUED] DM-APAP-CPM (CORICIDIN HBP PO) Take by mouth.   No facility-administered encounter medications on file as of 04/29/2018.     Activities of Daily Living In your present state of health, do you have any difficulty performing the following activities: 04/29/2018  Hearing? N  Vision? N  Difficulty concentrating or making decisions? N  Walking or climbing stairs? N  Dressing or bathing? N  Doing errands, shopping? N  Preparing Food and eating ? N  Using the Toilet? N  In the past six months, have you accidently leaked urine? N  Do you have problems with loss of bowel control? N  Managing your Medications? N  Managing your Finances? N  Housekeeping or managing your Housekeeping? N  Some recent data might be hidden    Patient Care Team: Wanda PlumpPaz, Jose E, MD as PCP - General Liliane ShiWinter, Dorian Furnacehristopher Aaron, MD as Consulting Physician (Urology) Axel Filleramirez, Armando, MD as Consulting Physician (General Surgery)   Assessment:   This is a routine wellness examination for Timothy Stone. Physical assessment deferred to PCP.  Exercise Activities and Dietary recommendations Current Exercise Habits: Structured exercise class, Type of exercise: treadmill;strength training/weights, Time (Minutes): 60, Frequency (Times/Week): 6, Weekly Exercise (Minutes/Week): 360, Intensity: Moderate   Diet (meal preparation, eat out, water intake, caffeinated beverages, dairy products, fruits and vegetables): 24 hour  recall Breakfast: 3 eggs, bacon, grits, toast, decag coffee Lunch: breakfast was more of a brunch. Peanuts. Dinner:  Donato HeinzSalad with fish Water- 64oz  Goals    . Patient Stated (pt-stated)     Lose 15 lbs.       Fall Risk Fall Risk  04/29/2018 04/26/2017 04/06/2016 02/11/2016 02/16/2014  Falls in the past year? No No Yes No No  Comment - - Pt fell replacing a light bulb near the ground in his yard. His sprinklers turned  on and started him and he lost his balance. - -  Number falls in past yr: - - 1 - -  Injury with Fall? - - No - -   Depression Screen PHQ 2/9 Scores 04/29/2018 04/26/2017 04/06/2016 02/11/2016  PHQ - 2 Score 0 0 0 0    Cognitive Function Ad8 score reviewed for issues:  Issues making decisions:no  Less interest in hobbies / activities:no  Repeats questions, stories (family complaining):no  Trouble using ordinary gadgets (microwave, computer, phone):no  Forgets the month or year: no  Mismanaging finances: no  Remembering appts:no  Daily problems with thinking and/or memory:no Ad8 score is=0  MMSE - Mini Mental State Exam 04/26/2017 04/06/2016  Orientation to time 5 5  Orientation to Place 5 5  Registration 3 3  Attention/ Calculation 5 5  Recall 3 3  Language- name 2 objects 2 2  Language- repeat 1 1  Language- follow 3 step command 3 3  Language- read & follow direction 1 1  Write a sentence 1 1  Copy design 1 1  Total score 30 30        Immunization History  Administered Date(s) Administered  . Influenza Split 06/07/2014  . Influenza Whole 07/28/2007, 05/26/2010  . Influenza, High Dose Seasonal PF 07/02/2015, 05/27/2016, 06/17/2017  . Pneumococcal Conjugate-13 02/16/2014  . Pneumococcal Polysaccharide-23 05/18/2008, 03/02/2018  . Td 09/08/2003  . Tetanus 02/16/2014  . Zoster 05/26/2010    Screening Tests Health Maintenance  Topic Date Due  . INFLUENZA VACCINE  05/30/2018 (Originally 04/07/2018)  . COLONOSCOPY  03/30/2022  . TETANUS/TDAP   02/17/2024  . PNA vac Low Risk Adult  Completed      Plan:    Please schedule your next medicare wellness visit with me in 1 yr.  Continue to eat heart healthy diet (full of fruits, vegetables, whole grains, lean protein, water--limit salt, fat, and sugar intake) and increase physical activity as tolerated.  Continue doing brain stimulating activities (puzzles, reading, adult coloring books, staying active) to keep memory sharp.    I have personally reviewed and noted the following in the patient's chart:   . Medical and social history . Use of alcohol, tobacco or illicit drugs  . Current medications and supplements . Functional ability and status . Nutritional status . Physical activity . Advanced directives . List of other physicians . Hospitalizations, surgeries, and ER visits in previous 12 months . Vitals . Screenings to include cognitive, depression, and falls . Referrals and appointments  In addition, I have reviewed and discussed with patient certain preventive protocols, quality metrics, and best practice recommendations. A written personalized care plan for preventive services as well as general preventive health recommendations were provided to patient.     Mady HaagensenBritt, Avant Printy ParcoalAngel, [place]RN  04/29/2018  Willow OraJose Paz, MD

## 2018-04-29 ENCOUNTER — Encounter: Payer: Self-pay | Admitting: *Deleted

## 2018-04-29 ENCOUNTER — Ambulatory Visit (INDEPENDENT_AMBULATORY_CARE_PROVIDER_SITE_OTHER): Payer: Medicare Other | Admitting: *Deleted

## 2018-04-29 VITALS — BP 138/82 | HR 60 | Ht 67.0 in | Wt 205.0 lb

## 2018-04-29 DIAGNOSIS — Z0000 Encounter for general adult medical examination without abnormal findings: Secondary | ICD-10-CM | POA: Diagnosis not present

## 2018-04-29 NOTE — Patient Instructions (Signed)
Please schedule your next medicare wellness visit with me in 1 yr.  Continue to eat heart healthy diet (full of fruits, vegetables, whole grains, lean protein, water--limit salt, fat, and sugar intake) and increase physical activity as tolerated.  Continue doing brain stimulating activities (puzzles, reading, adult coloring books, staying active) to keep memory sharp.   Timothy Stone , Thank you for taking time to come for your Medicare Wellness Visit. I appreciate your ongoing commitment to your health goals. Please review the following plan we discussed and let me know if I can assist you in the future.   These are the goals we discussed: Goals    . Patient Stated (pt-stated)     Lose 15 lbs.       This is a list of the screening recommended for you and due dates:  Health Maintenance  Topic Date Due  . Flu Shot  05/30/2018*  . Colon Cancer Screening  03/30/2022  . Tetanus Vaccine  02/17/2024  . Pneumonia vaccines  Completed  *Topic was postponed. The date shown is not the original due date.    Health Maintenance, Male A healthy lifestyle and preventive care is important for your health and wellness. Ask your health care provider about what schedule of regular examinations is right for you. What should I know about weight and diet? Eat a Healthy Diet  Eat plenty of vegetables, fruits, whole grains, low-fat dairy products, and lean protein.  Do not eat a lot of foods high in solid fats, added sugars, or salt.  Maintain a Healthy Weight Regular exercise can help you achieve or maintain a healthy weight. You should:  Do at least 150 minutes of exercise each week. The exercise should increase your heart rate and make you sweat (moderate-intensity exercise).  Do strength-training exercises at least twice a week.  Watch Your Levels of Cholesterol and Blood Lipids  Have your blood tested for lipids and cholesterol every 5 years starting at 78 years of age. If you are at high risk  for heart disease, you should start having your blood tested when you are 78 years old. You may need to have your cholesterol levels checked more often if: ? Your lipid or cholesterol levels are high. ? You are older than 78 years of age. ? You are at high risk for heart disease.  What should I know about cancer screening? Many types of cancers can be detected early and may often be prevented. Lung Cancer  You should be screened every year for lung cancer if: ? You are a current smoker who has smoked for at least 30 years. ? You are a former smoker who has quit within the past 15 years.  Talk to your health care provider about your screening options, when you should start screening, and how often you should be screened.  Colorectal Cancer  Routine colorectal cancer screening usually begins at 78 years of age and should be repeated every 5-10 years until you are 78 years old. You may need to be screened more often if early forms of precancerous polyps or small growths are found. Your health care provider may recommend screening at an earlier age if you have risk factors for colon cancer.  Your health care provider may recommend using home test kits to check for hidden blood in the stool.  A small camera at the end of a tube can be used to examine your colon (sigmoidoscopy or colonoscopy). This checks for the earliest forms of colorectal  cancer.  Prostate and Testicular Cancer  Depending on your age and overall health, your health care provider may do certain tests to screen for prostate and testicular cancer.  Talk to your health care provider about any symptoms or concerns you have about testicular or prostate cancer.  Skin Cancer  Check your skin from head to toe regularly.  Tell your health care provider about any new moles or changes in moles, especially if: ? There is a change in a mole's size, shape, or color. ? You have a mole that is larger than a pencil eraser.  Always  use sunscreen. Apply sunscreen liberally and repeat throughout the day.  Protect yourself by wearing long sleeves, pants, a wide-brimmed hat, and sunglasses when outside.  What should I know about heart disease, diabetes, and high blood pressure?  If you are 40-69 years of age, have your blood pressure checked every 3-5 years. If you are 60 years of age or older, have your blood pressure checked every year. You should have your blood pressure measured twice-once when you are at a hospital or clinic, and once when you are not at a hospital or clinic. Record the average of the two measurements. To check your blood pressure when you are not at a hospital or clinic, you can use: ? An automated blood pressure machine at a pharmacy. ? A home blood pressure monitor.  Talk to your health care provider about your target blood pressure.  If you are between 68-7 years old, ask your health care provider if you should take aspirin to prevent heart disease.  Have regular diabetes screenings by checking your fasting blood sugar level. ? If you are at a normal weight and have a low risk for diabetes, have this test once every three years after the age of 46. ? If you are overweight and have a high risk for diabetes, consider being tested at a younger age or more often.  A one-time screening for abdominal aortic aneurysm (AAA) by ultrasound is recommended for men aged 65-75 years who are current or former smokers. What should I know about preventing infection? Hepatitis B If you have a higher risk for hepatitis B, you should be screened for this virus. Talk with your health care provider to find out if you are at risk for hepatitis B infection. Hepatitis C Blood testing is recommended for:  Everyone born from [birth year] through 1966.  Anyone with known risk factors for hepatitis C.  Sexually Transmitted Diseases (STDs)  You should be screened each year for STDs including gonorrhea and chlamydia if: ? You  are sexually active and are younger than 78 years of age. ? You are older than 78 years of age and your health care provider tells you that you are at risk for this type of infection. ? Your sexual activity has changed since you were last screened and you are at an increased risk for chlamydia or gonorrhea. Ask your health care provider if you are at risk.  Talk with your health care provider about whether you are at high risk of being infected with HIV. Your health care provider may recommend a prescription medicine to help prevent HIV infection.  What else can I do?  Schedule regular health, dental, and eye exams.  Stay current with your vaccines (immunizations).  Do not use any tobacco products, such as cigarettes, chewing tobacco, and e-cigarettes. If you need help quitting, ask your health care provider.  Limit alcohol intake to no more  than 2 drinks per day. One drink equals 12 ounces of beer, 5 ounces of wine, or 1 ounces of hard liquor.  Do not use street drugs.  Do not share needles.  Ask your health care provider for help if you need support or information about quitting drugs.  Tell your health care provider if you often feel depressed.  Tell your health care provider if you have ever been abused or do not feel safe at home. This information is not intended to replace advice given to you by your health care provider. Make sure you discuss any questions you have with your health care provider. Document Released: 02/20/2008 Document Revised: 04/22/2016 Document Reviewed: 05/28/2015 Elsevier Interactive Patient Education  [employer].

## 2018-05-17 DIAGNOSIS — R400 Somnolence: Secondary | ICD-10-CM | POA: Diagnosis not present

## 2018-05-17 DIAGNOSIS — G4733 Obstructive sleep apnea (adult) (pediatric): Secondary | ICD-10-CM | POA: Diagnosis not present

## 2018-06-16 ENCOUNTER — Ambulatory Visit (INDEPENDENT_AMBULATORY_CARE_PROVIDER_SITE_OTHER): Payer: Medicare Other

## 2018-06-16 DIAGNOSIS — Z23 Encounter for immunization: Secondary | ICD-10-CM

## 2018-06-16 DIAGNOSIS — R400 Somnolence: Secondary | ICD-10-CM | POA: Diagnosis not present

## 2018-06-16 DIAGNOSIS — G4733 Obstructive sleep apnea (adult) (pediatric): Secondary | ICD-10-CM | POA: Diagnosis not present

## 2018-06-27 DIAGNOSIS — R3914 Feeling of incomplete bladder emptying: Secondary | ICD-10-CM | POA: Diagnosis not present

## 2018-06-27 DIAGNOSIS — N3020 Other chronic cystitis without hematuria: Secondary | ICD-10-CM | POA: Diagnosis not present

## 2018-06-29 DIAGNOSIS — I493 Ventricular premature depolarization: Secondary | ICD-10-CM | POA: Insufficient documentation

## 2018-06-29 NOTE — Progress Notes (Signed)
Cardiology Office Note:    Date:  06/30/2018   ID:  Timothy Stone., DOB [birth year], MRN [id number]  PCP:  Wanda Plump, MD  Cardiologist:  No primary care provider on file.   Referring MD: Wanda Plump, MD   Chief Complaint  Patient presents with  . Advice Only  . Irregular Heart Beat    PVC    History of Present Illness:    Timothy Stone. is a 77 y.o. 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 Gerald Dexter.  Patient is very pleasant.  He exercises on a regular basis at the gym.  He uses a stationary bicycle and the treadmill.  Usually gets between 45 minutes and 60 minutes of physical activity 3-4 times per week.  He does note that his heart rate never increased above 90 bpm.  He denies chest pain and dyspnea.  He does note that "I do not kill myself exercising".  He states that throughout his life he has had irregular heartbeat.  At times during college states the nurses wanted to send him to the emergency room because of an irregular heartbeat.  He has no recollection of a particular diagnosis.  He has a son that has a heart rhythm problem.  He has never heard the term atrial fibrillation or PVCs relative to his condition.  He will inquire from his son about his specific diagnosis.  No history of myocardial infarction.  He denies orthopnea and PND.  He is not having lower extremity swelling.  No exercise-induced exhaustion or fainting.  He does note that if he exercises very hard or takes extra blood pressure medication that he may develop low blood pressure.  He apparently takes a sliding scale of his blood pressure medicine if measurements are greater than 145/90 mmHg.   Past Medical History:  Diagnosis Date  . Actinic keratosis    hyperplastic aktinic keratosis forehead s/p surgery Dr Park Liter  . BPH (benign  prostatic hyperplasia)    s/p TUNA  . Chronic cystitis    Dr. Liliane Shi  . DVT (deep venous thrombosis) (HCC) 2005   no know triggers per pt  . HTN (hypertension)    dx in the 70s  . OSA (obstructive sleep apnea)    on CPAP  . Recurrent UTI   . Umbilical hernia    no surgery  . Vitamin D deficiency     Past Surgical History:  Procedure Laterality Date  . PROSTATE SURGERY  2003   TUNA around 2003    Current Medications: Current Meds  Medication Sig  . acetaminophen (TYLENOL) 500 MG tablet Take 500 mg by mouth as needed.    Marland Kitchen aspirin 81 MG tablet Take 81 mg by mouth daily.  . Aspirin-Acetaminophen-Caffeine (EXCEDRIN PO) Take 1 tablet by mouth daily as needed (Headaches).  . bisoprolol-hydrochlorothiazide (ZIAC) 5-6.25 MG tablet Take 1 tablet by mouth daily. Okay to take a second tablet if BP more than 140/85  . Cholecalciferol (VITAMIN D3) 2000 units TABS Take 2,000 Units by mouth daily.   Marland Kitchen doxazosin (CARDURA) 4 MG tablet Take 1 tablet (4 mg total) by mouth at bedtime.  . Flaxseed Oil OIL 1,400 mg daily.   . fluticasone (FLONASE) 50 MCG/ACT nasal spray Place 2 sprays into both nostrils daily as needed for allergies or rhinitis.  . Ibuprofen 200  MG CAPS Take by mouth as needed.    Marland Kitchen OVER THE COUNTER MEDICATION Prostate Plus Health Complex w/ Cran-Max-Cranberry 1 daily     Allergies:   Hydrocodone; Oxycodone; and Latex   Social History   Socioeconomic History  . Marital status: Married    Spouse name: Not on file  . Number of children: 4  . Years of education: Not on file  . Highest education level: Not on file  Occupational History  . 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 Financial resource strain: Not on file  . Food insecurity:    Worry: Not on file    Inability: Not on file  . Transportation needs:    Medical: Not on file    Non-medical: Not on file  Tobacco Use  . Smoking status: Never  Smoker  . Smokeless tobacco: Never Used  Substance and Sexual Activity  . Alcohol use: Yes    Alcohol/week: 0.0 standard drinks    Comment: 2x per week  . Drug use: No  . Sexual activity: Not Currently  Lifestyle  . Physical activity:    Days per week: Not on file    Minutes per session: Not on file  . Stress: Not on file  Relationships  . Social connections:    Talks on phone: Not on file    Gets together: Not on file    Attends religious service: Not on file    Active member of club or organization: Not on file    Attends meetings of clubs or organizations: Not on file    Relationship status: Not on file  Other Topics Concern  . Not on file  Social History Narrative   Lives w/ wife           Family History: The patient's family history includes Breast cancer in his sister; Diabetes in his mother; Heart attack (age of onset: 52) in his father; Lung cancer (age of onset: 82) in his brother; Rectal cancer (age of onset: 26) in his sister. There is no history of Prostate cancer or Colon cancer.  ROS:   Please see the history of present illness.    Occasional dizziness.  Difficulty with balance, and headaches.  No episodes of syncope.  Nasal congestion and sinus congestion.  Has sleep apnea.  Uses CPAP.  All other systems reviewed and are negative.  EKGs/Labs/Other Studies Reviewed:    The following studies were reviewed today:  2 D Doppler Echocardiogram 03/2018:  Study Conclusions  - Left ventricle: The cavity size was mildly dilated. Wall   thickness was normal. Systolic function was normal. The estimated   ejection fraction was in the range of 50% to 55%. Doppler   parameters are consistent with both elevated ventricular   end-diastolic filling pressure and elevated left atrial filling   pressure. - Mitral valve: There was mild regurgitation. - Left atrium: The atrium was moderately dilated. - Atrial septum: No defect or patent foramen ovale was  identified.  24-hour Holter monitor March 17, 2018:  Study Highlights      Normal sinus rhythm and sinus bradycardia. No HR > 90 bpm  Frequent, predominately isolated PVC's in bigeminy and trigeminy.  PVC burden 19%  No pauses > 3 seconds   HEART RATE EPISODES Minimum HR: 40 BPM at 10:27:32 AM Maximum HR: 90 BPM at 11:50:01 AM Average HR: 58 BPM     EKG:  EKG is  ordered today.  The ekg ordered today demonstrates normal sinus rhythm with PVCs briefly and a trigeminal pattern.  When compared to the prior tracing performed in June 2019 demonstrated ventricular bigeminy but otherwise no difference.  Recent Labs: 03/02/2018: ALT 16; BUN 23; Creatinine, Ser 1.24; Hemoglobin 14.5; Magnesium 2.4; Platelets 210.0; Potassium 5.6; Sodium 139; TSH 2.09  Recent Lipid Panel    Component Value Date/Time   CHOL 162 03/02/2018 0944   TRIG 196.0 (H) 03/02/2018 0944   HDL 33.70 (L) 03/02/2018 0944   CHOLHDL 5 03/02/2018 0944   VLDL 39.2 03/02/2018 0944   LDLCALC 89 03/02/2018 0944   LDLDIRECT 96.0 02/15/2017 0838    Physical Exam:    VS:  BP 116/78   Pulse (!) 56   Ht 5\' 7"  (1.702 m)   Wt 210 lb 6.4 oz (95.4 kg)   BMI 32.95 kg/m     Wt Readings from Last 3 Encounters:  06/30/18 210 lb 6.4 oz (95.4 kg)  04/29/18 205 lb (93 kg)  03/02/18 204 lb 4 oz (92.6 kg)     GEN: Moderate obesity.  Well nourished, well developed in no acute distress HEENT: Normal NECK: No JVD. LYMPHATICS: No lymphadenopathy CARDIAC: Irregular RR, no murmur, no gallop, no edema. VASCULAR: Irregular with 2+ symmetric radial and carotid pulses.  No bruits. RESPIRATORY:  Clear to auscultation without rales, wheezing or rhonchi  ABDOMEN: Soft, non-tender, non-distended, No pulsatile mass, MUSCULOSKELETAL: No deformity  SKIN: Warm and dry NEUROLOGIC:  Alert and oriented x 3 PSYCHIATRIC:  Normal affect   ASSESSMENT:    1. PVC (premature ventricular contraction)   2. Obstructive sleep apnea   3.  Essential hypertension    PLAN:    In order of problems listed above:  1. Today's EKG demonstrates transient ventricular trigeminy.  The 24-hour monitor which was interpreted by me demonstrated approximately 19% of all QRS activity was and PVCs.  No instances of ventricular runs or ventricular tachycardia was noted.  A 2D Doppler echocardiogram is demonstrated normal/low normal LV systolic function with EF 50 to 55%.  Although the report states that there is mild LV enlargement of the end-diastolic dimension was less than 5 cm.  Left atrial enlargement was noted.  The premature ectopic activity is totally asymptomatic.  Therefore, I think the patient is at no significant risk.  We will do an exercise treadmill test with the patient on his antihypertensive therapy to determine if there is any tendency towards stress-induced increase in ectopy.  Magnesium, potassium, and calcium are all normal. 2. He uses CPAP. 3. BP is generally under excellent control.  Target 130/80 mmHg.  Plan exercise treadmill test to assess for stress-induced increase in ventricular ectopy.  Dated to this point suggests that he has had ventricular ectopy for years.  LV function is normal.  Isolated PVCs are noted.  He is asymptomatic and likely needs no further adjustment or change in therapy.  The stress test will help exclude ischemia.  Greater than 50% of the time during this office visit was spent in education, counseling, and coordination of care related to underlying disease process and testing as outlined.    Medication Adjustments/Labs and Tests Ordered: Current medicines are reviewed at length with the patient today.  Concerns regarding medicines are outlined above.  Orders Placed This Encounter  Procedures  . Exercise Tolerance Test  . EKG 12-Lead   No orders of the defined types were placed in this encounter.   Patient Instructions  Medication Instructions:  Your physician  recommends that you continue on  your current medications as directed. Please refer to the Current Medication list given to you today.  If you need a refill on your cardiac medications before your next appointment, please call your pharmacy.   Lab work: None If you have labs (blood work) drawn today and your tests are completely normal, you will receive your results only by: Marland Kitchen MyChart Message (if you have MyChart) OR . A paper copy in the mail If you have any lab test that is abnormal or we need to change your treatment, we will call you to review the results.  Testing/Procedures: Your physician has requested that you have an exercise tolerance test. For further information please visit [email or web address] Please also follow instruction sheet, as given.    Follow-Up: Your physician recommends that you schedule a follow-up appointment as needed with Dr. Katrinka Blazing.    Any Other Special Instructions Will Be Listed Below (If Applicable).       Signed, Lesleigh Noe, MD  06/30/2018 2:07 PM    [facility]

## 2018-06-30 ENCOUNTER — Ambulatory Visit: Payer: Medicare Other | Admitting: Interventional Cardiology

## 2018-06-30 ENCOUNTER — Encounter: Payer: Self-pay | Admitting: Interventional Cardiology

## 2018-06-30 VITALS — BP 116/78 | HR 56 | Ht 67.0 in | Wt 210.4 lb

## 2018-06-30 DIAGNOSIS — I10 Essential (primary) hypertension: Secondary | ICD-10-CM | POA: Diagnosis not present

## 2018-06-30 DIAGNOSIS — G4733 Obstructive sleep apnea (adult) (pediatric): Secondary | ICD-10-CM

## 2018-06-30 DIAGNOSIS — I493 Ventricular premature depolarization: Secondary | ICD-10-CM

## 2018-06-30 NOTE — Patient Instructions (Signed)
Medication Instructions:  Your physician recommends that you continue on your current medications as directed. Please refer to the Current Medication list given to you today.  If you need a refill on your cardiac medications before your next appointment, please call your pharmacy.   Lab work: None If you have labs (blood work) drawn today and your tests are completely normal, you will receive your results only by: Marland Kitchen MyChart Message (if you have MyChart) OR . A paper copy in the mail If you have any lab test that is abnormal or we need to change your treatment, we will call you to review the results.  Testing/Procedures: Your physician has requested that you have an exercise tolerance test. For further information please visit [email or web address] Please also follow instruction sheet, as given.    Follow-Up: Your physician recommends that you schedule a follow-up appointment as needed with Dr. Katrinka Blazing.    Any Other Special Instructions Will Be Listed Below (If Applicable).

## 2018-07-17 DIAGNOSIS — G4733 Obstructive sleep apnea (adult) (pediatric): Secondary | ICD-10-CM | POA: Diagnosis not present

## 2018-07-17 DIAGNOSIS — R400 Somnolence: Secondary | ICD-10-CM | POA: Diagnosis not present

## 2018-08-16 DIAGNOSIS — G4733 Obstructive sleep apnea (adult) (pediatric): Secondary | ICD-10-CM | POA: Diagnosis not present

## 2018-08-16 DIAGNOSIS — R400 Somnolence: Secondary | ICD-10-CM | POA: Diagnosis not present

## 2018-08-26 ENCOUNTER — Ambulatory Visit: Payer: Self-pay

## 2018-08-26 NOTE — Telephone Encounter (Signed)
Noted  

## 2018-08-26 NOTE — Telephone Encounter (Signed)
Patient called in with c/o "cough, congestion." He says "this has been going on for 4 days, cough, sinus congestion/pain, body aches. I have been taking Mucinex-DM, 4 doses of it, and the cough is about 20-30% better. I do cough up something, but it's red, the color of the Chloraseptic spray I use for the sore throat I have. I guess I need to pay attention to it more. I don't cough up all the time, just sometimes." I asked about other symptoms, he denies wheezing, chest pain, fever. According to protocol, see PCP within 24 hours. He says "I am out of town in [place] and I [facility] for an urgent care in the area. The only one they gave me to call that takes my insurance has changed their name and doesn't accept my insurance. So, I'm just stuck right now." I advised an E-Visit through MyChart or I can send this to the provider for his recommendation. He says "send to Dr. Drue NovelPaz and if he recommends to do the E-Visit, then I will do that." Care advice given, advised someone from the office will call back with Dr. Drue NovelPaz recommendation, patient verbalized understanding.  Reason for Disposition . [1] Using nasal washes and pain medicine > 24 hours AND [2] sinus pain (around cheekbone or eye) persists  Answer Assessment - Initial Assessment Questions 1. ONSET: "When did the cough begin?"      About 4 days ago 2. SEVERITY: "How bad is the cough today?"      Probably 20-30% better than the worst. Mucinex-DM has helped 3. RESPIRATORY DISTRESS: "Describe your breathing."      Breathing is fine 4. FEVER: "Do you have a fever?" If so, ask: "What is your temperature, how was it measured, and when did it start?"     No 5. SPUTUM: "Describe the color of your sputum" (clear, white, yellow, green)     Unable to tell due to spraying red chloraseptic for sore throat 6. HEMOPTYSIS: "Are you coughing up any blood?" If so ask: "How much?" (flecks, streaks, tablespoons, etc.)     No 7. CARDIAC HISTORY: "Do  you have any history of heart disease?" (e.g., heart attack, congestive heart failure)      No 8. LUNG HISTORY: "Do you have any history of lung disease?"  (e.g., pulmonary embolus, asthma, emphysema)     No 9. PE RISK FACTORS: "Do you have a history of blood clots?" (or: recent major surgery, recent prolonged travel, bedridden)     Yes 15 years ago blood clot in legs 10. OTHER SYMPTOMS: "Do you have any other symptoms?" (e.g., runny nose, wheezing, chest pain)       Runny nose, body aches 11. PREGNANCY: "Is there any chance you are pregnant?" "When was your last menstrual period?"       N/A 12. TRAVEL: "Have you traveled out of the country in the last month?" (e.g., travel history, exposures)       No  Protocols used: COUGH - ACUTE PRODUCTIVE-A-AH

## 2018-08-26 NOTE — Telephone Encounter (Signed)
Spoke w/ Pt- informed that I also recommend he be seen in [place], informed him that he could try an E-visit however the provider may ask that he still see someone locally w/o prescribing any medications. He states that [facility] and they informed that there is only 1 urgent care in his area, I informed that there should be more urgent cares there whether they are in or out of network I didn't know. Pt became frustrated and stated he has an appt w/ Dr. Drue NovelPaz on 09/02/2018 at "he won't like what I have to say." I informed I would let PCP know, he wished for me to have a good day and hung up.

## 2018-08-30 ENCOUNTER — Ambulatory Visit (INDEPENDENT_AMBULATORY_CARE_PROVIDER_SITE_OTHER): Payer: Medicare Other | Admitting: Internal Medicine

## 2018-08-30 ENCOUNTER — Encounter: Payer: Self-pay | Admitting: Internal Medicine

## 2018-08-30 VITALS — BP 124/70 | HR 49 | Temp 97.7°F | Resp 16 | Ht 67.0 in | Wt 206.1 lb

## 2018-08-30 DIAGNOSIS — J40 Bronchitis, not specified as acute or chronic: Secondary | ICD-10-CM

## 2018-08-30 DIAGNOSIS — J019 Acute sinusitis, unspecified: Secondary | ICD-10-CM | POA: Diagnosis not present

## 2018-08-30 MED ORDER — AZELASTINE HCL 0.1 % NA SOLN: 2.0000 | Freq: Two times a day (BID) | NASAL | 6 refills | Status: DC

## 2018-08-30 MED ORDER — AMOXICILLIN 500 MG PO CAPS: 1000.0000 mg | ORAL_CAPSULE | Freq: Two times a day (BID) | ORAL | 0 refills | Status: DC

## 2018-08-30 NOTE — Progress Notes (Signed)
Subjective:    Patient ID: Timothy HarryGabriel G Beber Jr., male    DOB: 09-09-1940, 77 y.o.   MRN: [id number]  DOS:  08/30/2018 Type of visit - description: Acute visit sxs started approximately 8 days ago with hoarseness, severe cough with occasional sputum production, yellow in color. Malaise Sinus and face congestion with postnasal dripping. Some chest congestion. At some point he coughed up something red but he thinks is related to the red spray he used to help the symptoms. See phone note.  Review of Systems No fever chills.  No diff breathing, chest pain, wheezing. On looking back, his sinuses have been congested for much more than few days.    Past Medical History:  Diagnosis Date  . Actinic keratosis    hyperplastic aktinic keratosis forehead s/p surgery Dr Park LiterAlbertini  . BPH (benign prostatic hyperplasia)    s/p TUNA  . Chronic cystitis    Dr. Liliane ShiWinter  . DVT (deep venous thrombosis) (HCC) 2005   no know triggers per pt  . HTN (hypertension)    dx in the 70s  . OSA (obstructive sleep apnea)    on CPAP  . Recurrent UTI   . Umbilical hernia    no surgery  . Vitamin D deficiency     Past Surgical History:  Procedure Laterality Date  . PROSTATE SURGERY  2003   TUNA around 2003    Social History   Socioeconomic History  . Marital status: Married    Spouse name: Not on file  . Number of children: 4  . Years of education: Not on file  . Highest education level: Not on file  Occupational History  . 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 Financial resource strain: Not on file  . Food insecurity:    Worry: Not on file    Inability: Not on file  . Transportation needs:    Medical: Not on file    Non-medical: Not on file  Tobacco Use  . Smoking status: Never Smoker  . Smokeless tobacco: Never Used  Substance and Sexual Activity  . Alcohol use: Yes    Alcohol/week: 0.0 standard drinks   Comment: 2x per week  . Drug use: No  . Sexual activity: Not Currently  Lifestyle  . Physical activity:    Days per week: Not on file    Minutes per session: Not on file  . Stress: Not on file  Relationships  . Social connections:    Talks on phone: Not on file    Gets together: Not on file    Attends religious service: Not on file    Active member of club or organization: Not on file    Attends meetings of clubs or organizations: Not on file    Relationship status: Not on file  . Intimate partner violence:    Fear of current or ex partner: Not on file    Emotionally abused: Not on file    Physically abused: Not on file    Forced sexual activity: Not on file  Other Topics Concern  . Not on file  Social History Narrative   Lives w/ wife            Allergies as of 08/30/2018      Reactions   Hydrocodone Shortness Of Breath   Intolerant: felt bad, couldn't breath. No actual rash or itching   Oxycodone Other (See Comments)   Other reaction(s): Confusion (intolerance)  Latex Hives      Medication List       Accurate as of August 30, 2018 11:59 PM. Always use your most recent med list.        acetaminophen 500 MG tablet Commonly known as:  TYLENOL Take 500 mg by mouth as needed.   amoxicillin 500 MG capsule Commonly known as:  AMOXIL Take 2 capsules (1,000 mg total) by mouth 2 (two) times daily.   aspirin 81 MG tablet Take 81 mg by mouth daily.   azelastine 0.1 % nasal spray Commonly known as:  ASTELIN Place 2 sprays into both nostrils 2 (two) times daily.   bisoprolol-hydrochlorothiazide 5-6.25 MG tablet Commonly known as:  ZIAC Take 1 tablet by mouth daily. Okay to take a second tablet if BP more than 140/85   doxazosin 4 MG tablet Commonly known as:  CARDURA Take 1 tablet (4 mg total) by mouth at bedtime.   EXCEDRIN PO Take 1 tablet by mouth daily as needed (Headaches).   Flaxseed Oil Oil 1,400 mg daily.   fluticasone 50 MCG/ACT nasal  spray Commonly known as:  FLONASE Place 2 sprays into both nostrils daily as needed for allergies or rhinitis.   Ibuprofen 200 MG Caps Take by mouth as needed.   OVER THE COUNTER MEDICATION Prostate Plus Health Complex w/ Cran-Max-Cranberry 1 daily   Vitamin D3 50 MCG (2000 UT) Tabs Take 2,000 Units by mouth daily.           Objective:   Physical Exam BP 124/70 (BP Location: Left Arm, Patient Position: Sitting, Cuff Size: Normal)   Pulse (!) 49   Temp 97.7 F (36.5 C) (Oral)   Resp 16   Ht 5\' 7"  (1.702 m)   Wt 206 lb 2 oz (93.5 kg)   SpO2 98%   BMI 32.28 kg/m  General:   Well developed, NAD, BMI noted. HEENT: Hoarseness noted Normocephalic . Face symmetric, atraumatic.  TMs normal.  Nose slightly congested, sinuses no TTP.  Throat symmetric. Lungs:  + Rhonchi bilaterally with cough, no wheezing. Normal respiratory effort, no intercostal retractions, no accessory muscle use. Heart: bradycardiac,  no murmur.  No pretibial edema bilaterally  Skin: Not pale. Not jaundice Neurologic:  alert & oriented X3.  Speech normal, gait appropriate for age and unassisted Psych--  Cognition and judgment appear intact.  Cooperative with normal attention span and concentration.  Behavior appropriate. No anxious or depressed appearing.      Assessment      Assessment: Prediabetes HTN BPH, recurrent UTIs, chronic cystitis OSA, on CPAP Umbilical hernia Actinic keratosis; no derm eval recently as off 02/2018 Tinnitus  H/o vitamin D deficiency H/o DVT, 2005, unprovoked   PLAN: Bronchitis and  chronic/subacute sinusitis. sx treatment with Mucinex, Astelin, Flonase.  Start amoxicillin.  He has a follow-up in just 2 or 3 days and he will let me know how he is doing. PVCs: At the last visit was found  to be in bigeminy, a ECHO showed normal EF, a Holter showed 19% PVC burden. Saw cardiology 06/30/2018: They recommended exercise treadmill test.  Not done so far. We will  discuss further at his regular appointment in 2 to 3 days.

## 2018-08-30 NOTE — Patient Instructions (Addendum)
  For cough:  Take Mucinex DM twice a day as needed until better  For nasal congestion: Use OTC  Flonase : 2 nasal sprays on each side of the nose in the morning until you feel better Use ASTELIN a prescribed spray : 2 nasal sprays on each side of the nose twice a day until you feel better  claritin 10 mg once a day until better    Avoid decongestants such as  Pseudoephedrine or phenylephrine    Take the antibiotic as prescribed  (Amoxicillin)  Call if not gradually better    Call anytime if the symptoms are severe

## 2018-08-30 NOTE — Progress Notes (Signed)
Pre visit review using our clinic review tool, if applicable. No additional management support is needed unless otherwise documented below in the visit note. 

## 2018-09-01 ENCOUNTER — Ambulatory Visit: Payer: Self-pay | Admitting: Internal Medicine

## 2018-09-01 NOTE — Assessment & Plan Note (Signed)
Bronchitis and  chronic/subacute sinusitis. sx treatment with Mucinex, Astelin, Flonase.  Start amoxicillin.  He has a follow-up in just 2 or 3 days and he will let me know how he is doing. PVCs: At the last visit was found  to be in bigeminy, a ECHO showed normal EF, a Holter showed 19% PVC burden. Saw cardiology 06/30/2018: They recommended exercise treadmill test.  Not done so far. We will discuss further at his regular appointment in 2 to 3 days.

## 2018-09-02 ENCOUNTER — Encounter: Payer: Self-pay | Admitting: Internal Medicine

## 2018-09-02 ENCOUNTER — Ambulatory Visit (INDEPENDENT_AMBULATORY_CARE_PROVIDER_SITE_OTHER): Payer: Medicare Other | Admitting: Internal Medicine

## 2018-09-02 VITALS — BP 150/85 | HR 60 | Temp 98.0°F | Resp 16 | Ht 67.0 in | Wt 204.0 lb

## 2018-09-02 DIAGNOSIS — R739 Hyperglycemia, unspecified: Secondary | ICD-10-CM

## 2018-09-02 DIAGNOSIS — I493 Ventricular premature depolarization: Secondary | ICD-10-CM | POA: Diagnosis not present

## 2018-09-02 DIAGNOSIS — J40 Bronchitis, not specified as acute or chronic: Secondary | ICD-10-CM | POA: Diagnosis not present

## 2018-09-02 DIAGNOSIS — I10 Essential (primary) hypertension: Secondary | ICD-10-CM

## 2018-09-02 LAB — BASIC METABOLIC PANEL WITH GFR
BUN: 26 mg/dL — ABNORMAL HIGH (ref 6–23)
CO2: 26 meq/L (ref 19–32)
Calcium: 8.9 mg/dL (ref 8.4–10.5)
Chloride: 104 meq/L (ref 96–112)
Creatinine, Ser: 1.21 mg/dL (ref 0.40–1.50)
GFR: 61.6 mL/min
Glucose, Bld: 88 mg/dL (ref 70–99)
Potassium: 4.7 meq/L (ref 3.5–5.1)
Sodium: 139 meq/L (ref 135–145)

## 2018-09-02 LAB — HEMOGLOBIN A1C: Hgb A1c MFr Bld: 6.3 % (ref 4.6–6.5)

## 2018-09-02 LAB — MAGNESIUM: Magnesium: 2.2 mg/dL (ref 1.5–2.5)

## 2018-09-02 NOTE — Progress Notes (Signed)
Subjective:    Patient ID: Timothy Stone., male    DOB: [birth year], 77 y.o.   MRN: [id number]  DOS:  09/02/2018 Type of visit - description: rov HTN: Good med compliance, ambulatory BPs within normal Recently seen with bronchitis, improved.  Decreased cough.  BP Readings from Last 3 Encounters:  09/02/18 (!) 150/85  08/30/18 124/70  06/30/18 116/78    Review of Systems Denies chest pain, palpitations or lower extremity edema. Occasional shortness of breath, symptoms are stable, for years.  Past Medical History:  Diagnosis Date  . Actinic keratosis    hyperplastic aktinic keratosis forehead s/p surgery Dr Park LiterAlbertini  . BPH (benign prostatic hyperplasia)    s/p TUNA  . Chronic cystitis    Dr. Liliane ShiWinter  . DVT (deep venous thrombosis) ([facility]) 2005   no know triggers per pt  . HTN (hypertension)    dx in the 70s  . OSA (obstructive sleep apnea)    on CPAP  . Recurrent UTI   . Umbilical hernia    no surgery  . Vitamin D deficiency     Past Surgical History:  Procedure Laterality Date  . PROSTATE SURGERY  2003   TUNA around 2003    Social History   Socioeconomic History  . Marital status: Married    Spouse name: Not on file  . Number of children: 4  . Years of education: Not on file  . Highest education level: Not on file  Occupational History  . 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 Financial resource strain: Not on file  . Food insecurity:    Worry: Not on file    Inability: Not on file  . Transportation needs:    Medical: Not on file    Non-medical: Not on file  Tobacco Use  . Smoking status: Never Smoker  . Smokeless tobacco: Never Used  Substance and Sexual Activity  . Alcohol use: Yes    Alcohol/week: 0.0 standard drinks    Comment: 2x per week  . Drug use: No  . Sexual activity: Not Currently  Lifestyle  . Physical activity:    Days per week: Not on file    Minutes  per session: Not on file  . Stress: Not on file  Relationships  . Social connections:    Talks on phone: Not on file    Gets together: Not on file    Attends religious service: Not on file    Active member of club or organization: Not on file    Attends meetings of clubs or organizations: Not on file    Relationship status: Not on file  . Intimate partner violence:    Fear of current or ex partner: Not on file    Emotionally abused: Not on file    Physically abused: Not on file    Forced sexual activity: Not on file  Other Topics Concern  . Not on file  Social History Narrative   Lives w/ wife            Allergies as of 09/02/2018      Reactions   Hydrocodone Shortness Of Breath   Intolerant: felt bad, couldn't breath. No actual rash or itching   Oxycodone Other (See Comments)   Other reaction(s): Confusion (intolerance)   Latex Hives      Medication List       Accurate as of September 02, 2018 11:59 PM. Always use your  most recent med list.        acetaminophen 500 MG tablet Commonly known as:  TYLENOL Take 500 mg by mouth as needed.   amoxicillin 500 MG capsule Commonly known as:  AMOXIL Take 2 capsules (1,000 mg total) by mouth 2 (two) times daily.   aspirin 81 MG tablet Take 81 mg by mouth daily.   azelastine 0.1 % nasal spray Commonly known as:  ASTELIN Place 2 sprays into both nostrils 2 (two) times daily.   bisoprolol-hydrochlorothiazide 5-6.25 MG tablet Commonly known as:  ZIAC Take 1 tablet by mouth daily. Okay to take a second tablet if BP more than 140/85   doxazosin 4 MG tablet Commonly known as:  CARDURA Take 1 tablet (4 mg total) by mouth at bedtime.   EXCEDRIN PO Take 1 tablet by mouth daily as needed (Headaches).   Flaxseed Oil Oil 1,400 mg daily.   fluticasone 50 MCG/ACT nasal spray Commonly known as:  FLONASE Place 2 sprays into both nostrils daily as needed for allergies or rhinitis.   Ibuprofen 200 MG Caps Take by mouth as  needed.   OVER THE COUNTER MEDICATION Prostate Plus Health Complex w/ Cran-Max-Cranberry 1 daily   Vitamin D3 50 MCG (2000 UT) Tabs Take 2,000 Units by mouth daily.           Objective:   Physical Exam BP (!) 150/85 (BP Location: Left Arm, Patient Position: Sitting, Cuff Size: Small)   Pulse 60   Temp 98 F (36.7 C) (Oral)   Resp 16   Ht 5\' 7"  (1.702 m)   Wt 204 lb (92.5 kg)   SpO2 99%   BMI 31.95 kg/m  General:   Well developed, NAD, BMI noted. HEENT:  Normocephalic . Face symmetric, atraumatic Lungs:  CTA B Normal respiratory effort, no intercostal retractions, no accessory muscle use. Heart: RRR,  no murmur.  No pretibial edema bilaterally  Skin: Not pale. Not jaundice Neurologic:  alert & oriented X3.  Speech normal, gait appropriate for age and unassisted Psych--  Cognition and judgment appear intact.  Cooperative with normal attention span and concentration.  Behavior appropriate. No anxious or depressed appearing.      Assessment    Assessment: Prediabetes HTN BPH, recurrent UTIs, chronic cystitis OSA, on CPAP Umbilical hernia Actinic keratosis; no derm eval recently as off 02/2018 Tinnitus  H/o vitamin D deficiency H/o DVT, 2005, unprovoked   PLAN: Bronchitis, sinusitis: Improving Prediabetes: Check A1c HTN: Continue Ziac, Cardura, check a BMP PVCs: At the last visit was found  to be in bigeminy, a ECHO showed normal EF, a Holter showed 19% PVC burden. Saw cardiology 06/30/2018: They recommended exercise treadmill test.  Patient canceled the test because he could not figure out how much would cost.  Message sent to cardiology regards the issue . Check magnesium level. Preventive care: Willing to take Shingrix, we are out of it today.  Will provide at the next opportunity RTC 6 months CPX

## 2018-09-02 NOTE — Patient Instructions (Signed)
GO TO THE LAB : Get the blood work     GO TO THE FRONT DESK Schedule your next appointment for a  Physical exam in 6 months   Be sure your blood pressure is between 110/65 and  135/85. If it is consistently higher or lower, let me know

## 2018-09-05 NOTE — Assessment & Plan Note (Signed)
  Bronchitis, sinusitis: Improving Prediabetes: Check A1c HTN: Continue Ziac, Cardura, check a BMP PVCs: At the last visit was found  to be in bigeminy, a ECHO showed normal EF, a Holter showed 19% PVC burden. Saw cardiology 06/30/2018: They recommended exercise treadmill test.  Patient canceled the test because he could not figure out how much would cost.  Message sent to cardiology regards the issue . Check magnesium level. Preventive care: Willing to take Shingrix, we are out of it today.  Will provide at the next opportunity RTC 6 months CPX

## 2018-09-16 DIAGNOSIS — R400 Somnolence: Secondary | ICD-10-CM | POA: Diagnosis not present

## 2018-09-16 DIAGNOSIS — G4733 Obstructive sleep apnea (adult) (pediatric): Secondary | ICD-10-CM | POA: Diagnosis not present

## 2018-10-17 DIAGNOSIS — R400 Somnolence: Secondary | ICD-10-CM | POA: Diagnosis not present

## 2018-10-17 DIAGNOSIS — G4733 Obstructive sleep apnea (adult) (pediatric): Secondary | ICD-10-CM | POA: Diagnosis not present

## 2018-12-22 ENCOUNTER — Other Ambulatory Visit: Payer: Self-pay | Admitting: Internal Medicine

## 2018-12-29 DIAGNOSIS — R3914 Feeling of incomplete bladder emptying: Secondary | ICD-10-CM | POA: Diagnosis not present

## 2019-02-09 ENCOUNTER — Other Ambulatory Visit: Payer: Self-pay

## 2019-02-09 ENCOUNTER — Ambulatory Visit (INDEPENDENT_AMBULATORY_CARE_PROVIDER_SITE_OTHER): Payer: Medicare Other | Admitting: Internal Medicine

## 2019-02-09 ENCOUNTER — Ambulatory Visit: Payer: Self-pay

## 2019-02-09 DIAGNOSIS — I10 Essential (primary) hypertension: Secondary | ICD-10-CM

## 2019-02-09 DIAGNOSIS — R42 Dizziness and giddiness: Secondary | ICD-10-CM

## 2019-02-09 MED ORDER — MECLIZINE HCL 25 MG PO TABS: 25.0000 mg | ORAL_TABLET | Freq: Every evening | ORAL | 0 refills | Status: DC | PRN

## 2019-02-09 NOTE — Progress Notes (Signed)
Subjective:    Patient ID: Timothy Stone., male    DOB: Dec 25, 1940, 78 y.o.   MRN: [id number]  DOS:  02/09/2019 Type of visit - description: Virtual Visit via Video Note  I connected with@ on 02/10/19 at 10:20 AM EDT by a video enabled telemedicine application and verified that I am speaking with the correct person using two identifiers.   THIS ENCOUNTER IS A VIRTUAL VISIT DUE TO COVID-19 - PATIENT WAS NOT SEEN IN THE OFFICE. PATIENT HAS CONSENTED TO VIRTUAL VISIT / TELEMEDICINE VISIT   Location of patient: home  Location of provider: office  I discussed the limitations of evaluation and management by telemedicine and the availability of in person appointments. The patient expressed understanding and agreed to proceed.  History of Present Illness: Acute 2 days ago, he went to bed and lay down and developed severe dizziness described as a spinning, associated with some nausea and feeling of clamminess. Symptoms lasted for as long as he was lying down, they got better/resolved when he stands up or sit down.   Since then, he continue experiencing mild dizziness described as an "uneasiness" associated with mild nausea whenever he lays down.  He consistently feels better when he is sit up or stand up. I asked if the dizziness is worse when he moves his head and he said that he experiences mild dizziness with head movement that for years.  Nothing new. Ambulatory BPs normal Not taking any new medications   Review of Systems Denies fever chills No chest pain, palpitation or edema No abdominal pain, diarrhea or blood in the stools No headache, diplopia, slurred speech, motor deficits, face numbness. Gait is normal.  Past Medical History:  Diagnosis Date  . Actinic keratosis    hyperplastic aktinic keratosis forehead s/p surgery Dr Park Liter  . BPH (benign prostatic hyperplasia)    s/p TUNA  . Chronic cystitis    Dr. Liliane Shi  . DVT (deep venous thrombosis) ([facility]) 2005   no know  triggers per pt  . HTN (hypertension)    dx in the 70s  . OSA (obstructive sleep apnea)    on CPAP  . Recurrent UTI   . Umbilical hernia    no surgery  . Vitamin D deficiency     Past Surgical History:  Procedure Laterality Date  . PROSTATE SURGERY  2003   TUNA around 2003    Social History   Socioeconomic History  . Marital status: Married    Spouse name: Not on file  . Number of children: 4  . Years of education: Not on file  . Highest education level: Not on file  Occupational History  . 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 Financial resource strain: Not on file  . Food insecurity:    Worry: Not on file    Inability: Not on file  . Transportation needs:    Medical: Not on file    Non-medical: Not on file  Tobacco Use  . Smoking status: Never Smoker  . Smokeless tobacco: Never Used  Substance and Sexual Activity  . Alcohol use: Yes    Alcohol/week: 0.0 standard drinks    Comment: 2x per week  . Drug use: No  . Sexual activity: Not Currently  Lifestyle  . Physical activity:    Days per week: Not on file    Minutes per session: Not on file  . Stress: Not on file  Relationships  .  Social connections:    Talks on phone: Not on file    Gets together: Not on file    Attends religious service: Not on file    Active member of club or organization: Not on file    Attends meetings of clubs or organizations: Not on file    Relationship status: Not on file  . Intimate partner violence:    Fear of current or ex partner: Not on file    Emotionally abused: Not on file    Physically abused: Not on file    Forced sexual activity: Not on file  Other Topics Concern  . Not on file  Social History Narrative   Lives w/ wife            Allergies as of 02/09/2019      Reactions   Hydrocodone Shortness Of Breath   Intolerant: felt bad, couldn't breath. No actual rash or itching   Oxycodone  Other (See Comments)   Other reaction(s): Confusion (intolerance)   Latex Hives      Medication List       Accurate as of February 09, 2019 11:59 PM. If you have any questions, ask your nurse or doctor.        STOP taking these medications   amoxicillin 500 MG capsule Commonly known as:  AMOXIL Stopped by:  Willow OraJose Aiesha Leland, MD     TAKE these medications   acetaminophen 500 MG tablet Commonly known as:  TYLENOL Take 500 mg by mouth as needed.   aspirin 81 MG tablet Take 81 mg by mouth daily.   azelastine 0.1 % nasal spray Commonly known as:  ASTELIN Place 2 sprays into both nostrils 2 (two) times daily.   bisoprolol-hydrochlorothiazide 5-6.25 MG tablet Commonly known as:  ZIAC TAKE 1 TABLET BY MOUTH  DAILY. OKAY TO TAKE A  SECOND TABLET IF BP MORE  THAN 140/85   doxazosin 4 MG tablet Commonly known as:  CARDURA Take 1 tablet (4 mg total) by mouth at bedtime.   EXCEDRIN PO Take 1 tablet by mouth daily as needed (Headaches).   Flaxseed Oil Oil 1,400 mg daily.   fluticasone 50 MCG/ACT nasal spray Commonly known as:  FLONASE Place 2 sprays into both nostrils daily as needed for allergies or rhinitis.   Ibuprofen 200 MG Caps Take by mouth as needed.   meclizine 25 MG tablet Commonly known as:  ANTIVERT Take 1 tablet (25 mg total) by mouth at bedtime as needed for dizziness. Started by:  Willow OraJose Teddy Rebstock, MD   OVER THE COUNTER MEDICATION Prostate Plus Health Complex w/ Cran-Max-Cranberry 1 daily   Vitamin D3 50 MCG (2000 UT) Tabs Take 2,000 Units by mouth daily.           Objective:   Physical Exam There were no vitals taken for this visit. Is a virtual video visit.  Alert oriented x3, no apparent distress, coherent, speech is fluent, EOMI, face symmetric.    Assessment     Assessment: Prediabetes HTN BPH, recurrent UTIs, chronic cystitis OSA, on CPAP Umbilical hernia Actinic keratosis; no derm eval recently as off 02/2018 Tinnitus  H/o vitamin D deficiency H/o  DVT, 2005, unprovoked   PLAN: Dizziness: As described above, it is consistently triggered by changing position.  Most likely a peripheral issue. Recommend to rest, drink plenty fluids, continue his regular medications and take Antivert at night. Reassess next week ER or 911 if severe or persistent symptoms or any symptoms consistent with a stroke, he  verbalized understanding. HTN: Ambulatory BP 130s/60s.  No change PVCs: Reassess next week, work-up was never completed.     I discussed the assessment and treatment plan with the patient. The patient was provided an opportunity to ask questions and all were answered. The patient agreed with the plan and demonstrated an understanding of the instructions.   The patient was advised to call back or seek an in-person evaluation if the symptoms worsen or if the condition fails to improve as anticipated.

## 2019-02-09 NOTE — Telephone Encounter (Signed)
Pt. Called to report intermittent dizziness.  Reported the dizziness is mild at present, but got his attention starting Monday, when the room was spinning.  Described light-headed feeling and nausea at present.  Denied feeling room spinning at this time.  Denied blurred vision, facial droop, speech difficulties, confusion, or weakness/ numbness in extremities.  Also denied chest pain or shortness of breath.  Stated he has a stuffy head.  Reported this has been going on for a long time, and it takes approx. 2 hrs. In the morning to clear his head.  Stated he does not feel pressure or congestion in ears.  Reported he typically drinks 64 oz water and 2 cups of decaff. Coffee during day.  Has not been drinking as much water, since he has not been able to go to gym for past 2 mos.  Care advice given per protocol, verb. Understanding.  Advised of need for evaluation; transferred to office to receive appt.    Reason for Disposition . [1] MILD dizziness (e.g., walking normally) AND [2] has NOT been evaluated by physician for this  (Exception: dizziness caused by heat exposure, sudden standing, or poor fluid intake)  Answer Assessment - Initial Assessment Questions 1. DESCRIPTION: "Describe your dizziness."     "a little lightheaded and nausea  2. LIGHTHEADED: "Do you feel lightheaded?" (e.g., somewhat faint, woozy, weak upon standing)     Denied feeling faint; stated walking okay 3. VERTIGO: "Do you feel like either you or the room is spinning or tilting?" (i.e. vertigo)     Denied at present; reported it happened once 4. SEVERITY: "How bad is it?"  "Do you feel like you are going to faint?" "Can you stand and walk?"   - MILD - walking normally   - MODERATE - interferes with normal activities (e.g., work, school)    - SEVERE - unable to stand, requires support to walk, feels like passing out now.      Mild  5. ONSET:  "When did the dizziness begin?"    "It started Monday or Tuesday to the point it  concerned me." 6. AGGRAVATING FACTORS: "Does anything make it worse?" (e.g., standing, change in head position)     Feels change in position provokes it 7. HEART RATE: "Can you tell me your heart rate?" "How many beats in 15 seconds?"  (Note: not all patients can do this)       Reported BP 139/64 and pulse 64 @ 8:26 AM  8. CAUSE: "What do you think is causing the dizziness?"    Voiced concern that the lack of exercise over past 2 mos., may be contributing to it.  9. RECURRENT SYMPTOM: "Have you had dizziness before?" If so, ask: "When was the last time?" "What happened that time?"     Has had this in past with low blood pressure  10. OTHER SYMPTOMS: "Do you have any other symptoms?" (e.g., fever, chest pain, vomiting, diarrhea, bleeding)       Denied vision loss/ blurred vision, denied speech changes, disorientation, weakness/ numbness of extremities;  Reported has chronic intermittent  Headaches; c/o chronic stuffiness in head.  Denied shortness of breath or chest discomfort.   11. PREGNANCY: "Is there any chance you are pregnant?" "When was your last menstrual period?"       n/a  Protocols used: DIZZINESS [facility]

## 2019-02-10 NOTE — Assessment & Plan Note (Signed)
Dizziness: As described above, it is consistently triggered by changing position.  Most likely a peripheral issue. Recommend to rest, drink plenty fluids, continue his regular medications and take Antivert at night. Reassess next week ER or 911 if severe or persistent symptoms or any symptoms consistent with a stroke, he verbalized understanding. HTN: Ambulatory BP 130s/60s.  No change PVCs: Reassess next week, work-up was never completed.

## 2019-02-21 ENCOUNTER — Encounter: Payer: Self-pay | Admitting: Internal Medicine

## 2019-02-21 ENCOUNTER — Ambulatory Visit (INDEPENDENT_AMBULATORY_CARE_PROVIDER_SITE_OTHER): Payer: Medicare Other | Admitting: Internal Medicine

## 2019-02-21 ENCOUNTER — Other Ambulatory Visit: Payer: Self-pay

## 2019-02-21 VITALS — BP 131/60 | HR 33 | Temp 98.4°F | Resp 16 | Ht 67.0 in | Wt 206.1 lb

## 2019-02-21 DIAGNOSIS — I10 Essential (primary) hypertension: Secondary | ICD-10-CM

## 2019-02-21 DIAGNOSIS — R42 Dizziness and giddiness: Secondary | ICD-10-CM | POA: Diagnosis not present

## 2019-02-21 DIAGNOSIS — I499 Cardiac arrhythmia, unspecified: Secondary | ICD-10-CM | POA: Diagnosis not present

## 2019-02-21 DIAGNOSIS — I498 Other specified cardiac arrhythmias: Secondary | ICD-10-CM

## 2019-02-21 NOTE — Progress Notes (Signed)
Subjective:    Patient ID: Timothy Stone., male    DOB: Mar 09, 1941, 78 y.o.   MRN: [id number]  DOS:  02/21/2019 Type of visit - description: f/u Dizziness started 02/07/2019, subsequently he was seen virtually and is here today for an in person follow-up Again, symptoms are severe at times, on and off, described as a spinning, symptoms depend on his position, worse when he lays down, better if he turns to one side or the other.  Was recommended meclizine, it helps particularly at night, he gets slightly drowsy and is able to sleep better.  When asked, admits to some tinnitus, bilateral?  And also some hearing loss.   Review of Systems Denies fever chills No chest pain or palpitations No diplopia, headache, numbness or slurred speech.  Past Medical History:  Diagnosis Date  . Actinic keratosis    hyperplastic aktinic keratosis forehead s/p surgery Dr Link Snuffer  . BPH (benign prostatic hyperplasia)    s/p TUNA  . Chronic cystitis    Dr. Lovena Neighbours  . DVT (deep venous thrombosis) ([place]) 2005   no know triggers per pt  . HTN (hypertension)    dx in the 70s  . OSA (obstructive sleep apnea)    on CPAP  . Recurrent UTI   . Umbilical hernia    no surgery  . Vitamin D deficiency     Past Surgical History:  Procedure Laterality Date  . PROSTATE SURGERY  2003   TUNA around 2003    Social History   Socioeconomic History  . Marital status: Married    Spouse name: Not on file  . Number of children: 4  . Years of education: Not on file  . Highest education level: Not on file  Occupational History  . 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 Financial resource strain: Not on file  . Food insecurity    Worry: Not on file    Inability: Not on file  . Transportation needs    Medical: Not on file    Non-medical: Not on file  Tobacco Use  . Smoking status: Never Smoker  . Smokeless tobacco: Never Used   Substance and Sexual Activity  . Alcohol use: Yes    Alcohol/week: 0.0 standard drinks    Comment: 2x per week  . Drug use: No  . Sexual activity: Not Currently  Lifestyle  . Physical activity    Days per week: Not on file    Minutes per session: Not on file  . Stress: Not on file  Relationships  . Social Herbalist on phone: Not on file    Gets together: Not on file    Attends religious service: Not on file    Active member of club or organization: Not on file    Attends meetings of clubs or organizations: Not on file    Relationship status: Not on file  . Intimate partner violence    Fear of current or ex partner: Not on file    Emotionally abused: Not on file    Physically abused: Not on file    Forced sexual activity: Not on file  Other Topics Concern  . Not on file  Social History Narrative   Lives w/ wife            Allergies as of 02/21/2019      Reactions   Hydrocodone Shortness Of Breath   Intolerant: felt bad,  couldn't breath. No actual rash or itching   Oxycodone Other (See Comments)   Other reaction(s): Confusion (intolerance)   Latex Hives      Medication List       Accurate as of February 21, 2019 11:59 PM. If you have any questions, ask your nurse or doctor.        acetaminophen 500 MG tablet Commonly known as: TYLENOL Take 500 mg by mouth as needed.   aspirin 81 MG tablet Take 81 mg by mouth daily.   azelastine 0.1 % nasal spray Commonly known as: ASTELIN Place 2 sprays into both nostrils 2 (two) times daily.   bisoprolol-hydrochlorothiazide 5-6.25 MG tablet Commonly known as: ZIAC TAKE 1 TABLET BY MOUTH  DAILY. OKAY TO TAKE A  SECOND TABLET IF BP MORE  THAN 140/85   doxazosin 4 MG tablet Commonly known as: CARDURA Take 1 tablet (4 mg total) by mouth at bedtime.   EXCEDRIN PO Take 1 tablet by mouth daily as needed (Headaches).   Flaxseed Oil Oil 1,400 mg daily.   fluticasone 50 MCG/ACT nasal spray Commonly known as:  FLONASE Place 2 sprays into both nostrils daily as needed for allergies or rhinitis.   Ibuprofen 200 MG Caps Take by mouth as needed.   meclizine 25 MG tablet Commonly known as: ANTIVERT Take 1 tablet (25 mg total) by mouth at bedtime as needed for dizziness.   OVER THE COUNTER MEDICATION Prostate Plus Health Complex w/ Cran-Max-Cranberry 1 daily   Vitamin D3 50 MCG (2000 UT) Tabs Take 2,000 Units by mouth daily.           Objective:   Physical Exam BP 131/60 (BP Location: Right Arm, Patient Position: Sitting, Cuff Size: Normal)   Pulse (!) 33   Temp 98.4 F (36.9 C) (Oral)   Resp 16   Ht 5\' 7"  (1.702 m)   Wt 206 lb 2 oz (93.5 kg)   SpO2 97%   BMI 32.28 kg/m  General:   Well developed, NAD, BMI noted. HEENT:  Normocephalic . Face symmetric, atraumatic Neck: Normal carotid pulses bilaterally Lungs:  CTA B Normal respiratory effort, no intercostal retractions, no accessory muscle use. Heart: RRR,  no murmur.  No pretibial edema bilaterally  Skin: Not pale. Not jaundice Neurologic:  alert & oriented X3.  Speech normal, gait appropriate for age and unassisted EOMI, pupils equal and reactive, face symmetric, motor symmetric, normal DTRs. Psych--  Cognition and judgment appear intact.  Cooperative with normal attention span and concentration.  Behavior appropriate. No anxious or depressed appearing.      Assessment     Assessment: Prediabetes HTN BPH, recurrent UTIs, chronic cystitis OSA, on CPAP Umbilical hernia Actinic keratosis; no derm eval recently as off 02/2018 Tinnitus  H/o vitamin D deficiency H/o DVT, 2005, unprovoked   PLAN: Dizziness: As described above, most likely a peripheral issue.  It is associated with some tinnitus and decreased hearing, he has a history of noise exposure. Plan: Refer to ENT, he likely will benefit from formal audiology eval and possibly vestibular rehab.  Check a CBC.  Antivert as needed HTN: Continue with  Cardura,Ziac, check a BMP, FLP (nonfasting) Bigeminy: Recommend to complete cardiac work-up back initiated by cardiology. EKG today shows sinus rhythm, bigeminy.  Unchanged from previous thus doubt dizziness is cardiac in origin. Preventive care: We will do a CPX on RTC 3-4 months, do recommend a early flu shot this year. COVID-19?  Had a flulike illness December 2020, see previous notes,  he wonders if he had COVID-19, that is not impossible, however I do not see a point checking urology unless he is simply curious  Plan: Labs in few days CPX in 3 to 4 months

## 2019-02-21 NOTE — Progress Notes (Signed)
Pre visit review using our clinic review tool, if applicable. No additional management support is needed unless otherwise documented below in the visit note. 

## 2019-02-21 NOTE — Patient Instructions (Signed)
  GO TO THE FRONT DESK Schedule your next appointment for blood work in few days and a physical exam in 3 to 4 months (you can cancel the upcoming physical exam)   Get a high-dose flu shot earlier this year  Call if he has severe dizziness  Please call cardiology and complete their work-up.

## 2019-02-23 ENCOUNTER — Encounter: Payer: Self-pay | Admitting: Internal Medicine

## 2019-02-23 NOTE — Assessment & Plan Note (Signed)
Dizziness: As described above, most likely a peripheral issue.  It is associated with some tinnitus and decreased hearing, he has a history of noise exposure. Plan: Refer to ENT, he likely will benefit from formal audiology eval and possibly vestibular rehab.  Check a CBC.  Antivert as needed HTN: Continue with Cardura,Ziac, check a BMP, FLP (nonfasting) Bigeminy: Recommend to complete cardiac work-up back initiated by cardiology. EKG today shows sinus rhythm, bigeminy.  Unchanged from previous thus doubt dizziness is cardiac in origin. Preventive care: We will do a CPX on RTC 3-4 months, do recommend a early flu shot this year. COVID-19?  Had a flulike illness December 2020, see previous notes, he wonders if he had COVID-19, that is not impossible, however I do not see a point checking urology unless he is simply curious  Plan: Labs in few days CPX in 3 to 4 months

## 2019-02-28 ENCOUNTER — Other Ambulatory Visit: Payer: Self-pay

## 2019-02-28 ENCOUNTER — Other Ambulatory Visit (INDEPENDENT_AMBULATORY_CARE_PROVIDER_SITE_OTHER): Payer: Medicare Other

## 2019-02-28 DIAGNOSIS — I10 Essential (primary) hypertension: Secondary | ICD-10-CM

## 2019-02-28 LAB — CBC WITH DIFFERENTIAL/PLATELET
Basophils Absolute: 0 10*3/uL (ref 0.0–0.1)
Basophils Relative: 0.5 % (ref 0.0–3.0)
Eosinophils Absolute: 0.1 10*3/uL (ref 0.0–0.7)
Eosinophils Relative: 0.8 % (ref 0.0–5.0)
HCT: 45.3 % (ref 39.0–52.0)
Hemoglobin: 15.2 g/dL (ref 13.0–17.0)
Lymphocytes Relative: 41 % (ref 12.0–46.0)
Lymphs Abs: 2.7 10*3/uL (ref 0.7–4.0)
MCHC: 33.5 g/dL (ref 30.0–36.0)
MCV: 89.5 fl (ref 78.0–100.0)
Monocytes Absolute: 0.4 10*3/uL (ref 0.1–1.0)
Monocytes Relative: 5.5 % (ref 3.0–12.0)
Neutro Abs: 3.4 10*3/uL (ref 1.4–7.7)
Neutrophils Relative %: 52.2 % (ref 43.0–77.0)
Platelets: 207 10*3/uL (ref 150.0–400.0)
RBC: 5.06 Mil/uL (ref 4.22–5.81)
RDW: 13.9 % (ref 11.5–15.5)
WBC: 6.5 10*3/uL (ref 4.0–10.5)

## 2019-02-28 LAB — BASIC METABOLIC PANEL
BUN: 23 mg/dL (ref 6–23)
CO2: 27 mEq/L (ref 19–32)
Calcium: 9.2 mg/dL (ref 8.4–10.5)
Chloride: 101 mEq/L (ref 96–112)
Creatinine, Ser: 1.27 mg/dL (ref 0.40–1.50)
GFR: 54.74 mL/min — ABNORMAL LOW (ref >60.00)
Glucose, Bld: 111 mg/dL — ABNORMAL HIGH (ref 70–99)
Potassium: 4.7 mEq/L (ref 3.5–5.1)
Sodium: 137 mEq/L (ref 135–145)

## 2019-02-28 LAB — LIPID PANEL
Cholesterol: 164 mg/dL (ref 0–200)
HDL: 30.3 mg/dL — ABNORMAL LOW (ref >39.00)
NonHDL: 134
Total CHOL/HDL Ratio: 5
Triglycerides: 209 mg/dL — ABNORMAL HIGH (ref 0.0–149.0)
VLDL: 41.8 mg/dL — ABNORMAL HIGH (ref 0.0–40.0)

## 2019-02-28 LAB — LDL CHOLESTEROL, DIRECT: Direct LDL: 113 mg/dL

## 2019-03-03 ENCOUNTER — Other Ambulatory Visit: Payer: Self-pay | Admitting: Internal Medicine

## 2019-03-06 ENCOUNTER — Ambulatory Visit: Payer: Medicare Other | Admitting: Internal Medicine

## 2019-03-06 NOTE — Telephone Encounter (Signed)
Sent!

## 2019-03-06 NOTE — Telephone Encounter (Signed)
Refill request for meclizine.

## 2019-03-17 DIAGNOSIS — J301 Allergic rhinitis due to pollen: Secondary | ICD-10-CM | POA: Diagnosis not present

## 2019-03-17 DIAGNOSIS — R42 Dizziness and giddiness: Secondary | ICD-10-CM | POA: Diagnosis not present

## 2019-03-17 DIAGNOSIS — H903 Sensorineural hearing loss, bilateral: Secondary | ICD-10-CM | POA: Diagnosis not present

## 2019-04-28 DIAGNOSIS — H02834 Dermatochalasis of left upper eyelid: Secondary | ICD-10-CM | POA: Diagnosis not present

## 2019-04-28 DIAGNOSIS — H02831 Dermatochalasis of right upper eyelid: Secondary | ICD-10-CM | POA: Diagnosis not present

## 2019-04-28 DIAGNOSIS — H35033 Hypertensive retinopathy, bilateral: Secondary | ICD-10-CM | POA: Diagnosis not present

## 2019-04-28 DIAGNOSIS — H2513 Age-related nuclear cataract, bilateral: Secondary | ICD-10-CM | POA: Diagnosis not present

## 2019-05-23 ENCOUNTER — Other Ambulatory Visit: Payer: Self-pay

## 2019-05-23 ENCOUNTER — Ambulatory Visit: Payer: Medicare Other

## 2019-05-24 ENCOUNTER — Ambulatory Visit (INDEPENDENT_AMBULATORY_CARE_PROVIDER_SITE_OTHER): Payer: Medicare Other | Admitting: Internal Medicine

## 2019-05-24 ENCOUNTER — Encounter: Payer: Self-pay | Admitting: Internal Medicine

## 2019-05-24 ENCOUNTER — Other Ambulatory Visit: Payer: Self-pay

## 2019-05-24 VITALS — BP 125/55 | HR 34 | Temp 95.5°F | Resp 16 | Ht 67.0 in | Wt 204.4 lb

## 2019-05-24 DIAGNOSIS — R739 Hyperglycemia, unspecified: Secondary | ICD-10-CM | POA: Diagnosis not present

## 2019-05-24 DIAGNOSIS — E781 Pure hyperglyceridemia: Secondary | ICD-10-CM

## 2019-05-24 DIAGNOSIS — Z0000 Encounter for general adult medical examination without abnormal findings: Secondary | ICD-10-CM

## 2019-05-24 DIAGNOSIS — I10 Essential (primary) hypertension: Secondary | ICD-10-CM | POA: Diagnosis not present

## 2019-05-24 DIAGNOSIS — Z23 Encounter for immunization: Secondary | ICD-10-CM | POA: Diagnosis not present

## 2019-05-24 LAB — COMPREHENSIVE METABOLIC PANEL
ALT: 17 U/L (ref 0–53)
AST: 16 U/L (ref 0–37)
Albumin: 4.4 g/dL (ref 3.5–5.2)
Alkaline Phosphatase: 53 U/L (ref 39–117)
BUN: 29 mg/dL — ABNORMAL HIGH (ref 6–23)
CO2: 25 mEq/L (ref 19–32)
Calcium: 9.8 mg/dL (ref 8.4–10.5)
Chloride: 103 mEq/L (ref 96–112)
Creatinine, Ser: 1.37 mg/dL (ref 0.40–1.50)
GFR: 50.13 mL/min — ABNORMAL LOW (ref >60.00)
Glucose, Bld: 124 mg/dL — ABNORMAL HIGH (ref 70–99)
Potassium: 5 mEq/L (ref 3.5–5.1)
Sodium: 138 mEq/L (ref 135–145)
Total Bilirubin: 0.8 mg/dL (ref 0.2–1.2)
Total Protein: 7.2 g/dL (ref 6.0–8.3)

## 2019-05-24 LAB — HEMOGLOBIN A1C: Hgb A1c MFr Bld: 6.5 % (ref 4.6–6.5)

## 2019-05-24 MED ORDER — SHINGRIX 50 MCG/0.5ML IM SUSR: 0.5000 mL | Freq: Once | INTRAMUSCULAR | 1 refills | Status: AC

## 2019-05-24 NOTE — Progress Notes (Signed)
Subjective:    Patient ID: Timothy HarryGabriel G Bacote Jr., male    DOB: 02/12/1941, 78 y.o.   MRN: [id number]  DOS:  05/24/2019 Type of visit - description: CPX In general feeling well   Review of Systems Dizziness is resolved Tinnitus is mild, persistent, not bothersome. Sleep is sometimes broken, not using his CPAP mostly due to nasal congestion.  Other than above, a 14 point review of systems is negative    Past Medical History:  Diagnosis Date  . Actinic keratosis    hyperplastic aktinic keratosis forehead s/p surgery Dr Park LiterAlbertini  . BPH (benign prostatic hyperplasia)    s/p TUNA  . Chronic cystitis    Dr. Liliane ShiWinter  . DVT (deep venous thrombosis) (HCC) 2005   no know triggers per pt  . HTN (hypertension)    dx in the 70s  . OSA (obstructive sleep apnea)    on CPAP  . Recurrent UTI   . Umbilical hernia    no surgery  . Vitamin D deficiency     Past Surgical History:  Procedure Laterality Date  . PROSTATE SURGERY  2003   TUNA around 2003    Social History   Socioeconomic History  . Marital status: Married    Spouse name: Not on file  . Number of children: 4  . Years of education: Not on file  . Highest education level: Not on file  Occupational History  . 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 Financial resource strain: Not on file  . Food insecurity    Worry: Not on file    Inability: Not on file  . Transportation needs    Medical: Not on file    Non-medical: Not on file  Tobacco Use  . Smoking status: Never Smoker  . Smokeless tobacco: Never Used  Substance and Sexual Activity  . Alcohol use: Yes    Alcohol/week: 0.0 standard drinks    Comment: 2x per week  . Drug use: No  . Sexual activity: Not Currently  Lifestyle  . Physical activity    Days per week: Not on file    Minutes per session: Not on file  . Stress: Not on file  Relationships  . Social Musicianconnections    Talks on  phone: Not on file    Gets together: Not on file    Attends religious service: Not on file    Active member of club or organization: Not on file    Attends meetings of clubs or organizations: Not on file    Relationship status: Not on file  . Intimate partner violence    Fear of current or ex partner: Not on file    Emotionally abused: Not on file    Physically abused: Not on file    Forced sexual activity: Not on file  Other Topics Concern  . Not on file  Social History Narrative   Lives w/ wife           Family History  Problem Relation Age of Onset  . Diabetes Mother        Judie PetitM, sister  . Heart attack Father [id number]  . Breast cancer Sister   . Rectal cancer Sister [id number]       @ time of her first Cscope  . Lung cancer Brother [phone]       smoker   . Prostate cancer Neg Hx     Allergies as of 05/24/2019  Reactions   Hydrocodone Shortness Of Breath   Intolerant: felt bad, couldn't breath. No actual rash or itching   Oxycodone Other (See Comments)   Other reaction(s): Confusion (intolerance)   Latex Hives      Medication List       Accurate as of May 24, 2019 11:59 PM. If you have any questions, ask your nurse or doctor.        acetaminophen 500 MG tablet Commonly known as: TYLENOL Take 500 mg by mouth as needed.   aspirin 81 MG tablet Take 81 mg by mouth daily.   azelastine 0.1 % nasal spray Commonly known as: ASTELIN Place 2 sprays into both nostrils 2 (two) times daily.   bisoprolol-hydrochlorothiazide 5-6.25 MG tablet Commonly known as: ZIAC TAKE 1 TABLET BY MOUTH  DAILY. OKAY TO TAKE A  SECOND TABLET IF BP MORE  THAN 140/85   Coricidin HBP Day/Night Cold 10-20 &15-200-2 MG Misc Generic drug: DM-GG & DM-APAP-CPM Take 1 tablet by mouth daily as needed (allergies).   doxazosin 4 MG tablet Commonly known as: CARDURA Take 1 tablet (4 mg total) by mouth at bedtime.   EXCEDRIN PO Take 1 tablet by mouth daily as needed (Headaches).   Flaxseed Oil Oil  1,400 mg daily.   fluticasone 50 MCG/ACT nasal spray Commonly known as: FLONASE Place 2 sprays into both nostrils daily as needed for allergies or rhinitis.   Ibuprofen 200 MG Caps Take by mouth as needed.   meclizine 25 MG tablet Commonly known as: ANTIVERT TAKE 1 TABLET (25 MG TOTAL) BY MOUTH AT BEDTIME AS NEEDED FOR DIZZINESS.   OVER THE COUNTER MEDICATION Prostate Plus Health Complex w/ Cran-Max-Cranberry 1 daily   Shingrix injection Generic drug: Zoster Vaccine Adjuvanted Inject 0.5 mLs into the muscle once for 1 dose. Started by: Kathlene November, MD   Vitamin D3 50 MCG (2000 UT) Tabs Take 2,000 Units by mouth daily.           Objective:   Physical Exam BP (!) 125/55 (BP Location: Left Arm, Patient Position: Sitting, Cuff Size: Small)   Pulse (!) 34   Temp (!) 95.5 F (35.3 C) (Temporal)   Resp 16   Ht 5\' 7"  (1.702 m)   Wt 204 lb 6 oz (92.7 kg)   SpO2 99%   BMI 32.01 kg/m  General: Well developed, NAD, BMI noted Neck: No  thyromegaly  HEENT:  Normocephalic . Face symmetric, atraumatic Lungs:  CTA B Normal respiratory effort, no intercostal retractions, no accessory muscle use. Heart: Heart rate by pulse 40,  no murmur.  No pretibial edema bilaterally  Abdomen:  Not distended, soft, non-tender. No rebound or rigidity.   Skin: Exposed areas without rash. Not pale. Not jaundice Neurologic:  alert & oriented X3.  Speech normal, gait appropriate for age and unassisted Strength symmetric and appropriate for age.  Psych: Cognition and judgment appear intact.  Cooperative with normal attention span and concentration.  Behavior appropriate. No anxious or depressed appearing.     Assessment      Assessment: Prediabetes HTN Bigeminy: Saw cardiology 2019, Holter: 80% QRS with PVCs.  Echo was done, see report BPH, recurrent UTIs, chronic cystitis OSA, on CPAP Umbilical hernia Actinic keratosis; no derm eval recently as off 02/2018 Tinnitus  H/o vitamin D  deficiency H/o DVT, 2005, unprovoked   PLAN: Prediabetes: Check A1c HTN: Seems controlled, no change Hyperlipidemia: Based on last cholesterol his CV RF 10 years is 34%, he would benefit from statins, this  was discussed with the patient, he will think about it. OSA: Not using CPAP regularly for few months due to nasal congestion, sleep is broken, strongly recommend to go back on his CPAP now that the nasal congestion has improved, okay to take melatonin nightly Dizziness: Saw ENT, audiology, was told might need hearing aids, dizzines gone, has tinnitus but he is "used to it" Tinnitus : see above , s/p ENT eval  RTC 6 months

## 2019-05-24 NOTE — Patient Instructions (Addendum)
Please schedule Medicare Wellness with Glenard Haring.    GO TO THE LAB : Get the blood work     GO TO THE FRONT DESK Schedule your next appointment in 6 months        [email or web address]

## 2019-05-24 NOTE — Assessment & Plan Note (Addendum)
--  Td 2015 - pneumonia shot 02/2018 - prevnar 02-2014 -  Zostavax:2011 - shingrex discussed, RX printed -Flu shot today --CCS:  Cscope @ [facility] 11-08-2001 Dr Dorrene German: small internal Hemorrhoids. Cscope 05-12-2012, 1 polyp, tubular adenoma, Cscope again 03/30/17, no report --DRE PSA: Sees urology --On ASA 81 mg daily  --Labs: CMP, A1c --lifestyle: gym x 5/week, diet discussed , room for improvment, patient educated

## 2019-05-24 NOTE — Progress Notes (Signed)
Pre visit review using our clinic review tool, if applicable. No additional management support is needed unless otherwise documented below in the visit note. 

## 2019-05-25 NOTE — Assessment & Plan Note (Signed)
Prediabetes: Check A1c HTN: Seems controlled, no change Hyperlipidemia: Based on last cholesterol his CV RF 10 years is 34%, he would benefit from statins, this was discussed with the patient, he will think about it. OSA: Not using CPAP regularly for few months due to nasal congestion, sleep is broken, strongly recommend to go back on his CPAP now that the nasal congestion has improved, okay to take melatonin nightly Dizziness: Saw ENT, audiology, was told might need hearing aids, dizzines gone, has tinnitus but he is "used to it" Tinnitus : see above , s/p ENT eval  RTC 6 months

## 2019-06-06 ENCOUNTER — Other Ambulatory Visit: Payer: Self-pay | Admitting: Internal Medicine

## 2019-07-18 ENCOUNTER — Other Ambulatory Visit: Payer: Self-pay | Admitting: Internal Medicine

## 2019-07-20 NOTE — Progress Notes (Addendum)
Subjective:   Timothy Stone. is a 78 y.o. male who presents for Medicare Annual/Subsequent preventive examination.  Review of Systems:  Home Safety/Smoke Alarms: Feels safe in home. Smoke alarms in place.  Lives w/ wife in 1 story home w/ bonus room.  Male:   CCS-  2013.   PSA-  Lab Results  Component Value Date   PSA 2.36 05/29/2015        Objective:    Vitals: BP 124/60 (BP Location: Left Arm, Patient Position: Sitting, Cuff Size: Large)    Pulse (!) 32    Temp (!) 96.8 F (36 C) (Oral)    Ht 5\' 7"  (1.702 m)    Wt 208 lb 9.6 oz (94.6 kg)    SpO2 98%    BMI 32.67 kg/m   Body mass index is 32.67 kg/m.   Pt's HR=32bpm. Pt is asymptomatic. Pt states he feels completely normal. PCP notified and came to room to assess pt. Pt added onto PCP schedule for EKG and further assessment.   Advanced Directives 07/21/2019 04/29/2018 04/26/2017 04/06/2016  Does Patient Have a Medical Advance Directive? No No No No  Would patient like information on creating a medical advance directive? No - Patient declined No - Patient declined Yes (MAU/Ambulatory/Procedural Areas - Information given) Yes - Educational materials given    Tobacco Social History   Tobacco Use  Smoking Status Never Smoker  Smokeless Tobacco Never Used     Counseling given: Not Answered   Clinical Intake: Pain : No/denies pain     Past Medical History:  Diagnosis Date   Actinic keratosis    hyperplastic aktinic keratosis forehead s/p surgery Dr 04/08/2016   BPH (benign prostatic hyperplasia)    s/p TUNA   Chronic cystitis    Dr. Park Liter   DVT (deep venous thrombosis) (HCC) 2005   no know triggers per pt   HTN (hypertension)    dx in the 70s   OSA (obstructive sleep apnea)    on CPAP   Recurrent UTI    Umbilical hernia    no surgery   Vitamin D deficiency    Past Surgical History:  Procedure Laterality Date   PROSTATE SURGERY  2003   TUNA around 2003   Family History  Problem Relation  Age of Onset   Diabetes Mother        M, sister   Heart attack Father 28   Breast cancer Sister    Rectal cancer Sister 29       @ time of her first Cscope   Lung cancer Brother 55       smoker    Prostate cancer Neg Hx    Social History   Socioeconomic History   Marital status: Married    Spouse name: Not on file   Number of children: 4   Years of education: Not on file   Highest education level: Not on file  Occupational History   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67, Music therapist, 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  Social Art gallery manager strain: Not on file   Food insecurity    Worry: Not on file    Inability: Not on file   Transportation needs    Medical: Not on file    Non-medical: Not on file  Tobacco Use   Smoking status: Never Smoker   Smokeless tobacco: Never Used  Substance and Sexual Activity   Alcohol use: Yes    Alcohol/week: 0.0 standard drinks  Comment: 2x per week   Drug use: No   Sexual activity: Not Currently  Lifestyle   Physical activity    Days per week: Not on file    Minutes per session: Not on file   Stress: Not on file  Relationships   Social connections    Talks on phone: Not on file    Gets together: Not on file    Attends religious service: Not on file    Active member of club or organization: Not on file    Attends meetings of clubs or organizations: Not on file    Relationship status: Not on file  Other Topics Concern   Not on file  Social History Narrative   Lives w/ wife          Outpatient Encounter Medications as of 07/21/2019  Medication Sig   acetaminophen (TYLENOL) 500 MG tablet Take 500 mg by mouth as needed.     aspirin 81 MG tablet Take 81 mg by mouth daily.   Aspirin-Acetaminophen-Caffeine (EXCEDRIN PO) Take 1 tablet by mouth daily as needed (Headaches).   bisoprolol-hydrochlorothiazide (ZIAC) 5-6.25 MG tablet Take 1 tablet by mouth daily. Okay to take a second  tablet if BP more than 140/85   Cholecalciferol (VITAMIN D3) 2000 units TABS Take 2,000 Units by mouth daily.    DM-GG & DM-APAP-CPM (CORICIDIN HBP DAY/NIGHT COLD) 10-20 &15-200-2 MG MISC Take 1 tablet by mouth daily as needed (allergies).   doxazosin (CARDURA) 4 MG tablet Take 1 tablet (4 mg total) by mouth at bedtime.   Flaxseed Oil OIL 1,400 mg daily.    fluticasone (FLONASE) 50 MCG/ACT nasal spray Place 2 sprays into both nostrils daily as needed for allergies or rhinitis.   Ibuprofen 200 MG CAPS Take by mouth as needed.     OVER THE COUNTER MEDICATION Prostate Plus Health Complex w/ Cran-Max-Cranberry 1 daily   azelastine (ASTELIN) 0.1 % nasal spray Place 2 sprays into both nostrils 2 (two) times daily. (Patient not taking: Reported on 07/21/2019)   meclizine (ANTIVERT) 25 MG tablet TAKE 1 TABLET (25 MG TOTAL) BY MOUTH AT BEDTIME AS NEEDED FOR DIZZINESS. (Patient not taking: Reported on 07/21/2019)   No facility-administered encounter medications on file as of 07/21/2019.     Activities of Daily Living In your present state of health, do you have any difficulty performing the following activities: 07/21/2019 05/24/2019  Hearing? N N  Vision? N N  Difficulty concentrating or making decisions? N N  Walking or climbing stairs? N N  Dressing or bathing? N N  Doing errands, shopping? N N  Preparing Food and eating ? N -  Using the Toilet? N -  In the past six months, have you accidently leaked urine? N -  Do you have problems with loss of bowel control? N -  Managing your Medications? N -  Managing your Finances? N -  Some recent data might be hidden    Patient Care Team: Colon Branch, MD as PCP - General Lovena Neighbours Conception Oms, MD as Consulting Physician (Urology) Ralene Ok, MD as Consulting Physician (General Surgery) Marica Otter, [place] as Consulting Physician (Optometry) Laurance Flatten Velora Heckler., MD as Consulting Physician (Otolaryngology)   Assessment:   This is a  routine wellness examination for [facility]. Physical assessment deferred to PCP.   Exercise Activities and Dietary recommendations Current Exercise Habits: Home exercise routine, Type of exercise: treadmill;strength training/weights, Time (Minutes): 60, Frequency (Times/Week): 6, Weekly Exercise (Minutes/Week): 360, Exercise limited by: None  identified   Diet (meal preparation, eat out, water intake, caffeinated beverages, dairy products, fruits and vegetables): in general, a "healthy" diet  , well balanced    Goals     Patient Stated (pt-stated)     Lose 15 lbs.     Patient Stated     Maintain health active lifestyle.        Fall Risk Fall Risk  07/21/2019 05/24/2019 04/29/2018 04/26/2017 04/06/2016  Falls in the past year? 0 0 No No Yes  Comment - - - - Pt fell replacing a light bulb near the ground in his yard. His sprinklers turned on and started him and he lost his balance.  Number falls in past yr: 0 - - - 1  Injury with Fall? 0 - - - No  Follow up - Falls evaluation completed - - -   Depression Screen PHQ 2/9 Scores 07/21/2019 05/24/2019 04/29/2018 04/26/2017  PHQ - 2 Score 0 0 0 0    Cognitive Function Ad8 score reviewed for issues:  Issues making decisions:no  Less interest in hobbies / activities:no  Repeats questions, stories (family complaining):no  Trouble using ordinary gadgets (microwave, computer, phone):no  Forgets the month or year: no  Mismanaging finances: no  Remembering appts:no  Daily problems with thinking and/or memory:no Ad8 score is=0     MMSE - Mini Mental State Exam 04/26/2017 04/06/2016  Orientation to time 5 5  Orientation to Place 5 5  Registration 3 3  Attention/ Calculation 5 5  Recall 3 3  Language- name 2 objects 2 2  Language- repeat 1 1  Language- follow 3 step command 3 3  Language- read & follow direction 1 1  Write a sentence 1 1  Copy design 1 1  Total score 30 30        Immunization History  Administered  Date(s) Administered   Fluad Quad(high Dose 65+) 05/24/2019   Influenza Split 06/07/2014   Influenza Whole 07/28/2007, 05/26/2010   Influenza, High Dose Seasonal PF 07/02/2015, 05/27/2016, 06/17/2017, 06/16/2018   Pneumococcal Conjugate-13 02/16/2014   Pneumococcal Polysaccharide-23 05/18/2008, 03/02/2018   Td 09/08/2003   Tetanus 02/16/2014   Zoster 05/26/2010    Screening Tests Health Maintenance  Topic Date Due   COLONOSCOPY  03/30/2022   TETANUS/TDAP  02/17/2024   INFLUENZA VACCINE  Completed   PNA vac Low Risk Adult  Completed      Plan:    Please schedule your next medicare wellness visit with me in 1 yr.  Continue to eat heart healthy diet (full of fruits, vegetables, whole grains, lean protein, water--limit salt, fat, and sugar intake) and increase physical activity as tolerated.  Continue doing brain stimulating activities (puzzles, reading, adult coloring books, staying active) to keep memory sharp.    I have personally reviewed and noted the following in the patients chart:    Medical and social history  Use of alcohol, tobacco or illicit drugs   Current medications and supplements  Functional ability and status  Nutritional status  Physical activity  Advanced directives  List of other physicians  Hospitalizations, surgeries, and ER visits in previous 12 months  Vitals  Screenings to include cognitive, depression, and falls  Referrals and appointments  In addition, I have reviewed and discussed with patient certain preventive protocols, quality metrics, and best practice recommendations. A written personalized care plan for preventive services as well as general preventive health recommendations were provided to patient.     Avon Gully, [place]  07/21/2019  Pulse was in the 30s, EKG showed bigeminy, not a new issue.  He feels well.  No further evaluation needed. Willow OraJose Paz, MD

## 2019-07-21 ENCOUNTER — Ambulatory Visit (INDEPENDENT_AMBULATORY_CARE_PROVIDER_SITE_OTHER): Payer: Medicare Other | Admitting: *Deleted

## 2019-07-21 ENCOUNTER — Other Ambulatory Visit: Payer: Self-pay

## 2019-07-21 ENCOUNTER — Encounter: Payer: Self-pay | Admitting: *Deleted

## 2019-07-21 ENCOUNTER — Ambulatory Visit (INDEPENDENT_AMBULATORY_CARE_PROVIDER_SITE_OTHER): Payer: Medicare Other | Admitting: Internal Medicine

## 2019-07-21 VITALS — BP 124/60 | HR 32 | Temp 96.8°F | Resp 12 | Ht 67.0 in | Wt 208.0 lb

## 2019-07-21 VITALS — BP 124/60 | HR 32 | Temp 96.8°F | Ht 67.0 in | Wt 208.6 lb

## 2019-07-21 DIAGNOSIS — Z0000 Encounter for general adult medical examination without abnormal findings: Secondary | ICD-10-CM | POA: Diagnosis not present

## 2019-07-21 DIAGNOSIS — R001 Bradycardia, unspecified: Secondary | ICD-10-CM

## 2019-07-21 NOTE — Patient Instructions (Signed)
Please schedule your next medicare wellness visit with me in 1 yr.  Continue to eat heart healthy diet (full of fruits, vegetables, whole grains, lean protein, water--limit salt, fat, and sugar intake) and increase physical activity as tolerated.  Continue doing brain stimulating activities (puzzles, reading, adult coloring books, staying active) to keep memory sharp.    Timothy Stone , Thank you for taking time to come for your Medicare Wellness Visit. I appreciate your ongoing commitment to your health goals. Please review the following plan we discussed and let me know if I can assist you in the future.   These are the goals we discussed: Goals    . Patient Stated (pt-stated)     Lose 15 lbs.    . Patient Stated     Maintain health active lifestyle.        This is a list of the screening recommended for you and due dates:  Health Maintenance  Topic Date Due  . Colon Cancer Screening  03/30/2022  . Tetanus Vaccine  02/17/2024  . Flu Shot  Completed  . Pneumonia vaccines  Completed    Preventive Care 18 Years and Older, Male Preventive care refers to lifestyle choices and visits with your health care provider that can promote health and wellness. This includes:  A yearly physical exam. This is also called an annual well check.  Regular dental and eye exams.  Immunizations.  Screening for certain conditions.  Healthy lifestyle choices, such as diet and exercise. What can I expect for my preventive care visit? Physical exam Your health care provider will check:  Height and weight. These may be used to calculate body mass index (BMI), which is a measurement that tells if you are at a healthy weight.  Heart rate and blood pressure.  Your skin for abnormal spots. Counseling Your health care provider may ask you questions about:  Alcohol, tobacco, and drug use.  Emotional well-being.  Home and relationship well-being.  Sexual activity.  Eating habits.  History of  falls.  Memory and ability to understand (cognition).  Work and work Statistician. What immunizations do I need?  Influenza (flu) vaccine  This is recommended every year. Tetanus, diphtheria, and pertussis (Tdap) vaccine  You may need a Td booster every 10 years. Varicella (chickenpox) vaccine  You may need this vaccine if you have not already been vaccinated. Zoster (shingles) vaccine  You may need this after age 6. Pneumococcal conjugate (PCV13) vaccine  One dose is recommended after age 44. Pneumococcal polysaccharide (PPSV23) vaccine  One dose is recommended after age 73. Measles, mumps, and rubella (MMR) vaccine  You may need at least one dose of MMR if you were born in 1957 or later. You may also need a second dose. Meningococcal conjugate (MenACWY) vaccine  You may need this if you have certain conditions. Hepatitis A vaccine  You may need this if you have certain conditions or if you travel or work in places where you may be exposed to hepatitis A. Hepatitis B vaccine  You may need this if you have certain conditions or if you travel or work in places where you may be exposed to hepatitis B. Haemophilus influenzae type b (Hib) vaccine  You may need this if you have certain conditions. You may receive vaccines as individual doses or as more than one vaccine together in one shot (combination vaccines). Talk with your health care provider about the risks and benefits of combination vaccines. What tests do I need? Blood  tests  Lipid and cholesterol levels. These may be checked every 5 years, or more frequently depending on your overall health.  Hepatitis C test.  Hepatitis B test. Screening  Lung cancer screening. You may have this screening every year starting at age 30 if you have a 30-pack-year history of smoking and currently smoke or have quit within the past 15 years.  Colorectal cancer screening. All adults should have this screening starting at age 21  and continuing until age 11. Your health care provider may recommend screening at age 67 if you are at increased risk. You will have tests every 1-10 years, depending on your results and the type of screening test.  Prostate cancer screening. Recommendations will vary depending on your family history and other risks.  Diabetes screening. This is done by checking your blood sugar (glucose) after you have not eaten for a while (fasting). You may have this done every 1-3 years.  Abdominal aortic aneurysm (AAA) screening. You may need this if you are a current or former smoker.  Sexually transmitted disease (STD) testing. Follow these instructions at home: Eating and drinking  Eat a diet that includes fresh fruits and vegetables, whole grains, lean protein, and low-fat dairy products. Limit your intake of foods with high amounts of sugar, saturated fats, and salt.  Take vitamin and mineral supplements as recommended by your health care provider.  Do not drink alcohol if your health care provider tells you not to drink.  If you drink alcohol: ? Limit how much you have to 0-2 drinks a day. ? Be aware of how much alcohol is in your drink. In the [place] one drink equals one 12 oz bottle of beer (355 mL), one 5 oz glass of wine (148 mL), or one 1 oz glass of hard liquor (44 mL). Lifestyle  Take daily care of your teeth and gums.  Stay active. Exercise for at least 30 minutes on 5 or more days each week.  Do not use any products that contain nicotine or tobacco, such as cigarettes, e-cigarettes, and chewing tobacco. If you need help quitting, ask your health care provider.  If you are sexually active, practice safe sex. Use a condom or other form of protection to prevent STIs (sexually transmitted infections).  Talk with your health care provider about taking a low-dose aspirin or statin. What's next?  Visit your health care provider once a year for a well check visit.  Ask your health care  provider how often you should have your eyes and teeth checked.  Stay up to date on all vaccines. This information is not intended to replace advice given to you by your health care provider. Make sure you discuss any questions you have with your health care provider. Document Released: 09/20/2015 Document Revised: 08/18/2018 Document Reviewed: 08/18/2018 Elsevier Patient Education  2020 [employer].

## 2019-07-22 NOTE — Progress Notes (Signed)
Noted to be bradycardic at the time of his wellness visit.  He is completely asymptomatic. EKG today: [place].  At baseline. No further intervention is needed Kathlene November, MD

## 2019-08-11 ENCOUNTER — Other Ambulatory Visit: Payer: Self-pay

## 2019-08-11 ENCOUNTER — Ambulatory Visit: Payer: Self-pay

## 2019-08-11 ENCOUNTER — Emergency Department (HOSPITAL_BASED_OUTPATIENT_CLINIC_OR_DEPARTMENT_OTHER): Payer: Medicare Other

## 2019-08-11 ENCOUNTER — Encounter (HOSPITAL_COMMUNITY): Payer: Self-pay

## 2019-08-11 ENCOUNTER — Emergency Department (HOSPITAL_COMMUNITY)
Admission: EM | Admit: 2019-08-11 | Discharge: 2019-08-11 | Disposition: A | Discharge status: Home / Self Care | Payer: Medicare Other | Attending: Emergency Medicine | Admitting: Emergency Medicine

## 2019-08-11 DIAGNOSIS — Z91040 Latex allergy status: Secondary | ICD-10-CM | POA: Diagnosis not present

## 2019-08-11 DIAGNOSIS — M7989 Other specified soft tissue disorders: Secondary | ICD-10-CM

## 2019-08-11 DIAGNOSIS — Z7982 Long term (current) use of aspirin: Secondary | ICD-10-CM | POA: Diagnosis not present

## 2019-08-11 DIAGNOSIS — I824Z1 Acute embolism and thrombosis of unspecified deep veins of right distal lower extremity: Secondary | ICD-10-CM | POA: Diagnosis not present

## 2019-08-11 DIAGNOSIS — I82401 Acute embolism and thrombosis of unspecified deep veins of right lower extremity: Secondary | ICD-10-CM | POA: Diagnosis not present

## 2019-08-11 DIAGNOSIS — Z79899 Other long term (current) drug therapy: Secondary | ICD-10-CM | POA: Diagnosis not present

## 2019-08-11 DIAGNOSIS — M79604 Pain in right leg: Secondary | ICD-10-CM | POA: Diagnosis present

## 2019-08-11 DIAGNOSIS — R52 Pain, unspecified: Secondary | ICD-10-CM | POA: Diagnosis not present

## 2019-08-11 DIAGNOSIS — I10 Essential (primary) hypertension: Secondary | ICD-10-CM | POA: Insufficient documentation

## 2019-08-11 MED ORDER — APIXABAN (ELIQUIS) EDUCATION KIT FOR DVT/PE PATIENTS
PACK | Freq: Once | Status: AC
  Administered 2019-08-11: 19:00:00
  Filled 2019-08-11: qty 1

## 2019-08-11 MED ORDER — DICLOFENAC SODIUM 1 % EX GEL: 2.0000 g | Freq: Four times a day (QID) | CUTANEOUS | 1 refills | Status: DC

## 2019-08-11 MED ORDER — APIXABAN 5 MG PO TABS: ORAL_TABLET | ORAL | 0 refills | Status: DC

## 2019-08-11 MED ORDER — APIXABAN 5 MG PO TABS
10.0000 mg | ORAL_TABLET | Freq: Once | ORAL | Status: AC
  Administered 2019-08-11: 10 mg via ORAL
  Filled 2019-08-11: qty 2

## 2019-08-11 NOTE — ED Notes (Signed)
Dr. Plunkett at bedside.  

## 2019-08-11 NOTE — ED Notes (Signed)
Pt ambulatory to and from bathroom.  

## 2019-08-11 NOTE — ED Provider Notes (Signed)
[facility]-EMERGENCY DEPT Provider Note   CSN: [id number] Arrival date & time: 08/11/19  1642     History   Chief Complaint Chief Complaint  Patient presents with   Leg Pain    HPI Burnett Timothy G Ofallon Jr. is a 78 y.o. male.     The history is provided by the patient.  Leg Pain Location:  Leg Time since incident:  3 days Injury: no   Leg location:  R lower leg Pain details:    Quality:  Throbbing and aching   Radiates to:  Does not radiate   Severity:  Moderate   Onset quality:  Gradual   Duration:  3 days   Timing:  Constant   Progression:  Unchanged Chronicity:  New Foreign body present:  No foreign bodies Prior injury to area:  No Relieved by:  NSAIDs Worsened by:  Activity and flexion Ineffective treatments:  None tried Associated symptoms: no decreased ROM, no muscle weakness, no numbness and no swelling   Risk factors comment:  Prior hx of DVT 15 years ago without known cause.  no longer on anticoagulation   Past Medical History:  Diagnosis Date   Actinic keratosis    hyperplastic aktinic keratosis forehead s/p surgery Dr Park LiterAlbertini   BPH (benign prostatic hyperplasia)    s/p TUNA   Chronic cystitis    Dr. Liliane ShiWinter   DVT (deep venous thrombosis) (HCC) 2005   no know triggers per pt   HTN (hypertension)    dx in the 70s   OSA (obstructive sleep apnea)    on CPAP   Recurrent UTI    Umbilical hernia    no surgery   Vitamin D deficiency     Patient Active Problem List   Diagnosis Date Noted   PVC (premature ventricular contraction) 06/29/2018   PCP NOTES >>>>>>>>>>>>>>>>>>>> 02/12/2016   Umbilical hernia 11/01/2012   Hyperglycemia 01/20/2012   Annual physical exam 08/10/2011   BPH (benign prostatic hyperplasia)    Recurrent UTI    HYPERTRIGLYCERIDEMIA 11/16/2008   Obstructive sleep apnea 03/07/2008   Essential hypertension 02/21/2007   ALLERGIC RHINITIS 02/21/2007    Past Surgical History:  Procedure  Laterality Date   PROSTATE SURGERY  2003   TUNA around 2003        Home Medications    Prior to Admission medications   Medication Sig Start Date End Date Taking? Authorizing Provider  acetaminophen (TYLENOL) 500 MG tablet Take 500 mg by mouth as needed.      [provider]  aspirin 81 MG tablet Take 81 mg by mouth daily.    [provider]  Aspirin-Acetaminophen-Caffeine (EXCEDRIN PO) Take 1 tablet by mouth daily as needed (Headaches).    [provider]  azelastine (ASTELIN) 0.1 % nasal spray Place 2 sprays into both nostrils 2 (two) times daily. Patient not taking: Reported on 07/21/2019 08/30/18   Wanda PlumpPaz, Jose E, MD  bisoprolol-hydrochlorothiazide [facility](ZIAC) 5-6.25 MG tablet Take 1 tablet by mouth daily. Okay to take a second tablet if BP more than 140/85 07/18/19   Wanda PlumpPaz, Jose E, MD  Cholecalciferol (VITAMIN D3) 2000 units TABS Take 2,000 Units by mouth daily.     [provider]  DM-GG & DM-APAP-CPM (CORICIDIN HBP DAY/NIGHT COLD) 10-20 &15-200-2 MG MISC Take 1 tablet by mouth daily as needed (allergies).    [provider]  doxazosin (CARDURA) 4 MG tablet Take 1 tablet (4 mg total) by mouth at bedtime. 11/29/17   Wanda PlumpPaz, Jose E,  MD  Flaxseed Oil OIL 1,400 mg daily.     [provider]  fluticasone (FLONASE) 50 MCG/ACT nasal spray Place 2 sprays into both nostrils daily as needed for allergies or rhinitis. 06/06/19   Wanda Plump, MD  Ibuprofen 200 MG CAPS Take by mouth as needed.      [provider]  meclizine (ANTIVERT) 25 MG tablet TAKE 1 TABLET (25 MG TOTAL) BY MOUTH AT BEDTIME AS NEEDED FOR DIZZINESS. Patient not taking: Reported on 07/21/2019 03/06/19   Wanda Plump, MD  OVER THE COUNTER MEDICATION Prostate Plus Health Complex w/ Cran-Max-Cranberry 1 daily    [provider]    Family History Family History  Problem Relation Age of Onset   Diabetes Mother        M, sister   Heart attack Father 1   Breast  cancer Sister    Rectal cancer Sister 21       @ time of her first Cscope   Lung cancer Brother 28       smoker    Prostate cancer Neg Hx     Social History Social History   Tobacco Use   Smoking status: Never Smoker   Smokeless tobacco: Never Used  Substance Use Topics   Alcohol use: Yes    Alcohol/week: 0.0 standard drinks    Comment: 2x per week   Drug use: No     Allergies   Hydrocodone, Oxycodone, and Latex   Review of Systems Review of Systems  All other systems reviewed and are negative.    Physical Exam Updated Vital Signs BP (!) 150/82 (BP Location: Left Arm)    Pulse 64    Temp 97.8 F (36.6 C) (Oral)    Resp 16    Ht 5\' 7"  (1.702 m)    Wt 92.5 kg    SpO2 99%    BMI 31.95 kg/m   Physical Exam Vitals signs and nursing note reviewed.  Constitutional:      General: He is not in acute distress.    Appearance: Normal appearance. He is well-developed and normal weight.  HENT:     Head: Normocephalic and atraumatic.  Eyes:     Conjunctiva/sclera: Conjunctivae normal.     Pupils: Pupils are equal, round, and reactive to light.  Neck:     Musculoskeletal: Normal range of motion and neck supple.  Cardiovascular:     Rate and Rhythm: Normal rate and regular rhythm.     Pulses: Normal pulses.     Heart sounds: No murmur.  Pulmonary:     Effort: Pulmonary effort is normal. No respiratory distress.     Breath sounds: Normal breath sounds. No wheezing or rales.  Musculoskeletal: Normal range of motion.        General: Tenderness present.     Right knee: Normal.     Right lower leg: He exhibits tenderness. No edema.     Left lower leg: No edema.       Legs:  Skin:    General: Skin is warm and dry.     Capillary Refill: Capillary refill takes less than 2 seconds.     Findings: No erythema or rash.  Neurological:     General: No focal deficit present.     Mental Status: He is alert and oriented to person, place, and time. Mental status is at  baseline.  Psychiatric:        Mood and Affect: Mood normal.  Behavior: Behavior normal.        Thought Content: Thought content normal.      ED Treatments / Results  Labs (all labs ordered are listed, but only abnormal results are displayed) Labs Reviewed - No data to display  EKG None  Radiology Vas [place] Lower Extremity Venous (dvt) (only Mc & Wl 7a-7p)  Result Date: 08/11/2019  Lower Venous Study Indications: Swelling, and Pain.  Comparison Study: no prior Performing Technologist: Blanch Media RVS  Examination Guidelines: A complete evaluation includes B-mode imaging, spectral Doppler, color Doppler, and power Doppler as needed of all accessible portions of each vessel. Bilateral testing is considered an integral part of a complete examination. Limited examinations for reoccurring indications may be performed as noted.  +---------+---------------+---------+-----------+----------+--------------+  RIGHT     Compressibility Phasicity Spontaneity Properties Thrombus Aging  +---------+---------------+---------+-----------+----------+--------------+  CFV       Full            Yes       Yes                                    +---------+---------------+---------+-----------+----------+--------------+  SFJ       Full                                                             +---------+---------------+---------+-----------+----------+--------------+  FV Prox   Full                                                             +---------+---------------+---------+-----------+----------+--------------+  FV Mid    Full                                                             +---------+---------------+---------+-----------+----------+--------------+  FV Distal Full                                                             +---------+---------------+---------+-----------+----------+--------------+  PFV       Full                                                              +---------+---------------+---------+-----------+----------+--------------+  POP       None            No        No                     Acute           +---------+---------------+---------+-----------+----------+--------------+  PTV       None                                             Acute           +---------+---------------+---------+-----------+----------+--------------+  PERO      None                                             Acute           +---------+---------------+---------+-----------+----------+--------------+   +----+---------------+---------+-----------+----------+--------------+  LEFT Compressibility Phasicity Spontaneity Properties Thrombus Aging  +----+---------------+---------+-----------+----------+--------------+  CFV  Full            Yes       Yes                                    +----+---------------+---------+-----------+----------+--------------+     Summary: Right: Findings consistent with acute deep vein thrombosis involving the right peroneal veins, right posterior tibial veins, and right popliteal vein. No cystic structure found in the popliteal fossa. Left: No evidence of common femoral vein obstruction.  *See table(s) above for measurements and observations.    Preliminary     Procedures Procedures (including critical care time)  Medications Ordered in ED Medications - No data to display   Initial Impression / Assessment and Plan / ED Course  I have reviewed the triage vital signs and the nursing notes.  Pertinent labs & imaging results that were available during my care of the patient were reviewed by me and considered in my medical decision making (see chart for details).        Elderly male presenting today with 3 days of right calf pain.  Patient does have a history of DVT 15 years ago without a trigger.  He took anticoagulation for a while but has been off for some time.  He denies any injury and he does have right calf tenderness.  No significant swelling  of the leg and pulse are intact.  Patient denies any chest pain or shortness of breath.  He is otherwise well-appearing.  Doppler ultrasound to rule out DVT pending.  6:17 PM Patient found to have positive DVT in the right lower extremity.  Right peroneal, posterior tibial and popliteal vein involved.  No evidence of femoral obstruction.  Patient started on Eliquis as he does not appear to have any contraindication.  He had labs done in September that showed renal function with a creatinine of 1.36 and GFR 50.  Pharmacy to do patient education prior to discharge and will have him follow-up with PCP.  Final Clinical Impressions(s) / ED Diagnoses   Final diagnoses:  Acute deep vein thrombosis (DVT) of distal vein of right lower extremity [facility])    ED Discharge Orders         Ordered    apixaban (ELIQUIS) 5 MG TABS tablet     08/11/19 1836           Blanchie Dessert, MD 08/11/19 1836

## 2019-08-11 NOTE — Progress Notes (Signed)
Lower extremity venous has been completed.   Preliminary results in CV Proc.   Abram Sander 08/11/2019 5:56 PM

## 2019-08-11 NOTE — ED Triage Notes (Signed)
Patient reports right lower leg pain x 3-4 days. Patient reports a history of blood clots.

## 2019-08-11 NOTE — Discharge Instructions (Signed)
Continue to wear your compression stockings and start taking the blood thinner.  You can discontinue the daily aspirin.  Information on my medicine - ELIQUIS (apixaban)  This medication education was reviewed with me or my healthcare representative as part of my discharge preparation.  The pharmacist that spoke with me during my hospital stay was:  Wenceslao Loper A, RPH  Why was Eliquis prescribed for you? Eliquis was prescribed to treat blood clots that may have been found in the veins of your legs (deep vein thrombosis) or in your lungs (pulmonary embolism) and to reduce the risk of them occurring again.  What do You need to know about Eliquis ? The starting dose is 10 mg (two 5 mg tablets) taken TWICE daily for the FIRST SEVEN (7) DAYS, then on (enter date)  08/18/19  the dose is reduced to ONE 5 mg tablet taken TWICE daily.  Eliquis may be taken with or without food.   Try to take the dose about the same time in the morning and in the evening. If you have difficulty swallowing the tablet whole please discuss with your pharmacist how to take the medication safely.  Take Eliquis exactly as prescribed and DO NOT stop taking Eliquis without talking to the doctor who prescribed the medication.  Stopping may increase your risk of developing a new blood clot.  Refill your prescription before you run out.  After discharge, you should have regular check-up appointments with your healthcare provider that is prescribing your Eliquis.    What do you do if you miss a dose? If a dose of ELIQUIS is not taken at the scheduled time, take it as soon as possible on the same day and twice-daily administration should be resumed. The dose should not be doubled to make up for a missed dose.  Important Safety Information A possible side effect of Eliquis is bleeding. You should call your healthcare provider right away if you experience any of the following: ? Bleeding from an injury or your nose that  does not stop. ? Unusual colored urine (red or dark brown) or unusual colored stools (red or black). ? Unusual bruising for unknown reasons. ? A serious fall or if you hit your head (even if there is no bleeding).  Some medicines may interact with Eliquis and might increase your risk of bleeding or clotting while on Eliquis. To help avoid this, consult your healthcare provider or pharmacist prior to using any new prescription or non-prescription medications, including herbals, vitamins, non-steroidal anti-inflammatory drugs (NSAIDs) and supplements.  This website has more information on Eliquis (apixaban): http://www.eliquis.com/eliquis/home

## 2019-08-11 NOTE — ED Notes (Signed)
Pt sitting in chair, wife at bedside. No distress noted.

## 2019-08-11 NOTE — Telephone Encounter (Signed)
Patient called and reported that he has had right lower calf pain for 3-4 days. He has bee treating the pain with Ibuprofen. He has no swelling or redness. In the area. He has not injured his leg. He states that he has had a blood clot in this leg in the past. Care advice read to patient. He verbalized understanding. He will go to ER for evaluation. No appointments are available at the office.  Reason for Disposition . [1] Thigh or calf pain AND [2] only 1 side AND [3] present > 1 hour  Answer Assessment - Initial Assessment Questions 1. ONSET: "When did the pain start?"      3-4 days ago 2. LOCATION: "Where is the pain located?"    Rt leg lower calf to just above ankle 3. PAIN: "How bad is the pain?"    (Scale 1-10; or mild, moderate, severe)   -  MILD (1-3): doesn't interfere with normal activities    -  MODERATE (4-7): interferes with normal activities (e.g., work or school) or awakens from sleep, limping    -  SEVERE (8-10): excruciating pain, unable to do any normal activities, unable to walk     5 4. WORK OR EXERCISE: "Has there been any recent work or exercise that involved this part of the body?"     Gym worked out as usual 5. CAUSE: "What do you think is causing the leg pain?"     Unsure has had blood clot in leg in past 6. OTHER SYMPTOMS: "Do you have any other symptoms?" (e.g., chest pain, back pain, breathing difficulty, swelling, rash, fever, numbness, weakness)     none 7. PREGNANCY: "Is there any chance you are pregnant?" "When was your last menstrual period?"    N/A  Protocols used: LEG PAIN-A-AH

## 2019-08-12 ENCOUNTER — Telehealth: Payer: Self-pay | Admitting: Internal Medicine

## 2019-08-12 NOTE — Telephone Encounter (Signed)
Please schedule an appointment for ER follow-up within the next few days

## 2019-08-14 ENCOUNTER — Other Ambulatory Visit: Payer: Self-pay

## 2019-08-15 ENCOUNTER — Ambulatory Visit (INDEPENDENT_AMBULATORY_CARE_PROVIDER_SITE_OTHER): Payer: Medicare Other | Admitting: Internal Medicine

## 2019-08-15 ENCOUNTER — Other Ambulatory Visit: Payer: Self-pay

## 2019-08-15 ENCOUNTER — Encounter: Payer: Self-pay | Admitting: Internal Medicine

## 2019-08-15 VITALS — BP 144/85 | HR 35 | Temp 95.4°F | Resp 18 | Ht 67.0 in | Wt 210.2 lb

## 2019-08-15 DIAGNOSIS — I824Z1 Acute embolism and thrombosis of unspecified deep veins of right distal lower extremity: Secondary | ICD-10-CM | POA: Diagnosis not present

## 2019-08-15 DIAGNOSIS — Z09 Encounter for follow-up examination after completed treatment for conditions other than malignant neoplasm: Secondary | ICD-10-CM

## 2019-08-15 MED ORDER — APIXABAN 5 MG PO TABS: 5.0000 mg | ORAL_TABLET | Freq: Two times a day (BID) | ORAL | 0 refills | Status: DC

## 2019-08-15 NOTE — Progress Notes (Signed)
Subjective:    Patient ID: Timothy HarryGabriel G Difrancesco Jr., male    DOB: 03-Dec-1940, 78 y.o.   MRN: [id number]  DOS:  08/15/2019 Type of visit - description: ED follow-up On 08/08/2019 noted pain and swelling at the right leg/calf,   swelling was  more noticeable around the ankles. Went to the ER 08/11/2019, ultrasound show a DVT, he was started on Eliquis. Good compliance, no apparent problems.  Swelling and pain have decreased    Review of Systems No fever chills No chest pain no difficulty breathing No palpitations No recent prolonged trips, no recent bedrest. Since he has started Eliquis, he denies headache, stomach pain, blood in the urine or stools.  Past Medical History:  Diagnosis Date  . Actinic keratosis    hyperplastic aktinic keratosis forehead s/p surgery Dr Park LiterAlbertini  . BPH (benign prostatic hyperplasia)    s/p TUNA  . Chronic cystitis    Dr. Liliane ShiWinter  . DVT (deep venous thrombosis) (HCC) 2005   no know triggers per pt  . HTN (hypertension)    dx in the 70s  . OSA (obstructive sleep apnea)    on CPAP  . Recurrent UTI   . Umbilical hernia    no surgery  . Vitamin D deficiency     Past Surgical History:  Procedure Laterality Date  . PROSTATE SURGERY  2003   TUNA around 2003    Social History   Socioeconomic History  . Marital status: Married    Spouse name: Not on file  . Number of children: 4  . Years of education: Not on file  . Highest education level: Not on file  Occupational History  . 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 Financial resource strain: Not on file  . Food insecurity    Worry: Not on file    Inability: Not on file  . Transportation needs    Medical: Not on file    Non-medical: Not on file  Tobacco Use  . Smoking status: Never Smoker  . Smokeless tobacco: Never Used  Substance and Sexual Activity  . Alcohol use: Yes    Alcohol/week: 0.0 standard drinks   Comment: 2x per week  . Drug use: No  . Sexual activity: Not Currently  Lifestyle  . Physical activity    Days per week: Not on file    Minutes per session: Not on file  . Stress: Not on file  Relationships  . Social Musicianconnections    Talks on phone: Not on file    Gets together: Not on file    Attends religious service: Not on file    Active member of club or organization: Not on file    Attends meetings of clubs or organizations: Not on file    Relationship status: Not on file  . Intimate partner violence    Fear of current or ex partner: Not on file    Emotionally abused: Not on file    Physically abused: Not on file    Forced sexual activity: Not on file  Other Topics Concern  . Not on file  Social History Narrative   Lives w/ wife            Allergies as of 08/15/2019      Reactions   Hydrocodone Shortness Of Breath   Intolerant: felt bad, couldn't breath. No actual rash or itching   Oxycodone Other (See Comments)   Other reaction(s): Confusion (intolerance)  Latex Hives      Medication List       Accurate as of August 15, 2019 11:38 AM. If you have any questions, ask your nurse or doctor.        acetaminophen 500 MG tablet Commonly known as: TYLENOL Take 500 mg by mouth as needed for mild pain or headache.   apixaban 5 MG Tabs tablet Commonly known as: Eliquis Take 2 tablets (10mg ) twice daily for 7 days, then 1 tablet (5mg ) twice daily   azelastine 0.1 % nasal spray Commonly known as: ASTELIN Place 2 sprays into both nostrils 2 (two) times daily.   bisoprolol-hydrochlorothiazide 5-6.25 MG tablet Commonly known as: ZIAC Take 1 tablet by mouth daily. Okay to take a second tablet if BP more than 140/85   Coricidin HBP Day/Night Cold 10-20 &15-200-2 MG Misc Generic drug: DM-GG & DM-APAP-CPM Take 1 tablet by mouth daily as needed (allergies).   diclofenac Sodium 1 % Gel Commonly known as: VOLTAREN Apply 2 g topically 4 (four) times daily.    doxazosin 4 MG tablet Commonly known as: CARDURA Take 1 tablet (4 mg total) by mouth at bedtime.   EXCEDRIN PO Take 1 tablet by mouth daily as needed (Headaches).   Flaxseed Oil Oil Take 1,400 mg by mouth daily.   fluticasone 50 MCG/ACT nasal spray Commonly known as: FLONASE Place 2 sprays into both nostrils daily as needed for allergies or rhinitis.   meclizine 25 MG tablet Commonly known as: ANTIVERT TAKE 1 TABLET (25 MG TOTAL) BY MOUTH AT BEDTIME AS NEEDED FOR DIZZINESS.   OVER THE COUNTER MEDICATION Prostate Plus Health Complex w/ Cran-Max-Cranberry 1 daily   Vitamin D3 50 MCG (2000 UT) Tabs Take 2,000 Units by mouth daily.           Objective:   Physical Exam BP (!) 144/85 (BP Location: Left Arm, Patient Position: Sitting, Cuff Size: Normal)   Pulse (!) 35   Temp (!) 95.4 F (35.2 C) (Temporal)   Resp 18   Ht 5\' 7"  (1.702 m)   Wt 210 lb 4 oz (95.4 kg)   SpO2 100%   BMI 32.93 kg/m  General:   Well developed, NAD, BMI noted. HEENT:  Normocephalic . Face symmetric, atraumatic Lungs:  CTA B Normal respiratory effort, no intercostal retractions, no accessory muscle use. Heart: RRR,  no murmur.  Calves are now symmetric and nontender.  He uses a compression stockings Skin: Not pale. Not jaundice Neurologic:  alert & oriented X3.  Speech normal, gait appropriate for age and unassisted Psych--  Cognition and judgment appear intact.  Cooperative with normal attention span and concentration.  Behavior appropriate. No anxious or depressed appearing.      Assessment      Assessment: Prediabetes HTN Bigeminy: Saw cardiology 2019, Holter: 80% QRS with PVCs.  Echo was done, see report BPH, recurrent UTIs, chronic cystitis OSA, on CPAP Umbilical hernia Actinic keratosis; no derm eval recently as off 02/2018 Tinnitus  H/o vitamin D deficiency H/o R leg  DVT, 2005 and 2020, unprovoked   PLAN: Unprovoked DVT, right leg: This is a second time he has an  unprovoked DVT and the right leg.  Currently with no evidence of DVT extension or PE. I educated the patient about the use of anticoagulants, watch for bleeding. Avoid NSAIDs, Tylenol is okay Referred to hematology to determine the length needed for anticoagulation and the dosing. Follow-up as scheduled for 11-2019.   This visit occurred during the SARS-CoV-2 public  health emergency.  Safety protocols were in place, including screening questions prior to the visit, additional usage of staff PPE, and extensive cleaning of exam room while observing appropriate contact time as indicated for disinfecting solutions.

## 2019-08-15 NOTE — Patient Instructions (Signed)
Will refer you to the hematology clinic

## 2019-08-15 NOTE — Progress Notes (Signed)
Pre visit review using our clinic review tool, if applicable. No additional management support is needed unless otherwise documented below in the visit note. 

## 2019-08-16 NOTE — Assessment & Plan Note (Signed)
Unprovoked DVT, right leg: This is a second time he has an unprovoked DVT and the right leg.  Currently with no evidence of DVT extension or PE. I educated the patient about the use of anticoagulants, watch for bleeding. Avoid NSAIDs, Tylenol is okay Referred to hematology to determine the length needed for anticoagulation and the dosing. Follow-up as scheduled for 11-2019.

## 2019-08-18 NOTE — Progress Notes (Signed)
[facility] CONSULT NOTE  Patient Care Team: Wanda Plump, MD as PCP - General Liliane Shi, Dorian Furnace, MD as Consulting Physician (Urology) Axel Filler, MD as Consulting Physician (General Surgery) Blima Ledger, OD as Consulting Physician (Optometry) Christell Constant Rowe Robert., MD as Consulting Physician (Otolaryngology)  HEME/ONC OVERVIEW: 1. Acute RLE DVT, unprovoked -2015: superficial venous thromboses in the RLE; no DVT  -08/2019: acute DVT involving R peroneal, posterior tibial, and popliteal veins  On Eliquis 5mg  BID   ASSESSMENT & PLAN:   Acute RLE DVT, unprovoked -I reviewed the patient's records in detail, including ED and PCP notes, lab studies and imaging results -In summary, patient has a remote history of DVT in early 2000's and superficial venous thromboses in the RLE in 2015.  He presented to [facility] ED in early 08/2019 for three days of progressive right lower extremity swelling, and doppler showed an acute DVT involving the R peroneal, posterior tibial, and popliteal veins.  He was started on Eliquis and discharged from the ED.  Patient was referred to hematology for further evaluation and management. -I reviewed with the patient about the plan for care for DVT.  -This last episode of blood clot appeared to be unprovoked. We discussed about the pros and cons about testing for thrombophilia disorder. His current anticoagulation therapy will interfere with some the tests and it is not possible to interpret the test results. Taking him off the anticoagulation therapy to do the tests may precipitate another thrombotic event. I do not see a reason to order additional testing to screen for thrombophilia disorder as it would not change our management.  Given the recurrent DVT's and without any major contraindications, the goal of anticoagulation therapy is lifelong.  -As he is tolerating Eliquis well, we will plan for 6 months of Eliquis.  If no evidence of recurrent DVT  after 3 months, we can consider reducing Eliquis dose to 2.5mg  BID for secondary prophylaxis, which not only may reduce the risk of recurrent VTE's, but also lower the risk of bleeding.  -I recommend the patient to use elastic compression stockings at 20-30 mmHg to reduce risks of chronic thrombophlebitis. -Finally, I reinforced the importance of preventive strategies such as avoiding hormonal supplement, avoiding cigarette smoking, keeping up-to-date with screening programs for early cancer detection, frequent ambulation for long distance travel and aggressive DVT prophylaxis in all surgical settings. -Should he need any interruption of the anticoagulation for elective procedures in the future, feel free to contact me regarding peri-operative management.  Stage III CKD -Cr ~1.2-1.3 since at least 2018 -Cr 1.37 today; electrolytes normal -I recommended the patient to follow up with PCP for further management  Orders Placed This Encounter  Procedures  . CBC w/ diff    Standing Status:   Future    Standing Expiration Date:   09/25/2020  . CMP    Standing Status:   Future    Standing Expiration Date:   09/25/2020  . D-dimer    Standing Status:   Future    Standing Expiration Date:   09/25/2020   All questions were answered. The patient knows to call the clinic with any problems, questions or concerns.  Return in ~5 months for labs and clinic follow-up, and to assess feasibility to transition to Eliquis 2.5mg  BID.   09/27/2020, MD 08/22/2019 1:03 PM   CHIEF COMPLAINTS/PURPOSE OF CONSULTATION:  "My leg is much better"  HISTORY OF PRESENTING ILLNESS:  08/24/2019. 78 y.o. male  is here because of recently diagnosed DVT.  He presented to [facility] ED in early 08/2019 for three days of progressive right lower extremity swelling, and doppler showed an acute DVT involving the R peroneal, posterior tibial, and popliteal veins.  He was started on Eliquis and discharged from the ED.  Patient was  referred to hematology for further evaluation and management.  Patient reports that he had a history of right lower DVT approximately 15 years ago, for which he was treated with warfarin for several months.  He has chronic venous insufficiency and varicose veins, for which he wears compression stockings to help with the swelling.  Since starting Eliquis after the recent ER visit, the right lower extremity swelling has resolved.  He denies any constitutional symptoms, chest pain, dyspnea, abdominal pain, nausea, vomiting, diarrhea, or abnormal bleeding/bruising.  He denies any history of smoking.  He only drinks occasionally.  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  He denies any personal history of cancer.  He is up-to-date with colonoscopy, most recently in 2018 at [facility].  REVIEW OF SYSTEMS:   Constitutional: ( - ) fevers, ( - )  chills , ( - ) night sweats Eyes: ( - ) blurriness of vision, ( - ) double vision, ( - ) watery eyes Ears, nose, mouth, throat, and face: ( - ) mucositis, ( - ) sore throat Respiratory: ( - ) cough, ( - ) dyspnea, ( - ) wheezes Cardiovascular: ( - ) palpitation, ( - ) chest discomfort, ( + ) lower extremity swelling Gastrointestinal:  ( - ) nausea, ( - ) heartburn, ( - ) change in bowel habits Skin: ( - ) abnormal skin rashes Lymphatics: ( - ) new lymphadenopathy, ( - ) easy bruising Neurological: ( - ) numbness, ( - ) tingling, ( - ) new weaknesses Behavioral/Psych: ( - ) mood change, ( - ) new changes  All other systems were reviewed with the patient and are negative.  I have reviewed his chart and materials related to his cancer extensively and collaborated history with the patient. Summary of oncologic history is as follows: Oncology History   No history exists.    MEDICAL HISTORY:  Past Medical History:  Diagnosis Date  . Actinic keratosis    hyperplastic aktinic keratosis forehead s/p surgery Dr Park Liter  . BPH (benign prostatic hyperplasia)     s/p TUNA  . Chronic cystitis    Dr. Liliane Shi  . DVT (deep venous thrombosis) (HCC) 2005   no know triggers per pt  . HTN (hypertension)    dx in the 70s  . OSA (obstructive sleep apnea)    on CPAP  . Recurrent UTI   . Umbilical hernia    no surgery  . Vitamin D deficiency     SURGICAL HISTORY: Past Surgical History:  Procedure Laterality Date  . PROSTATE SURGERY  2003   TUNA around 2003    SOCIAL HISTORY: Social History   Socioeconomic History  . Marital status: Married    Spouse name: Not on file  . Number of children: 4  . Years of education: Not on file  . Highest education level: Not on file  Occupational History  . 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 Smoking status: Never Smoker  . Smokeless tobacco: Never Used  Substance and Sexual Activity  . Alcohol use: Yes    Alcohol/week: 0.0 standard drinks    Comment: 2x per  week  . Drug use: No  . Sexual activity: Not Currently  Other Topics Concern  . Not on file  Social History Narrative   Lives w/ wife         Social Determinants of Health   Financial Resource Strain:   . Difficulty of Paying Living Expenses: Not on file  Food Insecurity:   . Worried About Programme researcher, broadcasting/film/videounning Out of Food in the Last Year: Not on file  . Ran Out of Food in the Last Year: Not on file  Transportation Needs:   . Lack of Transportation (Medical): Not on file  . Lack of Transportation (Non-Medical): Not on file  Physical Activity:   . Days of Exercise per Week: Not on file  . Minutes of Exercise per Session: Not on file  Stress:   . Feeling of Stress : Not on file  Social Connections:   . Frequency of Communication with Friends and Family: Not on file  . Frequency of Social Gatherings with Friends and Family: Not on file  . Attends Religious Services: Not on file  . Active Member of Clubs or Organizations: Not on file  . Attends BankerClub or Organization  Meetings: Not on file  . Marital Status: Not on file  Intimate Partner Violence:   . Fear of Current or Ex-Partner: Not on file  . Emotionally Abused: Not on file  . Physically Abused: Not on file  . Sexually Abused: Not on file    FAMILY HISTORY: Family History  Problem Relation Age of Onset  . Diabetes Mother        Judie PetitM, sister  . Heart attack Father [id number]  . Breast cancer Sister   . Rectal cancer Sister [phone]       @ time of her first Cscope  . Lung cancer Brother [phone]       smoker   . Prostate cancer Neg Hx     ALLERGIES:  is allergic to hydrocodone; oxycodone; and latex.  MEDICATIONS:  Current Outpatient Medications  Medication Sig Dispense Refill  . acetaminophen (TYLENOL) 500 MG tablet Take 500 mg by mouth as needed for mild pain or headache.     Marland Kitchen. apixaban (ELIQUIS) 5 MG TABS tablet Take 1 tablet (5 mg total) by mouth 2 (two) times daily. 180 tablet 1  . bisoprolol-hydrochlorothiazide (ZIAC) 5-6.25 MG tablet Take 1 tablet by mouth daily. Okay to take a second tablet if BP more than 140/85 180 tablet 2  . Cholecalciferol (VITAMIN D3) 2000 units TABS Take 2,000 Units by mouth daily.     . diclofenac Sodium (VOLTAREN) 1 % GEL Apply 2 g topically 4 (four) times daily. 100 g 1  . DM-GG & DM-APAP-CPM (CORICIDIN HBP DAY/NIGHT COLD) 10-20 &15-200-2 MG MISC Take 1 tablet by mouth daily as needed (allergies).    Marland Kitchen. doxazosin (CARDURA) 4 MG tablet Take 1 tablet (4 mg total) by mouth at bedtime. 90 tablet 1  . Flaxseed Oil OIL Take 1,400 mg by mouth daily.     . fluticasone (FLONASE) 50 MCG/ACT nasal spray Place 2 sprays into both nostrils daily as needed for allergies or rhinitis. 48 g 3  . OVER THE COUNTER MEDICATION Prostate Plus Health Complex w/ Cran-Max-Cranberry 1 daily     No current facility-administered medications for this visit.    PHYSICAL EXAMINATION: ECOG PERFORMANCE STATUS: 1 - Symptomatic but completely ambulatory  Vitals:   08/22/19 1229  BP: 135/74  Pulse: (!) 56   Resp: 19  Temp: (!) 96.9  F (36.1 C)  SpO2: 99%   Filed Weights   08/22/19 1229  Weight: 205 lb 12.8 oz (93.4 kg)    GENERAL: alert, no distress and comfortable SKIN: skin color, texture, turgor are normal, no rashes or significant lesions EYES: conjunctiva are pink and non-injected, sclera clear OROPHARYNX: no exudate, no erythema; lips, buccal mucosa, and tongue normal  NECK: supple, non-tender LUNGS: clear to auscultation with normal breathing effort HEART: regular rate & rhythm, no murmurs, no lower extremity edema ABDOMEN: soft, non-tender, non-distended, normal bowel sounds Musculoskeletal: no cyanosis of digits and no clubbing  PSYCH: alert & oriented x 3, fluent speech  LABORATORY DATA:  I have reviewed the data as listed Lab Results  Component Value Date   WBC 7.1 08/22/2019   HGB 14.9 08/22/2019   HCT 44.6 08/22/2019   MCV 87.3 08/22/2019   PLT 218 08/22/2019   Lab Results  Component Value Date   NA 138 08/22/2019   K 4.2 08/22/2019   CL 104 08/22/2019   CO2 26 08/22/2019    RADIOGRAPHIC STUDIES: I have personally reviewed the radiological images as listed and agreed with the findings in the report. VAS [place] LOWER EXTREMITY VENOUS (DVT) (ONLY [facility] & [facility] 7a-7p)  Result Date: 08/11/2019  Lower Venous Study Indications: Swelling, and Pain.  Comparison Study: no prior Performing Technologist: Blanch Media RVS  Examination Guidelines: A complete evaluation includes B-mode imaging, spectral Doppler, color Doppler, and power Doppler as needed of all accessible portions of each vessel. Bilateral testing is considered an integral part of a complete examination. Limited examinations for reoccurring indications may be performed as noted.  +---------+---------------+---------+-----------+----------+--------------+ RIGHT    CompressibilityPhasicitySpontaneityPropertiesThrombus Aging +---------+---------------+---------+-----------+----------+--------------+ CFV      Full            Yes      Yes                                 +---------+---------------+---------+-----------+----------+--------------+ SFJ      Full                                                        +---------+---------------+---------+-----------+----------+--------------+ FV Prox  Full                                                        +---------+---------------+---------+-----------+----------+--------------+ FV Mid   Full                                                        +---------+---------------+---------+-----------+----------+--------------+ FV DistalFull                                                        +---------+---------------+---------+-----------+----------+--------------+ PFV      Full                                                        +---------+---------------+---------+-----------+----------+--------------+  POP      None           No       No                   Acute          +---------+---------------+---------+-----------+----------+--------------+ PTV      None                                         Acute          +---------+---------------+---------+-----------+----------+--------------+ PERO     None                                         Acute          +---------+---------------+---------+-----------+----------+--------------+   +----+---------------+---------+-----------+----------+--------------+ LEFTCompressibilityPhasicitySpontaneityPropertiesThrombus Aging +----+---------------+---------+-----------+----------+--------------+ CFV Full           Yes      Yes                                 +----+---------------+---------+-----------+----------+--------------+     Summary: Right: Findings consistent with acute deep vein thrombosis involving the right peroneal veins, right posterior tibial veins, and right popliteal vein. No cystic structure found in the popliteal fossa. Left: No evidence of  common femoral vein obstruction.  *See table(s) above for measurements and observations. Electronically signed by Deitra Mayo MD on 08/11/2019 at 7:23:53 PM.    Final     PATHOLOGY: I have reviewed the pathology reports as documented in the oncologist history.

## 2019-08-21 ENCOUNTER — Other Ambulatory Visit: Payer: Self-pay | Admitting: Hematology

## 2019-08-21 DIAGNOSIS — I82409 Acute embolism and thrombosis of unspecified deep veins of unspecified lower extremity: Secondary | ICD-10-CM | POA: Insufficient documentation

## 2019-08-21 DIAGNOSIS — I824Z1 Acute embolism and thrombosis of unspecified deep veins of right distal lower extremity: Secondary | ICD-10-CM

## 2019-08-22 ENCOUNTER — Encounter: Payer: Self-pay | Admitting: Hematology

## 2019-08-22 ENCOUNTER — Inpatient Hospital Stay: Payer: Medicare Other

## 2019-08-22 ENCOUNTER — Telehealth: Payer: Self-pay | Admitting: Hematology

## 2019-08-22 ENCOUNTER — Inpatient Hospital Stay: Payer: Medicare Other | Attending: Hematology | Admitting: Hematology

## 2019-08-22 ENCOUNTER — Other Ambulatory Visit: Payer: Self-pay

## 2019-08-22 VITALS — BP 135/74 | HR 56 | Temp 96.9°F | Resp 19 | Ht 67.0 in | Wt 205.8 lb

## 2019-08-22 DIAGNOSIS — I872 Venous insufficiency (chronic) (peripheral): Secondary | ICD-10-CM

## 2019-08-22 DIAGNOSIS — Z86718 Personal history of other venous thrombosis and embolism: Secondary | ICD-10-CM | POA: Diagnosis not present

## 2019-08-22 DIAGNOSIS — I82441 Acute embolism and thrombosis of right tibial vein: Secondary | ICD-10-CM | POA: Diagnosis not present

## 2019-08-22 DIAGNOSIS — I8390 Asymptomatic varicose veins of unspecified lower extremity: Secondary | ICD-10-CM | POA: Diagnosis not present

## 2019-08-22 DIAGNOSIS — Z7901 Long term (current) use of anticoagulants: Secondary | ICD-10-CM | POA: Diagnosis not present

## 2019-08-22 DIAGNOSIS — I82431 Acute embolism and thrombosis of right popliteal vein: Secondary | ICD-10-CM | POA: Diagnosis not present

## 2019-08-22 DIAGNOSIS — Z801 Family history of malignant neoplasm of trachea, bronchus and lung: Secondary | ICD-10-CM | POA: Diagnosis not present

## 2019-08-22 DIAGNOSIS — I82451 Acute embolism and thrombosis of right peroneal vein: Secondary | ICD-10-CM | POA: Insufficient documentation

## 2019-08-22 DIAGNOSIS — Z803 Family history of malignant neoplasm of breast: Secondary | ICD-10-CM | POA: Insufficient documentation

## 2019-08-22 DIAGNOSIS — I824Z1 Acute embolism and thrombosis of unspecified deep veins of right distal lower extremity: Secondary | ICD-10-CM

## 2019-08-22 DIAGNOSIS — N183 Chronic kidney disease, stage 3 unspecified: Secondary | ICD-10-CM | POA: Diagnosis not present

## 2019-08-22 LAB — CMP (CANCER CENTER ONLY)
ALT: 14 U/L (ref 0–44)
AST: 14 U/L — ABNORMAL LOW (ref 15–41)
Albumin: 4.7 g/dL (ref 3.5–5.0)
Alkaline Phosphatase: 53 U/L (ref 38–126)
Anion gap: 8 (ref 5–15)
BUN: 27 mg/dL — ABNORMAL HIGH (ref 8–23)
CO2: 26 mmol/L (ref 22–32)
Calcium: 9.3 mg/dL (ref 8.9–10.3)
Chloride: 104 mmol/L (ref 98–111)
Creatinine: 1.37 mg/dL — ABNORMAL HIGH (ref 0.61–1.24)
GFR, Est AFR Am: 56 mL/min — ABNORMAL LOW (ref >60)
GFR, Estimated: 49 mL/min — ABNORMAL LOW (ref >60)
Glucose, Bld: 126 mg/dL — ABNORMAL HIGH (ref 70–99)
Potassium: 4.2 mmol/L (ref 3.5–5.1)
Sodium: 138 mmol/L (ref 135–145)
Total Bilirubin: 0.8 mg/dL (ref 0.3–1.2)
Total Protein: 7.5 g/dL (ref 6.5–8.1)

## 2019-08-22 LAB — CBC WITH DIFFERENTIAL (CANCER CENTER ONLY)
Abs Immature Granulocytes: 0.01 10*3/uL (ref 0.00–0.07)
Basophils Absolute: 0 10*3/uL (ref 0.0–0.1)
Basophils Relative: 0 %
Eosinophils Absolute: 0 10*3/uL (ref 0.0–0.5)
Eosinophils Relative: 1 %
HCT: 44.6 % (ref 39.0–52.0)
Hemoglobin: 14.9 g/dL (ref 13.0–17.0)
Immature Granulocytes: 0 %
Lymphocytes Relative: 32 %
Lymphs Abs: 2.3 10*3/uL (ref 0.7–4.0)
MCH: 29.2 pg (ref 26.0–34.0)
MCHC: 33.4 g/dL (ref 30.0–36.0)
MCV: 87.3 fL (ref 80.0–100.0)
Monocytes Absolute: 0.4 10*3/uL (ref 0.1–1.0)
Monocytes Relative: 6 %
Neutro Abs: 4.3 10*3/uL (ref 1.7–7.7)
Neutrophils Relative %: 61 %
Platelet Count: 218 10*3/uL (ref 150–400)
RBC: 5.11 MIL/uL (ref 4.22–5.81)
RDW: 12.9 % (ref 11.5–15.5)
WBC Count: 7.1 10*3/uL (ref 4.0–10.5)
nRBC: 0 % (ref 0.0–0.2)

## 2019-08-22 LAB — D-DIMER, QUANTITATIVE: D-Dimer, Quant: 1.21 ug/mL-FEU — ABNORMAL HIGH (ref 0.00–0.50)

## 2019-08-22 MED ORDER — APIXABAN 5 MG PO TABS: 5.0000 mg | ORAL_TABLET | Freq: Two times a day (BID) | ORAL | 1 refills | Status: DC

## 2019-08-22 NOTE — Telephone Encounter (Signed)
Called and advised patient of appointments added per 12/15 los

## 2019-09-15 DIAGNOSIS — G4733 Obstructive sleep apnea (adult) (pediatric): Secondary | ICD-10-CM | POA: Diagnosis not present

## 2019-09-22 ENCOUNTER — Ambulatory Visit: Payer: Medicare Other | Attending: Internal Medicine

## 2019-09-22 DIAGNOSIS — Z23 Encounter for immunization: Secondary | ICD-10-CM | POA: Insufficient documentation

## 2019-09-22 NOTE — Progress Notes (Signed)
   [facility]  Name:  Gasper Hopes.    MRN: [id number] DOB: 09/23/1940  09/22/2019  Mr. Laymon was observed post Covid-19 immunization for 15 minutes without incidence. He was provided with Vaccine Information Sheet and instruction to access the V-Safe system.   Mr. Odonohue was instructed to call 911 with any severe reactions post vaccine: Marland Kitchen Difficulty breathing  . Swelling of your face and throat  . A fast heartbeat  . A bad rash all over your body  . Dizziness and weakness    Immunizations Administered    Name Date Dose VIS Date Route   Pfizer COVID-19 Vaccine 09/22/2019 12:44 PM 0.3 mL 08/18/2019 Intramuscular   Manufacturer: [employer], [employer]   Lot: [id number]   NDC: [address]-1

## 2019-10-13 ENCOUNTER — Ambulatory Visit: Payer: Medicare Other | Attending: Internal Medicine

## 2019-10-13 DIAGNOSIS — Z23 Encounter for immunization: Secondary | ICD-10-CM | POA: Insufficient documentation

## 2019-10-13 NOTE — Progress Notes (Signed)
   [facility]  Name:  Jatinder Mcdonagh.    MRN: [id number] DOB: March 02, 1941  10/13/2019  Mr. Jeff was observed post Covid-19 immunization for 15 minutes without incidence. He was provided with Vaccine Information Sheet and instruction to access the V-Safe system.   Mr. Mcleary was instructed to call 911 with any severe reactions post vaccine: Marland Kitchen Difficulty breathing  . Swelling of your face and throat  . A fast heartbeat  . A bad rash all over your body  . Dizziness and weakness    Immunizations Administered    Name Date Dose VIS Date Route   Pfizer COVID-19 Vaccine 10/13/2019  1:22 PM 0.3 mL 08/18/2019 Intramuscular   Manufacturer: [employer], [employer]   Lot: [id number]   NDC: [address]-5

## 2019-11-20 ENCOUNTER — Other Ambulatory Visit: Payer: Self-pay

## 2019-11-21 ENCOUNTER — Ambulatory Visit (HOSPITAL_BASED_OUTPATIENT_CLINIC_OR_DEPARTMENT_OTHER)
Admission: RE | Admit: 2019-11-21 | Discharge: 2019-11-21 | Disposition: A | Discharge status: Home / Self Care | Payer: Medicare Other | Source: Ambulatory Visit | Attending: Internal Medicine | Admitting: Internal Medicine

## 2019-11-21 ENCOUNTER — Ambulatory Visit (INDEPENDENT_AMBULATORY_CARE_PROVIDER_SITE_OTHER): Payer: Medicare Other | Admitting: Internal Medicine

## 2019-11-21 ENCOUNTER — Ambulatory Visit: Payer: Medicare Other | Admitting: Internal Medicine

## 2019-11-21 ENCOUNTER — Other Ambulatory Visit: Payer: Self-pay

## 2019-11-21 ENCOUNTER — Encounter: Payer: Self-pay | Admitting: Internal Medicine

## 2019-11-21 VITALS — BP 136/71 | HR 50 | Temp 95.9°F | Resp 18 | Ht 67.0 in | Wt 205.1 lb

## 2019-11-21 DIAGNOSIS — R739 Hyperglycemia, unspecified: Secondary | ICD-10-CM | POA: Diagnosis not present

## 2019-11-21 DIAGNOSIS — Z86718 Personal history of other venous thrombosis and embolism: Secondary | ICD-10-CM

## 2019-11-21 DIAGNOSIS — M25551 Pain in right hip: Secondary | ICD-10-CM | POA: Diagnosis not present

## 2019-11-21 DIAGNOSIS — M549 Dorsalgia, unspecified: Secondary | ICD-10-CM

## 2019-11-21 DIAGNOSIS — M545 Low back pain: Secondary | ICD-10-CM | POA: Diagnosis not present

## 2019-11-21 LAB — HEMOGLOBIN A1C: Hgb A1c MFr Bld: 6.2 % (ref 4.6–6.5)

## 2019-11-21 NOTE — Patient Instructions (Signed)
GO TO THE LAB : Get the blood work     GO TO THE FRONT DESK Come back for a physical exam in 6 months, please make an appointment     STOP BY THE FIRST FLOOR:  get the XR

## 2019-11-21 NOTE — Progress Notes (Signed)
Subjective:    Patient ID: Timothy Garter., male    DOB: February 22, 1941, 79 y.o.   MRN: [id number]  DOS:  11/21/2019 Type of visit - description: Follow-up Several issues discussed History of DVT, oncology note reviewed HTN: Good med compliance. Pain: Several months history of low back pain, typically starts as soon as he wakes up, as he continue with his morning and moves the pain decreases.  No radiation. Also some pain at the right buttock, near the hip. Tylenol offers little help.   Review of Systems Denies chest pain, difficulty breathing.  No edema No pain radiation to the lower extremity, no lower extremity paresthesias No blood in the stools or in the urine  Past Medical History:  Diagnosis Date  . Actinic keratosis    hyperplastic aktinic keratosis forehead s/p surgery Dr Link Snuffer  . BPH (benign prostatic hyperplasia)    s/p TUNA  . Chronic cystitis    Dr. Lovena Neighbours  . DVT (deep venous thrombosis) ([place]) 2005   no know triggers per pt  . HTN (hypertension)    dx in the 70s  . OSA (obstructive sleep apnea)    on CPAP  . Recurrent UTI   . Umbilical hernia    no surgery  . Vitamin D deficiency     Past Surgical History:  Procedure Laterality Date  . PROSTATE SURGERY  2003   TUNA around 2003    Allergies as of 11/21/2019      Reactions   Hydrocodone Shortness Of Breath   Oxycodone    Latex Hives      Medication List       Accurate as of November 21, 2019 11:59 PM. If you have any questions, ask your nurse or doctor.        acetaminophen 500 MG tablet Commonly known as: TYLENOL Take 500 mg by mouth as needed for mild pain or headache.   apixaban 5 MG Tabs tablet Commonly known as: Eliquis Take 1 tablet (5 mg total) by mouth 2 (two) times daily.   bisoprolol-hydrochlorothiazide 5-6.25 MG tablet Commonly known as: ZIAC Take 1 tablet by mouth daily. Okay to take a second tablet if BP more than 140/85   Coricidin HBP Day/Night Cold 10-20 &15-200-2 MG  Misc Generic drug: DM-GG & DM-APAP-CPM Take 1 tablet by mouth daily as needed (allergies).   diclofenac Sodium 1 % Gel Commonly known as: VOLTAREN Apply 2 g topically 4 (four) times daily.   doxazosin 4 MG tablet Commonly known as: CARDURA Take 1 tablet (4 mg total) by mouth at bedtime.   Flaxseed Oil Oil Take 1,400 mg by mouth daily.   fluticasone 50 MCG/ACT nasal spray Commonly known as: FLONASE Place 2 sprays into both nostrils daily as needed for allergies or rhinitis.   OVER THE COUNTER MEDICATION Prostate Plus Health Complex w/ Cran-Max-Cranberry 1 daily   Vitamin D3 50 MCG (2000 UT) Tabs Take 2,000 Units by mouth daily.          Objective:   Physical Exam BP 136/71 (BP Location: Left Arm, Patient Position: Sitting, Cuff Size: Normal)   Pulse (!) 50   Temp (!) 95.9 F (35.5 C) (Temporal)   Resp 18   Ht 5\' 7"  (1.702 m)   Wt 205 lb 2 oz (93 kg)   SpO2 98%   BMI 32.13 kg/m   General:   Well developed, NAD, BMI noted. HEENT:  Normocephalic . Face symmetric, atraumatic Lungs:  CTA B Normal respiratory effort, no  intercostal retractions, no accessory muscle use. Heart: RRR,  no murmur.  MSK: No TTP at the lumbosacral spine or SI's Hips: Normal rotation, no TTP at the trochanteric bursa's Skin: Not pale. Not jaundice Neurologic:  alert & oriented X3.  Speech normal, gait appropriate for age and unassisted.  Motor and DTRs symmetric Psych--  Cognition and judgment appear intact.  Cooperative with normal attention span and concentration.  Behavior appropriate. No anxious or depressed appearing.      Assessment     Assessment: Prediabetes HTN Bigeminy: Saw cardiology 2019, Holter: 80% QRS with PVCs.  Echo was done, see report BPH, recurrent UTIs, chronic cystitis OSA, on CPAP Umbilical hernia Actinic keratosis; no derm eval recently as off 02/2018 Tinnitus  H/o vitamin D deficiency H/o R leg  DVT, 2005 and 2020, unprovoked   PLAN: History of  DVT: Saw hematology 08/22/2019, note reviewed, recommended lifelong therapy with anticoagulants, at some point consider decrease dose of Eliquis to 2.5 milligrams twice a day.  Has a follow-up scheduled with hematology in few weeks. ERX:[id number], continue Ziac. Prediabetes: Check A1c Back pain, right hip pain: As described above, likely DJD, he is already active and goes to the [facility].  I think physical therapy stretching should help.  Referral to PT. check on lumbar spine x-ray. Preventive care: Had a Covid shot x2 RTC CPX 05-2020   This visit occurred during the SARS-CoV-2 public health emergency.  Safety protocols were in place, including screening questions prior to the visit, additional usage of staff PPE, and extensive cleaning of exam room while observing appropriate contact time as indicated for disinfecting solutions.

## 2019-11-21 NOTE — Progress Notes (Signed)
Pre visit review using our clinic review tool, if applicable. No additional management support is needed unless otherwise documented below in the visit note. 

## 2019-11-22 NOTE — Assessment & Plan Note (Signed)
History of DVT: Saw hematology 08/22/2019, note reviewed, recommended lifelong therapy with anticoagulants, at some point consider decrease dose of Eliquis to 2.5 milligrams twice a day.  Has a follow-up scheduled with hematology in few weeks. VUD:[id number], continue Ziac. Prediabetes: Check A1c Back pain, right hip pain: As described above, likely DJD, he is already active and goes to the [facility].  I think physical therapy stretching should help.  Referral to PT. check on lumbar spine x-ray. Preventive care: Had a Covid shot x2 RTC CPX 05-2020

## 2019-11-28 DIAGNOSIS — M545 Low back pain: Secondary | ICD-10-CM | POA: Diagnosis not present

## 2019-11-28 DIAGNOSIS — R293 Abnormal posture: Secondary | ICD-10-CM | POA: Diagnosis not present

## 2019-11-28 DIAGNOSIS — M25551 Pain in right hip: Secondary | ICD-10-CM | POA: Diagnosis not present

## 2019-11-28 DIAGNOSIS — M2569 Stiffness of other specified joint, not elsewhere classified: Secondary | ICD-10-CM | POA: Diagnosis not present

## 2019-11-29 ENCOUNTER — Telehealth: Payer: Self-pay

## 2019-11-29 DIAGNOSIS — M25551 Pain in right hip: Secondary | ICD-10-CM | POA: Diagnosis not present

## 2019-11-29 DIAGNOSIS — M2569 Stiffness of other specified joint, not elsewhere classified: Secondary | ICD-10-CM | POA: Diagnosis not present

## 2019-11-29 DIAGNOSIS — R293 Abnormal posture: Secondary | ICD-10-CM | POA: Diagnosis not present

## 2019-11-29 DIAGNOSIS — M545 Low back pain: Secondary | ICD-10-CM | POA: Diagnosis not present

## 2019-11-29 NOTE — Telephone Encounter (Signed)
Plan of care signed and faxed back to [facility]. Form sent for scanning.

## 2019-12-04 DIAGNOSIS — R293 Abnormal posture: Secondary | ICD-10-CM | POA: Diagnosis not present

## 2019-12-04 DIAGNOSIS — M25551 Pain in right hip: Secondary | ICD-10-CM | POA: Diagnosis not present

## 2019-12-04 DIAGNOSIS — M545 Low back pain: Secondary | ICD-10-CM | POA: Diagnosis not present

## 2019-12-04 DIAGNOSIS — M2569 Stiffness of other specified joint, not elsewhere classified: Secondary | ICD-10-CM | POA: Diagnosis not present

## 2019-12-06 DIAGNOSIS — M2569 Stiffness of other specified joint, not elsewhere classified: Secondary | ICD-10-CM | POA: Diagnosis not present

## 2019-12-06 DIAGNOSIS — M545 Low back pain: Secondary | ICD-10-CM | POA: Diagnosis not present

## 2019-12-06 DIAGNOSIS — R293 Abnormal posture: Secondary | ICD-10-CM | POA: Diagnosis not present

## 2019-12-06 DIAGNOSIS — M25551 Pain in right hip: Secondary | ICD-10-CM | POA: Diagnosis not present

## 2019-12-11 DIAGNOSIS — M545 Low back pain: Secondary | ICD-10-CM | POA: Diagnosis not present

## 2019-12-11 DIAGNOSIS — R293 Abnormal posture: Secondary | ICD-10-CM | POA: Diagnosis not present

## 2019-12-11 DIAGNOSIS — M2569 Stiffness of other specified joint, not elsewhere classified: Secondary | ICD-10-CM | POA: Diagnosis not present

## 2019-12-11 DIAGNOSIS — M25551 Pain in right hip: Secondary | ICD-10-CM | POA: Diagnosis not present

## 2019-12-13 DIAGNOSIS — M545 Low back pain: Secondary | ICD-10-CM | POA: Diagnosis not present

## 2019-12-13 DIAGNOSIS — M2569 Stiffness of other specified joint, not elsewhere classified: Secondary | ICD-10-CM | POA: Diagnosis not present

## 2019-12-13 DIAGNOSIS — R293 Abnormal posture: Secondary | ICD-10-CM | POA: Diagnosis not present

## 2019-12-13 DIAGNOSIS — M25551 Pain in right hip: Secondary | ICD-10-CM | POA: Diagnosis not present

## 2019-12-18 DIAGNOSIS — R293 Abnormal posture: Secondary | ICD-10-CM | POA: Diagnosis not present

## 2019-12-18 DIAGNOSIS — M25551 Pain in right hip: Secondary | ICD-10-CM | POA: Diagnosis not present

## 2019-12-18 DIAGNOSIS — M2569 Stiffness of other specified joint, not elsewhere classified: Secondary | ICD-10-CM | POA: Diagnosis not present

## 2019-12-18 DIAGNOSIS — M545 Low back pain: Secondary | ICD-10-CM | POA: Diagnosis not present

## 2019-12-20 DIAGNOSIS — R293 Abnormal posture: Secondary | ICD-10-CM | POA: Diagnosis not present

## 2019-12-20 DIAGNOSIS — M25551 Pain in right hip: Secondary | ICD-10-CM | POA: Diagnosis not present

## 2019-12-20 DIAGNOSIS — M2569 Stiffness of other specified joint, not elsewhere classified: Secondary | ICD-10-CM | POA: Diagnosis not present

## 2019-12-20 DIAGNOSIS — M545 Low back pain: Secondary | ICD-10-CM | POA: Diagnosis not present

## 2019-12-27 DIAGNOSIS — M545 Low back pain: Secondary | ICD-10-CM | POA: Diagnosis not present

## 2019-12-27 DIAGNOSIS — R293 Abnormal posture: Secondary | ICD-10-CM | POA: Diagnosis not present

## 2019-12-27 DIAGNOSIS — M25551 Pain in right hip: Secondary | ICD-10-CM | POA: Diagnosis not present

## 2019-12-27 DIAGNOSIS — M2569 Stiffness of other specified joint, not elsewhere classified: Secondary | ICD-10-CM | POA: Diagnosis not present

## 2020-01-01 DIAGNOSIS — R3914 Feeling of incomplete bladder emptying: Secondary | ICD-10-CM | POA: Diagnosis not present

## 2020-01-03 DIAGNOSIS — M2569 Stiffness of other specified joint, not elsewhere classified: Secondary | ICD-10-CM | POA: Diagnosis not present

## 2020-01-03 DIAGNOSIS — M545 Low back pain: Secondary | ICD-10-CM | POA: Diagnosis not present

## 2020-01-03 DIAGNOSIS — M25551 Pain in right hip: Secondary | ICD-10-CM | POA: Diagnosis not present

## 2020-01-03 DIAGNOSIS — R293 Abnormal posture: Secondary | ICD-10-CM | POA: Diagnosis not present

## 2020-01-19 ENCOUNTER — Other Ambulatory Visit: Payer: Self-pay

## 2020-01-19 ENCOUNTER — Inpatient Hospital Stay: Payer: Medicare Other | Attending: Hematology

## 2020-01-19 ENCOUNTER — Inpatient Hospital Stay (HOSPITAL_BASED_OUTPATIENT_CLINIC_OR_DEPARTMENT_OTHER): Payer: Medicare Other | Admitting: Hematology

## 2020-01-19 ENCOUNTER — Encounter: Payer: Self-pay | Admitting: Hematology

## 2020-01-19 VITALS — BP 142/86 | HR 51 | Temp 96.9°F | Resp 16 | Wt 205.0 lb

## 2020-01-19 DIAGNOSIS — I824Z1 Acute embolism and thrombosis of unspecified deep veins of right distal lower extremity: Secondary | ICD-10-CM

## 2020-01-19 DIAGNOSIS — I82451 Acute embolism and thrombosis of right peroneal vein: Secondary | ICD-10-CM | POA: Insufficient documentation

## 2020-01-19 DIAGNOSIS — I82431 Acute embolism and thrombosis of right popliteal vein: Secondary | ICD-10-CM | POA: Insufficient documentation

## 2020-01-19 DIAGNOSIS — Z7901 Long term (current) use of anticoagulants: Secondary | ICD-10-CM | POA: Diagnosis not present

## 2020-01-19 DIAGNOSIS — I82441 Acute embolism and thrombosis of right tibial vein: Secondary | ICD-10-CM | POA: Diagnosis not present

## 2020-01-19 DIAGNOSIS — N183 Chronic kidney disease, stage 3 unspecified: Secondary | ICD-10-CM

## 2020-01-19 DIAGNOSIS — Z86718 Personal history of other venous thrombosis and embolism: Secondary | ICD-10-CM | POA: Insufficient documentation

## 2020-01-19 LAB — CMP (CANCER CENTER ONLY)
ALT: 15 U/L (ref 0–44)
AST: 16 U/L (ref 15–41)
Albumin: 4.4 g/dL (ref 3.5–5.0)
Alkaline Phosphatase: 56 U/L (ref 38–126)
Anion gap: 7 (ref 5–15)
BUN: 24 mg/dL — ABNORMAL HIGH (ref 8–23)
CO2: 29 mmol/L (ref 22–32)
Calcium: 10 mg/dL (ref 8.9–10.3)
Chloride: 103 mmol/L (ref 98–111)
Creatinine: 1.25 mg/dL — ABNORMAL HIGH (ref 0.61–1.24)
GFR, Est AFR Am: >60 mL/min (ref >60)
GFR, Estimated: 54 mL/min — ABNORMAL LOW (ref >60)
Glucose, Bld: 132 mg/dL — ABNORMAL HIGH (ref 70–99)
Potassium: 4.8 mmol/L (ref 3.5–5.1)
Sodium: 139 mmol/L (ref 135–145)
Total Bilirubin: 0.6 mg/dL (ref 0.3–1.2)
Total Protein: 7.1 g/dL (ref 6.5–8.1)

## 2020-01-19 LAB — CBC WITH DIFFERENTIAL (CANCER CENTER ONLY)
Abs Immature Granulocytes: 0.02 10*3/uL (ref 0.00–0.07)
Basophils Absolute: 0 10*3/uL (ref 0.0–0.1)
Basophils Relative: 1 %
Eosinophils Absolute: 0 10*3/uL (ref 0.0–0.5)
Eosinophils Relative: 1 %
HCT: 44 % (ref 39.0–52.0)
Hemoglobin: 14.9 g/dL (ref 13.0–17.0)
Immature Granulocytes: 0 %
Lymphocytes Relative: 37 %
Lymphs Abs: 2.4 10*3/uL (ref 0.7–4.0)
MCH: 29.7 pg (ref 26.0–34.0)
MCHC: 33.9 g/dL (ref 30.0–36.0)
MCV: 87.6 fL (ref 80.0–100.0)
Monocytes Absolute: 0.4 10*3/uL (ref 0.1–1.0)
Monocytes Relative: 6 %
Neutro Abs: 3.6 10*3/uL (ref 1.7–7.7)
Neutrophils Relative %: 55 %
Platelet Count: 198 10*3/uL (ref 150–400)
RBC: 5.02 MIL/uL (ref 4.22–5.81)
RDW: 13.3 % (ref 11.5–15.5)
WBC Count: 6.5 10*3/uL (ref 4.0–10.5)
nRBC: 0 % (ref 0.0–0.2)

## 2020-01-19 LAB — D-DIMER, QUANTITATIVE: D-Dimer, Quant: 0.52 ug/mL-FEU — ABNORMAL HIGH (ref 0.00–0.50)

## 2020-01-19 MED ORDER — APIXABAN 2.5 MG PO TABS: 2.5000 mg | ORAL_TABLET | Freq: Two times a day (BID) | ORAL | 3 refills | Status: DC

## 2020-01-19 NOTE — Progress Notes (Signed)
[facility] OFFICE PROGRESS NOTE  Patient Care Team: Wanda Plump, MD as PCP - General Liliane Shi, Dorian Furnace, MD as Consulting Physician (Urology) Axel Filler, MD as Consulting Physician (General Surgery) Blima Ledger, OD as Consulting Physician (Optometry) Christell Constant Rowe Robert., MD as Consulting Physician (Otolaryngology)  HEME/ONC OVERVIEW: 1. Acute RLE DVT, unprovoked -Remote hx of DVT in early 2000's; superficial venous thromboses in the RLE in 2015, no DVT  -08/2019: acute DVT involving R peroneal, posterior tibial, and popliteal veins  On Eliquis 5mg  BID; plan to transition to Eliquis 2.5mg  BID in late 02/2020   ASSESSMENT & PLAN:   Acute RLE DVT, unprovoked -Currently tolerating Eliquis 5mg  BID well without any abnormal bleeding or excess bruising -Due to the history of DVT in 2000's and the unprovoked nature of recent RLE DVT, the goal of anticoagulation would be lifelong -However, as the clot burden from the recent DVT was relatively small, there is no compelling reason to continue the full-dose anticoagulation indefinitely -As he has completed at least 6 months of full-dose anticoagulation, and does not have any evidence of recurrent DVT, I have reduced Eliquis to 2.5mg  BID for secondary ppx, which would help reduce the risk of future VTE and lower the risk of bleeding from anticoagulation -I encouraged the patient to continue elastic compression stockings to reduce risks of chronic thrombophlebitis. -I also reinforced the importance of preventive strategies such as avoiding hormonal supplement, avoiding cigarette smoking, keeping up-to-date with screening programs for early cancer detection, frequent ambulation for long distance travel and aggressive DVT prophylaxis in all surgical settings. -Should he need any interruption of the anticoagulation for elective procedures in the future, feel free to contact me regarding peri-operative management.  Stage III  CKD -Cr ~1.2-1.3 since at least 2018 -Cr 1.25 today, stable ; electrolytes normal -Continue follow-up with PCP for further management  Orders Placed This Encounter  Procedures  . CBC with Differential (Cancer Center Only)    Standing Status:   Future    Standing Expiration Date:   02/22/2021  . CMP (Cancer Center only)    Standing Status:   Future    Standing Expiration Date:   02/22/2021  . D-dimer, quantitative (not at [facility])    Standing Status:   Future    Standing Expiration Date:   02/22/2021   The total time spent in the encounter was 30 minutes, including face-to-face time with the patient, review of various tests results, order additional studies/medications, documentation, and coordination of care plan.   All questions were answered. The patient knows to call the clinic with any problems, questions or concerns. No barriers to learning was detected.  Return in 6 months for labs and clinic follow-up.  [facility], MD 5/14/202110:15 AM  CHIEF COMPLAINT: "I am doing pretty good"  INTERVAL HISTORY: Mr. 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  Patient reports that he has been doing well on Eliquis, and he denies any abnormal bleeding or excessive bruising.  He has been trying to be active and goes to the gym every morning.  He had some low back pain recently, for which he went to physical therapy for a few sessions, and the back pain has resolved.  He denies any complaint today.  REVIEW OF SYSTEMS:   Constitutional: ( - ) fevers, ( - )  chills , ( - ) night sweats Eyes: ( - ) blurriness of vision, ( - ) double vision, ( - )  watery eyes Ears, nose, mouth, throat, and face: ( - ) mucositis, ( - ) sore throat Respiratory: ( - ) cough, ( - ) dyspnea, ( - ) wheezes Cardiovascular: ( - ) palpitation, ( - ) chest discomfort, ( - ) lower extremity swelling Gastrointestinal:  ( - ) nausea, ( - ) heartburn, ( - ) change in bowel habits Skin: ( - )  abnormal skin rashes Lymphatics: ( - ) new lymphadenopathy, ( - ) easy bruising Neurological: ( - ) numbness, ( - ) tingling, ( - ) new weaknesses Behavioral/Psych: ( - ) mood change, ( - ) new changes  All other systems were reviewed with the patient and are negative.  SUMMARY OF ONCOLOGIC HISTORY: Oncology History   No history exists.    I have reviewed the past medical history, past surgical history, social history and family history with the patient and they are unchanged from previous note.  ALLERGIES:  is allergic to hydrocodone; oxycodone; and latex.  MEDICATIONS:  Current Outpatient Medications  Medication Sig Dispense Refill  . acetaminophen (TYLENOL) 500 MG tablet Take 500 mg by mouth as needed for mild pain or headache.     Melene Muller ON 02/16/2020] apixaban (ELIQUIS) 2.5 MG TABS tablet Take 1 tablet (2.5 mg total) by mouth 2 (two) times daily. 180 tablet 3  . bisoprolol-hydrochlorothiazide (ZIAC) 5-6.25 MG tablet Take 1 tablet by mouth daily. Okay to take a second tablet if BP more than 140/85 180 tablet 2  . Cholecalciferol (VITAMIN D3) 2000 units TABS Take 2,000 Units by mouth daily.     . diclofenac Sodium (VOLTAREN) 1 % GEL Apply 2 g topically 4 (four) times daily. 100 g 1  . DM-GG & DM-APAP-CPM (CORICIDIN HBP DAY/NIGHT COLD) 10-20 &15-200-2 MG MISC Take 1 tablet by mouth daily as needed (allergies).    Marland Kitchen doxazosin (CARDURA) 4 MG tablet Take 1 tablet (4 mg total) by mouth at bedtime. 90 tablet 1  . Flaxseed Oil OIL Take 1,400 mg by mouth daily.     . fluticasone (FLONASE) 50 MCG/ACT nasal spray Place 2 sprays into both nostrils daily as needed for allergies or rhinitis. 48 g 3  . OVER THE COUNTER MEDICATION Prostate Plus Health Complex w/ Cran-Max-Cranberry 1 daily     No current facility-administered medications for this visit.    PHYSICAL EXAMINATION: ECOG PERFORMANCE STATUS: 1 - Symptomatic but completely ambulatory  Today's Vitals   01/19/20 1003  BP: (!) 142/86   Pulse: (!) 51  Resp: 16  Temp: (!) 96.9 F (36.1 C)  TempSrc: Temporal  SpO2: 100%  Weight: 205 lb (93 kg)  PainSc: 2    Body mass index is 32.11 kg/m.  Filed Weights   01/19/20 1003  Weight: 205 lb (93 kg)    GENERAL: alert, no distress and comfortable SKIN: skin color, texture, turgor are normal, no rashes or significant lesions EYES: conjunctiva are pink and non-injected, sclera clear OROPHARYNX: no exudate, no erythema; lips, buccal mucosa, and tongue normal  NECK: supple, non-tender LUNGS: clear to auscultation with normal breathing effort HEART: regular rate & rhythm and no murmurs and no lower extremity edema ABDOMEN: soft, non-tender, non-distended, normal bowel sounds Musculoskeletal: no cyanosis of digits and no clubbing  PSYCH: alert & oriented x 3, fluent speech  LABORATORY DATA:  I have reviewed the data as listed    Component Value Date/Time   NA 139 01/19/2020 0916   K 4.8 01/19/2020 0916   CL 103 01/19/2020 0916  CO2 29 01/19/2020 0916   GLUCOSE 132 (H) 01/19/2020 0916   BUN 24 (H) 01/19/2020 0916   CREATININE 1.25 (H) 01/19/2020 0916   CALCIUM 10.0 01/19/2020 0916   PROT 7.1 01/19/2020 0916   ALBUMIN 4.4 01/19/2020 0916   AST 16 01/19/2020 0916   ALT 15 01/19/2020 0916   ALKPHOS 56 01/19/2020 0916   BILITOT 0.6 01/19/2020 0916   GFRNONAA 54 (L) 01/19/2020 0916   GFRAA >60 01/19/2020 0916    No results found for: SPEP, UPEP  Lab Results  Component Value Date   WBC 6.5 01/19/2020   NEUTROABS 3.6 01/19/2020   HGB 14.9 01/19/2020   HCT 44.0 01/19/2020   MCV 87.6 01/19/2020   PLT 198 01/19/2020      Chemistry      Component Value Date/Time   NA 139 01/19/2020 0916   K 4.8 01/19/2020 0916   CL 103 01/19/2020 0916   CO2 29 01/19/2020 0916   BUN 24 (H) 01/19/2020 0916   CREATININE 1.25 (H) 01/19/2020 0916      Component Value Date/Time   CALCIUM 10.0 01/19/2020 0916   ALKPHOS 56 01/19/2020 0916   AST 16 01/19/2020 0916   ALT 15  01/19/2020 0916   BILITOT 0.6 01/19/2020 0916       RADIOGRAPHIC STUDIES: I have personally reviewed the radiological images as listed below and agreed with the findings in the report. No results found.

## 2020-03-18 DIAGNOSIS — H903 Sensorineural hearing loss, bilateral: Secondary | ICD-10-CM | POA: Diagnosis not present

## 2020-03-21 ENCOUNTER — Other Ambulatory Visit: Payer: Self-pay

## 2020-03-21 ENCOUNTER — Emergency Department (HOSPITAL_COMMUNITY)
Admission: EM | Admit: 2020-03-21 | Discharge: 2020-03-21 | Disposition: A | Discharge status: Home / Self Care | Payer: Medicare Other | Attending: Emergency Medicine | Admitting: Emergency Medicine

## 2020-03-21 ENCOUNTER — Encounter (HOSPITAL_COMMUNITY): Payer: Self-pay | Admitting: Obstetrics and Gynecology

## 2020-03-21 ENCOUNTER — Telehealth: Payer: Self-pay

## 2020-03-21 ENCOUNTER — Emergency Department (HOSPITAL_COMMUNITY): Payer: Medicare Other

## 2020-03-21 DIAGNOSIS — I493 Ventricular premature depolarization: Secondary | ICD-10-CM | POA: Diagnosis not present

## 2020-03-21 DIAGNOSIS — Z91040 Latex allergy status: Secondary | ICD-10-CM | POA: Diagnosis not present

## 2020-03-21 DIAGNOSIS — I10 Essential (primary) hypertension: Secondary | ICD-10-CM | POA: Insufficient documentation

## 2020-03-21 DIAGNOSIS — R001 Bradycardia, unspecified: Secondary | ICD-10-CM | POA: Diagnosis not present

## 2020-03-21 DIAGNOSIS — R008 Other abnormalities of heart beat: Secondary | ICD-10-CM | POA: Diagnosis not present

## 2020-03-21 DIAGNOSIS — I498 Other specified cardiac arrhythmias: Secondary | ICD-10-CM | POA: Insufficient documentation

## 2020-03-21 DIAGNOSIS — I4949 Other premature depolarization: Secondary | ICD-10-CM | POA: Insufficient documentation

## 2020-03-21 LAB — CBC WITH DIFFERENTIAL/PLATELET
Abs Immature Granulocytes: 0.01 10*3/uL (ref 0.00–0.07)
Basophils Absolute: 0 10*3/uL (ref 0.0–0.1)
Basophils Relative: 0 %
Eosinophils Absolute: 0 10*3/uL (ref 0.0–0.5)
Eosinophils Relative: 1 %
HCT: 43.5 % (ref 39.0–52.0)
Hemoglobin: 14.8 g/dL (ref 13.0–17.0)
Immature Granulocytes: 0 %
Lymphocytes Relative: 35 %
Lymphs Abs: 2.3 10*3/uL (ref 0.7–4.0)
MCH: 30 pg (ref 26.0–34.0)
MCHC: 34 g/dL (ref 30.0–36.0)
MCV: 88.2 fL (ref 80.0–100.0)
Monocytes Absolute: 0.4 10*3/uL (ref 0.1–1.0)
Monocytes Relative: 7 %
Neutro Abs: 3.7 10*3/uL (ref 1.7–7.7)
Neutrophils Relative %: 57 %
Platelets: 196 10*3/uL (ref 150–400)
RBC: 4.93 MIL/uL (ref 4.22–5.81)
RDW: 13.4 % (ref 11.5–15.5)
WBC: 6.5 10*3/uL (ref 4.0–10.5)
nRBC: 0 % (ref 0.0–0.2)

## 2020-03-21 LAB — BASIC METABOLIC PANEL
Anion gap: 12 (ref 5–15)
BUN: 25 mg/dL — ABNORMAL HIGH (ref 8–23)
CO2: 26 mmol/L (ref 22–32)
Calcium: 9.5 mg/dL (ref 8.9–10.3)
Chloride: 99 mmol/L (ref 98–111)
Creatinine, Ser: 1.18 mg/dL (ref 0.61–1.24)
GFR calc Af Amer: >60 mL/min (ref >60)
GFR calc non Af Amer: 58 mL/min — ABNORMAL LOW (ref >60)
Glucose, Bld: 94 mg/dL (ref 70–99)
Potassium: 4.2 mmol/L (ref 3.5–5.1)
Sodium: 137 mmol/L (ref 135–145)

## 2020-03-21 LAB — MAGNESIUM: Magnesium: 2.4 mg/dL (ref 1.7–2.4)

## 2020-03-21 LAB — TSH: TSH: 2.114 u[IU]/mL (ref 0.350–4.500)

## 2020-03-21 MED ORDER — SODIUM CHLORIDE 0.9% FLUSH: 3.0000 mL | Freq: Once | INTRAVENOUS | Status: DC

## 2020-03-21 NOTE — Telephone Encounter (Signed)
noted 

## 2020-03-21 NOTE — ED Notes (Signed)
Pt discharged from this ED in stable condition at this time. All discharge instructions and follow up care reviewed with pt with no further questions at this time. Pt ambulatory with steady gait, clear speech.  

## 2020-03-21 NOTE — ED Triage Notes (Signed)
Patient reports to the ER sent by his PCP. Patient reports his HR has been running an average of 37. Patient reports he has a cardiologist  But they have never talked about a pacemaker

## 2020-03-21 NOTE — Discharge Instructions (Signed)
Your heart monitoring here in the ED today is consistent with your known history of premature ventricular contractions. Your heart rate is actually around 70-80 beats per minute. Please follow-up with your cardiologist. Return to the ED as needed.

## 2020-03-21 NOTE — Telephone Encounter (Signed)
Patient currently in ER for evaluation.    [facility] [place] [facility] - Client TELEPHONE ADVICE RECORD [facility] Patient Name: Timothy Stone Gender: Male DOB: 05/05/1941 Age: 79 Y 9 M 19 D Return Phone Number: [phone] (Primary), [phone] (Secondary) Address: City/State/Zip: [place] [address] Client [facility] [place] [facility] - Client Client Site [facility] [place] - Day Physician Willow Ora - MD Contact Type Call Who Is Calling Patient / Member / Family / Caregiver Call Type Triage / Clinical Relationship To Patient Self Return Phone Number [phone] (Primary) Chief Complaint BLOOD PRESSURE LOW - Systolic (top number) 90 or less with dizzy or weak symptoms Reason for Call Symptomatic / Request for Health Information Initial Comment Caller states he has low pulse and dizziness, bp around 100/50 with average pulse 35, warm xfer from office for triage due to no appts available till next week GOTO Facility Not Listed [facility] Translation No Nurse Assessment Nurse: Hermelinda Dellen, RN, Brandi Date/Time (Eastern Time): 03/21/2020 1:23:15 PM Confirm and document reason for call. If symptomatic, describe symptoms. ---Caller reports; HR: 35, BP @ 11am 100/50. Mild dizziness when getting home from the gym. 121/66 before bp meds. Then 110/52 @ 11 after taking bp med. Has the patient had close contact with a person known or suspected to have the novel coronavirus illness OR traveled / lives in area with major community spread (including international travel) in the last 14 days from the onset of symptoms? * If Asymptomatic, screen for exposure and travel within the last 14 days. ---No Does the patient have any new or worsening symptoms? ---Yes Will a triage be completed? ---Yes Related visit to physician within the last 2 weeks? ---No Does the PT have any chronic conditions? (i.e. diabetes, asthma, this includes High risk factors for  pregnancy, etc.) ---Yes List chronic conditions. ---HTN; ziac 5-6.25mg  once daily Blood clot in jan; eliquis Is this a behavioral health or substance abuse call? ---No Guidelines Guideline Title Affirmed Question Affirmed Notes Nurse Date/Time (Eastern Time) Blood Pressure - Low Patient sounds very sick or weak to the triager Hermelinda Dellen, RN, Brandi 03/21/2020 1:27:23 PM PLEASE NOTE: All timestamps contained within this report are represented as [place] Standard Time. CONFIDENTIALTY NOTICE: This fax transmission is intended only for the addressee. It contains information that is legally privileged, confidential or otherwise protected from use or disclosure. If you are not the intended recipient, you are strictly prohibited from reviewing, disclosing, copying using or disseminating any of this information or taking any action in reliance on or regarding this information. If you have received this fax in error, please notify us immediately by telephone so that we can arrange for its return to [place]. Phone: [phone], Toll-Free: [phone], Fax: [phone] Page: 2 of 2 Call Id: [id number] Disp. Time Lamount Cohen Time) Disposition Final User 03/21/2020 1:20:43 PM Send to Urgent Queue Gasper Sells 03/21/2020 1:31:07 PM Go to ED Now (or PCP triage) Yes Hermelinda Dellen, RN, Brandi Caller Disagree/Comply Comply Caller Understands Yes PreDisposition InappropriateToAsk Care Advice Given Per Guideline GO TO ED NOW (OR PCP TRIAGE): ANOTHER ADULT SHOULD DRIVE: * It is better and safer if another adult drives instead of you. Comments User: Marinda Elk, RN Date/Time Lamount Cohen Time): 03/21/2020 1:30:25 PM Current BP: 100/50 pulse: 30 Referrals GO TO FACILITY OTHER - SPECIFY

## 2020-03-21 NOTE — ED Provider Notes (Signed)
Medical screening examination/treatment/procedure(s) were conducted as a shared visit with non-physician practitioner(s) and myself.  I personally evaluated the patient during the encounter. Briefly, the patient is a 79 y.o. male who presents to the ED with bradycardia.  However EKG shows sinus rhythm with some bigeminy.  History of the same.  Heart rate in the 80s.  Asymptomatic.  Follows with cardiology for the same.  No chest pain, no shortness of breath.  States that he had a new blood pressure cuff last week that gives his heart rate.  Heart rate was in the 30s.  However this is because patient is having intermittent PVCs.  Lab work showed no significant anemia, electrolyte abnormality, kidney injury.  Has had echocardiogram in the past that is unremarkable.  Has worn Holter monitor in the past that showed about 20% PVCs.  Seems that he is basically at his baseline.  Can follow-up with cardiology and told to return to the ED if symptoms worsen.  This chart was dictated using voice recognition software.  Despite best efforts to proofread,  errors can occur which can change the documentation meaning.      EKG Interpretation  Date/Time:  Thursday March 21 2020 15:28:43 EDT Ventricular Rate:  80 PR Interval:    QRS Duration: 91 QT Interval:  469 QTC Calculation: 402 R Axis:   47 Text Interpretation: Sinus rhythm Ventricular bigeminy Prolonged PR interval Confirmed by Virgina Norfolk [phone]) on 03/21/2020 3:50:42 PM           Virgina Norfolk, DO 03/21/20 1727

## 2020-03-21 NOTE — ED Provider Notes (Signed)
[facility]-EMERGENCY DEPT Provider Note   CSN: [id number] Arrival date & time: 03/21/20  1409     History Chief Complaint  Patient presents with  . Bradycardia    Timothy Stone. is a 79 y.o. male with past medical history of PVCs w ventricular bigeminy, DVT on Eliquis, hypertension, presenting to the ED for bradycardia.  Patient states his blood pressure cuff at home has been telling him he has had a heart rate in the 30s for about 6 weeks now.  He wonders if it is his new cuff because he got a new one right around the time this started.  He had a brief episode of lightheadedness today after getting out of his car that lasted about 10 seconds and resolved. Otherwise, he states it has not been bothering him; no palpitations, shortness of breath, weakness, fatigue, or chest pain.  However, he told his wife today about his low reading and she was concerned, they called PCP and they recommended he report to the ED. 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 Per chart review he is follow by cardiology, worked up back in 2019 for the same. Holter monitoring showed about 19% PVC burden  The history is provided by the patient and medical records.       Past Medical History:  Diagnosis Date  . Actinic keratosis    hyperplastic aktinic keratosis forehead s/p surgery Dr Park Liter  . BPH (benign prostatic hyperplasia)    s/p TUNA  . Chronic cystitis    Dr. Liliane Shi  . DVT (deep venous thrombosis) (HCC) 2005   no know triggers per pt  . HTN (hypertension)    dx in the 70s  . OSA (obstructive sleep apnea)    on CPAP  . Recurrent UTI   . Umbilical hernia    no surgery  . Vitamin D deficiency     Patient Active Problem List   Diagnosis Date Noted  . DVT (deep venous thrombosis) (HCC) 08/21/2019  . PVC (premature ventricular contraction) 06/29/2018  . PCP NOTES >>>>>>>>>>>>>>>>>>>> 02/12/2016  . Umbilical hernia 11/01/2012  . Hyperglycemia 01/20/2012   . Annual physical exam 08/10/2011  . BPH (benign prostatic hyperplasia)   . Recurrent UTI   . HYPERTRIGLYCERIDEMIA 11/16/2008  . Obstructive sleep apnea 03/07/2008  . Essential hypertension 02/21/2007  . ALLERGIC RHINITIS 02/21/2007    Past Surgical History:  Procedure Laterality Date  . PROSTATE SURGERY  2003   TUNA around 2003       Family History  Problem Relation Age of Onset  . Diabetes Mother        Judie Petit, sister  . Heart attack Father 48  . Breast cancer Sister   . Rectal cancer Sister 46       @ time of her first Cscope  . Lung cancer Brother 26       smoker   . Prostate cancer Neg Hx     Social History   Tobacco Use  . Smoking status: Never Smoker  . Smokeless tobacco: Never Used  Vaping Use  . Vaping Use: Never used  Substance Use Topics  . Alcohol use: Yes    Alcohol/week: 0.0 standard drinks    Comment: 2x per week  . Drug use: No    Home Medications Prior to Admission medications   Medication Sig Start Date End Date Taking? Authorizing Provider  acetaminophen (TYLENOL) 500 MG tablet Take 500 mg by mouth as needed for mild pain or headache.  Yes [provider]  apixaban (ELIQUIS) 2.5 MG TABS tablet Take 1 tablet (2.5 mg total) by mouth 2 (two) times daily. 02/16/20 05/16/20 Yes Arthur Holms, MD  bisoprolol-hydrochlorothiazide [facility]) 5-6.25 MG tablet Take 1 tablet by mouth daily. Okay to take a second tablet if BP more than 140/85 07/18/19  Yes Paz, Nolon Rod, MD  Cholecalciferol (VITAMIN D3) 2000 units TABS Take 2,000 Units by mouth daily.    Yes [provider]  DM-GG & DM-APAP-CPM (CORICIDIN HBP DAY/NIGHT COLD) 10-20 &15-200-2 MG MISC Take 1 tablet by mouth daily as needed (allergies).   Yes [provider]  doxazosin (CARDURA) 4 MG tablet Take 1 tablet (4 mg total) by mouth at bedtime. Patient taking differently: Take 4 mg by mouth daily.  11/29/17  Yes Paz, Nolon Rod, MD  Flaxseed Oil OIL Take 1,400 mg by mouth daily.    Yes  [provider]  fluticasone (FLONASE) 50 MCG/ACT nasal spray Place 2 sprays into both nostrils daily as needed for allergies or rhinitis. 06/06/19  Yes Paz, Nolon Rod, MD  OVER THE COUNTER MEDICATION Take 1 tablet by mouth daily. Prostate Plus Health Complex w/ Cran-Max-Cranberry 1 daily    Yes [provider]  diclofenac Sodium (VOLTAREN) 1 % GEL Apply 2 g topically 4 (four) times daily. Patient not taking: Reported on 03/21/2020 08/11/19   Gwyneth Sprout, MD    Allergies    Hydrocodone, Oxycodone, and Latex  Review of Systems   Review of Systems  All other systems reviewed and are negative.   Physical Exam Updated Vital Signs BP (!) 153/93 (BP Location: Right Arm)   Pulse 60   Temp 97.9 F (36.6 C) (Oral)   Resp 13   Ht 5\' 7"  (1.702 m)   Wt 88.5 kg   SpO2 96%   BMI 30.54 kg/m   Physical Exam Vitals and nursing note reviewed.  Constitutional:      General: He is not in acute distress.    Appearance: He is well-developed. He is not ill-appearing.  HENT:     Head: Normocephalic and atraumatic.  Eyes:     Conjunctiva/sclera: Conjunctivae normal.  Cardiovascular:     Rate and Rhythm: Normal rate.     Pulses: Normal pulses.     Heart sounds: Normal heart sounds.     Comments: Intermittent runs of PVCs with ventricular bigeminy. Rate around 70bpm with bigeminy Pulmonary:     Effort: Pulmonary effort is normal. No respiratory distress.     Breath sounds: Normal breath sounds.  Abdominal:     Palpations: Abdomen is soft.  Musculoskeletal:     Comments: Trace pedal edema BLE  Skin:    General: Skin is warm.  Neurological:     Mental Status: He is alert.  Psychiatric:        Behavior: Behavior normal.     ED Results / Procedures / Treatments   Labs (all labs ordered are listed, but only abnormal results are displayed) Labs Reviewed  BASIC METABOLIC PANEL - Abnormal; Notable for the following components:      Result Value   BUN 25 (*)    GFR calc  non Af Amer 58 (*)    All other components within normal limits  CBC WITH DIFFERENTIAL/PLATELET  MAGNESIUM  TSH    EKG EKG Interpretation  Date/Time:  Thursday March 21 2020 15:28:43 EDT Ventricular Rate:  80 PR Interval:    QRS Duration: 91 QT Interval:  469 QTC Calculation: 402 R  Axis:   47 Text Interpretation: Sinus rhythm Ventricular bigeminy Prolonged PR interval Confirmed by Virgina Norfolk [phone]) on 03/21/2020 3:50:42 PM   Radiology DG Chest 2 View  Result Date: 03/21/2020 CLINICAL DATA:  Bradycardia. EXAM: CHEST - 2 VIEW COMPARISON:  Prior chest radiographs 11/02/2012 FINDINGS: Heart size within normal limits. There is no appreciable airspace consolidation. No evidence of pleural effusion or pneumothorax. No acute bony abnormality identified. Thoracic spondylosis. IMPRESSION: No evidence of active cardiopulmonary disease. Electronically Signed   By: Jackey Loge DO   On: 03/21/2020 14:56    Procedures Procedures (including critical care time)  Medications Ordered in ED Medications  sodium chloride flush (NS) 0.9 % injection 3 mL (has no administration in time range)    ED Course  I have reviewed the triage vital signs and the nursing notes.  Pertinent labs & imaging results that were available during my care of the patient were reviewed by me and considered in my medical decision making (see chart for details).  Clinical Course as of Mar 21 1753  Thu Mar 21, 2020  1558 Ventricular bigeminy, heart rate is actually 70-80  Pulse Rate(!): 31 [JR]    Clinical Course User Index [JR] Etter Royall, [place] N, PA-C   MDM Rules/Calculators/A&P                          Patient with longstanding history of PVCs with ventricular bigeminy, followed by cardiology for this.  He had work-up in 2019, Holter monitor at that time revealed about 19% PVC burden, he was noted to have ventricular bigeminy at that time as well.  He is here today because blood pressure cuff at home is  reading low heart rates however this is likely due to the PVCs with ventricular bigeminy seen today in the ED.  His heart rate is actually around 70 to 80 bpm with the bigeminy.  He is well-appearing.  He has had no new medications that he reports.  He is relatively asymptomatic and has no complaints today.  Electrolytes here in the ED are reassuring.  Patient discussed with the daughter by Dr. Lockie Mola.  He is appropriate for discharge with outpatient follow-up with his cardiologist.  Discussed results, findings, treatment and follow up. Patient advised of return precautions. Patient verbalized understanding and agreed with plan.  Final Clinical Impression(s) / ED Diagnoses Final diagnoses:  Premature ventricular contractions (PVCs) (VPCs)  Ventricular bigeminy    Rx / DC Orders ED Discharge Orders    None       Kennon Encinas, [place] N, PA-C 03/21/20 1754    Virgina Norfolk, DO 03/21/20 2023

## 2020-03-25 ENCOUNTER — Other Ambulatory Visit: Payer: Self-pay

## 2020-03-25 ENCOUNTER — Ambulatory Visit (INDEPENDENT_AMBULATORY_CARE_PROVIDER_SITE_OTHER): Payer: Medicare Other | Admitting: Internal Medicine

## 2020-03-25 ENCOUNTER — Encounter: Payer: Self-pay | Admitting: Internal Medicine

## 2020-03-25 VITALS — BP 132/80 | HR 54 | Temp 97.9°F | Resp 18 | Ht 67.0 in | Wt 199.5 lb

## 2020-03-25 DIAGNOSIS — I498 Other specified cardiac arrhythmias: Secondary | ICD-10-CM

## 2020-03-25 NOTE — Progress Notes (Signed)
Subjective:    Patient ID: Timothy Stone., male    DOB: 01-25-1941, 79 y.o.   MRN: [id number]  DOS:  03/25/2020 Type of visit - description: ER follow-up  Went to the ER 03/21/2020 Reason for the visit was bradycardia, the ER work-up included:   BMP, CBC, magnesium, TSH were all satisfactory.  Chest x-ray negative. EKG showed bigeminy  Review of Systems The patient reports he is feeling well. Before the ER visit he got slt dizzy for few seconds after he got out of the car. No further symptoms Specifically denies chest pain, difficulty breathing, lower extremity edema or palpitations.  Good med compliance.  Past Medical History:  Diagnosis Date  . Actinic keratosis    hyperplastic aktinic keratosis forehead s/p surgery Dr Park Liter  . BPH (benign prostatic hyperplasia)    s/p TUNA  . Chronic cystitis    Dr. Liliane Shi  . DVT (deep venous thrombosis) (HCC) 2005   no know triggers per pt  . HTN (hypertension)    dx in the 70s  . OSA (obstructive sleep apnea)    on CPAP  . Recurrent UTI   . Umbilical hernia    no surgery  . Vitamin D deficiency     Past Surgical History:  Procedure Laterality Date  . PROSTATE SURGERY  2003   TUNA around 2003    Allergies as of 03/25/2020      Reactions   Hydrocodone Shortness Of Breath   Oxycodone    Latex Hives      Medication List       Accurate as of March 25, 2020 11:59 PM. If you have any questions, ask your nurse or doctor.        acetaminophen 500 MG tablet Commonly known as: TYLENOL Take 500 mg by mouth as needed for mild pain or headache.   apixaban 2.5 MG Tabs tablet Commonly known as: Eliquis Take 1 tablet (2.5 mg total) by mouth 2 (two) times daily.   bisoprolol-hydrochlorothiazide 5-6.25 MG tablet Commonly known as: ZIAC Take 1 tablet by mouth daily. Okay to take a second tablet if BP more than 140/85   Coricidin HBP Day/Night Cold 10-20 &15-200-2 MG Misc Generic drug: DM-GG & DM-APAP-CPM Take 1 tablet  by mouth daily as needed (allergies).   diclofenac Sodium 1 % Gel Commonly known as: VOLTAREN Apply 2 g topically 4 (four) times daily.   doxazosin 4 MG tablet Commonly known as: CARDURA Take 1 tablet (4 mg total) by mouth at bedtime. What changed: when to take this   Flaxseed Oil Oil Take 1,400 mg by mouth daily.   fluticasone 50 MCG/ACT nasal spray Commonly known as: FLONASE Place 2 sprays into both nostrils daily as needed for allergies or rhinitis.   OVER THE COUNTER MEDICATION Take 1 tablet by mouth daily. Prostate Plus Health Complex w/ Cran-Max-Cranberry 1 daily   Vitamin D3 50 MCG (2000 UT) Tabs Take 2,000 Units by mouth daily.          Objective:   Physical Exam BP 132/80 (BP Location: Left Arm, Patient Position: Sitting, Cuff Size: Normal)   Pulse (!) 54   Temp 97.9 F (36.6 C) (Oral)   Resp 18   Ht 5\' 7"  (1.702 m)   Wt 199 lb 8 oz (90.5 kg)   SpO2 96%   BMI 31.25 kg/m  General:   Well developed, NAD, BMI noted. HEENT:  Normocephalic . Face symmetric, atraumatic Lungs:  CTA B Normal respiratory effort, no  intercostal retractions, no accessory muscle use. Heart: Bradycardic,  no murmur. Pulse in the 40s Lower extremities: no pretibial edema bilaterally  Skin: Not pale. Not jaundice Neurologic:  alert & oriented X3.  Speech normal, gait appropriate for age and unassisted Psych--  Cognition and judgment appear intact.  Cooperative with normal attention span and concentration.  Behavior appropriate. No anxious or depressed appearing.      Assessment     Assessment: Prediabetes HTN Bigeminy: Saw cardiology 2019, Holter: 80% QRS with PVCs.  Echo was done, see report BPH, recurrent UTIs, chronic cystitis OSA, on CPAP Umbilical hernia Actinic keratosis; no derm eval recently as off 02/2018 Tinnitus  H/o vitamin D deficiency H/o R leg  DVT, 2005 and 2020, unprovoked   PLAN: Bigeminy: Patient went to the ER recently due to bradycardia, he  was found to be in bigeminy (not a new diagnosis), prior to the ER he was slightly dizzy but suspect dizziness is not related to bigeminy on clinical grounds. Has not seen cardiology since October 2019, they recommended stress test which never happened. We will ask cardiology to see the patient again in a non- emergent fashion, patient is not sure if he would like to proceed with further testing since he feels well.  He is active, goes to the gym without chest pain, difficulty breathing. RTC already scheduled for October.   This visit occurred during the SARS-CoV-2 public health emergency.  Safety protocols were in place, including screening questions prior to the visit, additional usage of staff PPE, and extensive cleaning of exam room while observing appropriate contact time as indicated for disinfecting solutions.

## 2020-03-25 NOTE — Progress Notes (Signed)
Pre visit review using our clinic review tool, if applicable. No additional management support is needed unless otherwise documented below in the visit note. 

## 2020-03-26 ENCOUNTER — Ambulatory Visit: Payer: Medicare Other | Admitting: Internal Medicine

## 2020-03-26 NOTE — Assessment & Plan Note (Signed)
Bigeminy: Patient went to the ER recently due to bradycardia, he was found to be in bigeminy (not a new diagnosis), prior to the ER he was slightly dizzy but suspect dizziness is not related to bigeminy on clinical grounds. Has not seen cardiology since October 2019, they recommended stress test which never happened. We will ask cardiology to see the patient again in a non- emergent fashion, patient is not sure if he would like to proceed with further testing since he feels well.  He is active, goes to the gym without chest pain, difficulty breathing. RTC already scheduled for October.

## 2020-03-27 NOTE — Progress Notes (Signed)
Cardiology Office Note:    Date:  03/28/2020   ID:  [name], DOB 1941-06-05, MRN [id number]  PCP:  Wanda Plump, MD  Cardiologist:  Lesleigh Noe, MD   Referring MD: Wanda Plump, MD   Chief Complaint  Patient presents with  . Hypertension  . Bradycardia  . Advice Only    PVC    History of Present Illness:    Timothy Stone. is a 79 y.o. male with a hx of PVC's who returns at the request of Dr. Drue Novel for bigeminy. Other medical problems include DVT and Eliquis therapy, hypertension, OSA,   A recent episode of lightheadedness that occurred after getting out of his car led to a call to primary physician Dr. Porfirio Oar.  He spoke with the nurses who asked him to provide data concerning heart rate and blood pressure.  Using his new blood pressure device the heart rates were consistently in the 30 bpm range.  They requested that he go to the emergency room to be checked.  He was noted to be in ventricular bigeminy.  Totally asymptomatic relative to palpitations.  This was previously evaluated 2 years ago.  LV function was low normal of 50 to 55% EF.  He was asymptomatic.  He had 18 to 19% burden of PVCs.  He goes to the gym 6 days a week.  He gets greater than 40 minutes of moderate activity both aerobic and isometric.  He has not had syncope.  He does have a prior history of dizziness but it resolved after Dr. Drue Novel decreased his medication regimen.  He is on beta-blocker therapy in the form of combined bisoprolol and HCTZ.  He denies angina.  He has risk factors for vascular disease that include hyperlipidemia, prediabetes, male sex, and age.  Past Medical History:  Diagnosis Date  . Actinic keratosis    hyperplastic aktinic keratosis forehead s/p surgery Dr Park Liter  . BPH (benign prostatic hyperplasia)    s/p TUNA  . Chronic cystitis    Dr. Liliane Shi  . DVT (deep venous thrombosis) (HCC) 2005   no know triggers per pt  . HTN (hypertension)    dx in the 70s  . OSA  (obstructive sleep apnea)    on CPAP  . Recurrent UTI   . Umbilical hernia    no surgery  . Vitamin D deficiency     Past Surgical History:  Procedure Laterality Date  . PROSTATE SURGERY  2003   TUNA around 2003    Current Medications: Current Meds  Medication Sig  . acetaminophen (TYLENOL) 500 MG tablet Take 500 mg by mouth as needed for mild pain or headache.   Marland Kitchen apixaban (ELIQUIS) 2.5 MG TABS tablet Take 1 tablet (2.5 mg total) by mouth 2 (two) times daily.  . bisoprolol-hydrochlorothiazide (ZIAC) 5-6.25 MG tablet Take 1 tablet by mouth daily. Okay to take a second tablet if BP more than 140/85  . Cholecalciferol (VITAMIN D3) 2000 units TABS Take 2,000 Units by mouth daily.   . diclofenac Sodium (VOLTAREN) 1 % GEL Apply 2 g topically 4 (four) times daily.  Marland Kitchen DM-GG & DM-APAP-CPM (CORICIDIN HBP DAY/NIGHT COLD) 10-20 &15-200-2 MG MISC Take 1 tablet by mouth daily as needed (allergies).  Marland Kitchen doxazosin (CARDURA) 4 MG tablet Take 1 tablet (4 mg total) by mouth at bedtime.  . Flaxseed Oil OIL Take 1,400 mg by mouth daily.   . fluticasone (FLONASE) 50 MCG/ACT nasal spray Place 2  sprays into both nostrils daily as needed for allergies or rhinitis.  Marland Kitchen OVER THE COUNTER MEDICATION Take 1 tablet by mouth daily. Prostate Plus Health Complex w/ Cran-Max-Cranberry 1 daily      Allergies:   Hydrocodone, Oxycodone, and Latex   Social History   Socioeconomic History  . Marital status: Married    Spouse name: Not on file  . Number of children: 4  . Years of education: Not on file  . Highest education level: Not on file  Occupational History  . 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 Smoking status: Never Smoker  . Smokeless tobacco: Never Used  Vaping Use  . Vaping Use: Never used  Substance and Sexual Activity  . Alcohol use: Yes    Alcohol/week: 0.0 standard drinks    Comment: 2x per week  . Drug use: No  .  Sexual activity: Not Currently  Other Topics Concern  . Not on file  Social History Narrative   Lives w/ wife         Social Determinants of Health   Financial Resource Strain:   . Difficulty of Paying Living Expenses:   Food Insecurity:   . Worried About Programme researcher, broadcasting/film/video in the Last Year:   . Barista in the Last Year:   Transportation Needs:   . Freight forwarder (Medical):   Marland Kitchen Lack of Transportation (Non-Medical):   Physical Activity:   . Days of Exercise per Week:   . Minutes of Exercise per Session:   Stress:   . Feeling of Stress :   Social Connections:   . Frequency of Communication with Friends and Family:   . Frequency of Social Gatherings with Friends and Family:   . Attends Religious Services:   . Active Member of Clubs or Organizations:   . Attends Banker Meetings:   Marland Kitchen Marital Status:      Family History: The patient's family history includes Breast cancer in his sister; Diabetes in his mother; Heart attack (age of onset: 50) in his father; Lung cancer (age of onset: 36) in his brother; Rectal cancer (age of onset: 42) in his sister. There is no history of Prostate cancer.  ROS:   Please see the history of present illness.    He has no complaints.  He has occasional dizziness.  This happens more frequently when he goes from lying to sitting.  He feels that there is some lightheadedness.  All other systems reviewed and are negative.  EKGs/Labs/Other Studies Reviewed:    The following studies were reviewed today:  Holter Monitor 2019: Study Highlights    Normal sinus rhythm and sinus bradycardia. No HR > 90 bpm  Frequent, predominately isolated PVC's in bigeminy and trigeminy.  PVC burden 19%  No pauses > 3 seconds   HEART RATE EPISODES Minimum HR: 40 BPM at 10:27:32 AM Maximum HR: 90 BPM at 11:50:01 AM Average HR: 58 BPM  ECHOCARDIOGRAM 2019: Study Conclusions   - Left ventricle: The cavity size was mildly  dilated. Wall  thickness was normal. Systolic function was normal. The estimated  ejection fraction was in the range of 50% to 55%. Doppler  parameters are consistent with both elevated ventricular  end-diastolic filling pressure and elevated left atrial filling  pressure.  - Mitral valve: There was mild regurgitation.  - Left atrium: The atrium was moderately dilated.  - Atrial septum: No defect or patent foramen  ovale was identified.   EKG:  EKG performed during an emergency room visit on 03/21/2020 revealed sinus rhythm with ventricular bigeminy with PVCs having a fixed coupling interval.  Normally conducted beats otherwise revealed a normal appearance.  PR interval is slightly less than 200 ms.  Recent Labs: 01/19/2020: ALT 15 03/21/2020: BUN 25; Creatinine, Ser 1.18; Hemoglobin 14.8; Magnesium 2.4; Platelets 196; Potassium 4.2; Sodium 137; TSH 2.114  Recent Lipid Panel    Component Value Date/Time   CHOL 164 02/28/2019 1306   TRIG 209.0 (H) 02/28/2019 1306   HDL 30.30 (L) 02/28/2019 1306   CHOLHDL 5 02/28/2019 1306   VLDL 41.8 (H) 02/28/2019 1306   LDLCALC 89 03/02/2018 0944   LDLDIRECT 113.0 02/28/2019 1306    Physical Exam:    VS:  BP 130/60   Pulse 66   Ht 5\' 7"  (1.702 m)   Wt 196 lb (88.9 kg)   SpO2 96%   BMI 30.70 kg/m     Wt Readings from Last 3 Encounters:  03/28/20 196 lb (88.9 kg)  03/25/20 199 lb 8 oz (90.5 kg)  03/21/20 195 lb (88.5 kg)     GEN: Is younger than stated age. No acute distress HEENT: Normal NECK: No JVD. LYMPHATICS: No lymphadenopathy CARDIAC: Regular irregular RR without murmur, gallop, or edema. VASCULAR:  Normal Pulses. No bruits. RESPIRATORY:  Clear to auscultation without rales, wheezing or rhonchi  ABDOMEN: Soft, non-tender, non-distended, No pulsatile mass, MUSCULOSKELETAL: No deformity  SKIN: Warm and dry NEUROLOGIC:  Alert and oriented x 3 PSYCHIATRIC:  Normal affect   ASSESSMENT:    1. PVC (premature ventricular  contraction)   2. Essential hypertension   3. Obstructive sleep apnea   4. HYPERTRIGLYCERIDEMIA    PLAN:    In order of problems listed above:  1. Ventricular bigeminy has been great documented.  2D Doppler echocardiogram will be done to reassess LV function.  If decreasing LV function we may consider therapy to suppress PVCs and prevent worsening in LV function.  If he is maintaining, perhaps nothing further will be done.  His prior monitor done in 2019 did demonstrate relatively slow heart rates with none above 90 bpm.  There clearly seems to be some evidence of sinus node dysfunction although this is aggravated by bisoprolol. 2. Borderline controlled measuring 140/70 today in the office. 3. Compliant with CPAP 4. Hyperlipidemia is a risk factor for vascular events. 5. No Covid issues other than in patients with social restrictions.   Medication Adjustments/Labs and Tests Ordered: Current medicines are reviewed at length with the patient today.  Concerns regarding medicines are outlined above.  No orders of the defined types were placed in this encounter.  No orders of the defined types were placed in this encounter.   There are no Patient Instructions on file for this visit.   Signed, 2020, MD  03/28/2020 9:52 AM    [facility]

## 2020-03-28 ENCOUNTER — Ambulatory Visit: Payer: Medicare Other | Admitting: Interventional Cardiology

## 2020-03-28 ENCOUNTER — Other Ambulatory Visit: Payer: Self-pay

## 2020-03-28 ENCOUNTER — Encounter: Payer: Self-pay | Admitting: Interventional Cardiology

## 2020-03-28 VITALS — BP 130/60 | HR 66 | Ht 67.0 in | Wt 196.0 lb

## 2020-03-28 DIAGNOSIS — E781 Pure hyperglyceridemia: Secondary | ICD-10-CM | POA: Diagnosis not present

## 2020-03-28 DIAGNOSIS — I10 Essential (primary) hypertension: Secondary | ICD-10-CM | POA: Diagnosis not present

## 2020-03-28 DIAGNOSIS — Z7189 Other specified counseling: Secondary | ICD-10-CM

## 2020-03-28 DIAGNOSIS — I493 Ventricular premature depolarization: Secondary | ICD-10-CM

## 2020-03-28 DIAGNOSIS — G4733 Obstructive sleep apnea (adult) (pediatric): Secondary | ICD-10-CM | POA: Diagnosis not present

## 2020-03-28 NOTE — Patient Instructions (Signed)
Medication Instructions:  Your physician recommends that you continue on your current medications as directed. Please refer to the Current Medication list given to you today.  *If you need a refill on your cardiac medications before your next appointment, please call your pharmacy*   Lab Work: None If you have labs (blood work) drawn today and your tests are completely normal, you will receive your results only by: Marland Kitchen MyChart Message (if you have MyChart) OR . A paper copy in the mail If you have any lab test that is abnormal or we need to change your treatment, we will call you to review the results.   Testing/Procedures: Your physician has requested that you have an echocardiogram. Echocardiography is a painless test that uses sound waves to create images of your heart. It provides your doctor with information about the size and shape of your heart and how well your heart's chambers and valves are working. This procedure takes approximately one hour. There are no restrictions for this procedure.     Follow-Up: At [facility], you and your health needs are our priority.  As part of our continuing mission to provide you with exceptional heart care, we have created designated Provider Care Teams.  These Care Teams include your primary Cardiologist (physician) and Advanced Practice Providers (APPs -  Physician Assistants and Nurse Practitioners) who all work together to provide you with the care you need, when you need it.  We recommend signing up for the patient portal called "MyChart".  Sign up information is provided on this After Visit Summary.  MyChart is used to connect with patients for Virtual Visits (Telemedicine).  Patients are able to view lab/test results, encounter notes, upcoming appointments, etc.  Non-urgent messages can be sent to your provider as well.   To learn more about what you can do with MyChart, go to [email or web address].    Your next appointment:   Follow up  as needed  The format for your next appointment:   In Person  Provider:   You may see Lesleigh Noe, MD or one of the following Advanced Practice Providers on your designated Care Team:    Norma Fredrickson, NP  Nada Boozer, NP  Georgie Chard, NP

## 2020-04-11 ENCOUNTER — Ambulatory Visit (HOSPITAL_COMMUNITY): Payer: Medicare Other | Attending: Internal Medicine

## 2020-04-11 ENCOUNTER — Other Ambulatory Visit: Payer: Self-pay

## 2020-04-11 DIAGNOSIS — I493 Ventricular premature depolarization: Secondary | ICD-10-CM

## 2020-04-11 DIAGNOSIS — I10 Essential (primary) hypertension: Secondary | ICD-10-CM | POA: Diagnosis not present

## 2020-04-11 DIAGNOSIS — G4733 Obstructive sleep apnea (adult) (pediatric): Secondary | ICD-10-CM | POA: Diagnosis not present

## 2020-04-12 LAB — ECHOCARDIOGRAM COMPLETE
Area-P 1/2: 2.73 cm2
S' Lateral: 3.8 cm

## 2020-04-16 ENCOUNTER — Telehealth: Payer: Self-pay | Admitting: *Deleted

## 2020-04-16 ENCOUNTER — Telehealth: Payer: Self-pay | Admitting: Radiology

## 2020-04-16 DIAGNOSIS — I493 Ventricular premature depolarization: Secondary | ICD-10-CM

## 2020-04-16 NOTE — Telephone Encounter (Signed)
Enrolled patient for a 3 day ZIo XT monitor to be mailed to patients home  

## 2020-04-16 NOTE — Telephone Encounter (Signed)
-----   Message from Lyn Records, MD sent at 04/14/2020  9:59 AM EDT ----- Let the patient know the LV function has decreased, likely due to PVC's. Needs 3 day Holter to quantitate PVC's A copy will be sent to Wanda Plump, MD

## 2020-04-16 NOTE — Telephone Encounter (Signed)
Spoke with Timothy Stone and reviewed results and recommendations per Dr. Smith.  Timothy Stone agreeable to plan.  

## 2020-04-18 ENCOUNTER — Other Ambulatory Visit (INDEPENDENT_AMBULATORY_CARE_PROVIDER_SITE_OTHER): Payer: Medicare Other

## 2020-04-18 DIAGNOSIS — I493 Ventricular premature depolarization: Secondary | ICD-10-CM

## 2020-05-01 DIAGNOSIS — I493 Ventricular premature depolarization: Secondary | ICD-10-CM | POA: Diagnosis not present

## 2020-05-02 ENCOUNTER — Telehealth: Payer: Self-pay | Admitting: Internal Medicine

## 2020-05-02 NOTE — Progress Notes (Signed)
  Chronic Care Management   Note  05/02/2020 Name: Becket Wecker. MRN: [id number] DOB: 06-23-1941  Dael Howland. is a 79 y.o. year old male who is a primary care patient of Wanda Plump, MD. I reached out to [name] by phone today in response to a referral sent by Mr. VICTORY STROLLO Jr.'s PCP, Wanda Plump, MD.   Mr. Doyon was given information about Chronic Care Management services today including:  1. CCM service includes personalized support from designated clinical staff supervised by his physician, including individualized plan of care and coordination with other care providers 2. 24/7 contact phone numbers for assistance for urgent and routine care needs. 3. Service will only be billed when office clinical staff spend 20 minutes or more in a month to coordinate care. 4. Only one practitioner may furnish and bill the service in a calendar month. 5. The patient may stop CCM services at any time (effective at the end of the month) by phone call to the office staff.   Patient agreed to services and verbal consent obtained.   Follow up plan:   Carley Perdue [employer] Scheduler

## 2020-05-08 ENCOUNTER — Other Ambulatory Visit: Payer: Self-pay

## 2020-05-08 DIAGNOSIS — I824Z1 Acute embolism and thrombosis of unspecified deep veins of right distal lower extremity: Secondary | ICD-10-CM

## 2020-05-08 MED ORDER — APIXABAN 2.5 MG PO TABS: 2.5000 mg | ORAL_TABLET | Freq: Two times a day (BID) | ORAL | 0 refills | Status: DC

## 2020-05-10 ENCOUNTER — Telehealth: Payer: Self-pay | Admitting: *Deleted

## 2020-05-10 DIAGNOSIS — I493 Ventricular premature depolarization: Secondary | ICD-10-CM

## 2020-05-10 NOTE — Telephone Encounter (Signed)
Spoke with pt and made him aware of results and recommendations.  Pt agreeable to plan.  

## 2020-05-10 NOTE — Telephone Encounter (Signed)
-----   Message from Lyn Records, MD sent at 05/08/2020 10:16 PM EDT ----- Let the patient know there is high PVC burden which is likely causing decrease in LV function.  Already on betablocker. Please refer to EP for ideas on therapy for PVC suppression - AAD vs Ablation. A copy will be sent to Wanda Plump, MD

## 2020-05-15 NOTE — Progress Notes (Signed)
Electrophysiology Office Note:    Date:  05/16/2020   ID:  Timothy Stone., DOB 06/05/1941, MRN [id number]  PCP:  Wanda Plump, MD  [facility] Cardiologist:  Lesleigh Noe, MD  [facility] Electrophysiologist:  Lanier Prude, MD   Referring MD: Lyn Records, MD   Chief Complaint: PVC  History of Present Illness:    Timothy Stone. is a 79 y.o. male presents to clinic for an evaluation of frequent PVCs at the request of Dr Katrinka Blazing. The patient expeirenced an episode of lightheadedness. During the episode, his heart rates were in the 30bpm. He went to the ER for an evaluation and was found to be in bigeminy. He was previously seen for PVC in 2019 when he was found to have a burden 18-19% with an EF 50-55%.  He tells me that he is very active without any limitations. He exercises daily at the gym with his wife. No syncope/presyncope.  Past Medical History:  Diagnosis Date  . Actinic keratosis    hyperplastic aktinic keratosis forehead s/p surgery Dr Park Liter  . BPH (benign prostatic hyperplasia)    s/p TUNA  . Chronic cystitis    Dr. Liliane Shi  . DVT (deep venous thrombosis) (HCC) 2005   no know triggers per pt  . HTN (hypertension)    dx in the 70s  . OSA (obstructive sleep apnea)    on CPAP  . Recurrent UTI   . Umbilical hernia    no surgery  . Vitamin D deficiency     Past Surgical History:  Procedure Laterality Date  . PROSTATE SURGERY  2003   TUNA around 2003    Current Medications: Current Meds  Medication Sig  . acetaminophen (TYLENOL) 500 MG tablet Take 500 mg by mouth as needed for mild pain or headache.   Marland Kitchen apixaban (ELIQUIS) 2.5 MG TABS tablet Take 1 tablet (2.5 mg total) by mouth 2 (two) times daily.  . bisoprolol-hydrochlorothiazide (ZIAC) 5-6.25 MG tablet Take 1 tablet by mouth daily. Okay to take a second tablet if BP more than 140/85  . Cholecalciferol (VITAMIN D3) 2000 units TABS Take 2,000 Units by mouth daily.   . diclofenac Sodium  (VOLTAREN) 1 % GEL Apply 2 g topically 4 (four) times daily.  Marland Kitchen DM-GG & DM-APAP-CPM (CORICIDIN HBP DAY/NIGHT COLD) 10-20 &15-200-2 MG MISC Take 1 tablet by mouth daily as needed (allergies).  Marland Kitchen doxazosin (CARDURA) 4 MG tablet Take 1 tablet (4 mg total) by mouth at bedtime.  . Flaxseed Oil OIL Take 1,400 mg by mouth daily.   . fluticasone (FLONASE) 50 MCG/ACT nasal spray Place 2 sprays into both nostrils daily as needed for allergies or rhinitis.  Marland Kitchen OVER THE COUNTER MEDICATION Take 1 tablet by mouth daily. Prostate Plus Health Complex w/ Cran-Max-Cranberry 1 daily      Allergies:   Hydrocodone, Oxycodone, and Latex   Social History   Socioeconomic History  . Marital status: Married    Spouse name: Not on file  . Number of children: 4  . Years of education: Not on file  . Highest education level: Not on file  Occupational History  . 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 Smoking status: Never Smoker  . Smokeless tobacco: Never Used  Vaping Use  . Vaping Use: Never used  Substance and Sexual Activity  . Alcohol use: Yes    Alcohol/week: 0.0 standard drinks  Comment: 2x per week  . Drug use: No  . Sexual activity: Not Currently  Other Topics Concern  . Not on file  Social History Narrative   Lives w/ wife         Social Determinants of Health   Financial Resource Strain:   . Difficulty of Paying Living Expenses: Not on file  Food Insecurity:   . Worried About Programme researcher, broadcasting/film/video in the Last Year: Not on file  . Ran Out of Food in the Last Year: Not on file  Transportation Needs:   . Lack of Transportation (Medical): Not on file  . Lack of Transportation (Non-Medical): Not on file  Physical Activity:   . Days of Exercise per Week: Not on file  . Minutes of Exercise per Session: Not on file  Stress:   . Feeling of Stress : Not on file  Social Connections:   . Frequency of Communication with  Friends and Family: Not on file  . Frequency of Social Gatherings with Friends and Family: Not on file  . Attends Religious Services: Not on file  . Active Member of Clubs or Organizations: Not on file  . Attends Banker Meetings: Not on file  . Marital Status: Not on file     Family History: The patient's family history includes Breast cancer in his sister; Diabetes in his mother; Heart attack (age of onset: 69) in his father; Lung cancer (age of onset: 35) in his brother; Rectal cancer (age of onset: 68) in his sister. There is no history of Prostate cancer.  ROS:   Please see the history of present illness.    All other systems reviewed and are negative.  EKGs/Labs/Other Studies Reviewed:    The following studies were reviewed today: Zio, echo  05/01/2020 Heart monitor  Sinus rhythm and sinus bradycardia was the underlying rhythm. Heart rate ranged between 47 and 142 bpm.  Frequent premature ventricular ectopic beats occurred, with a burden of 35%.  Rare ventricular bigeminy and trigeminy was noted.  A 4 beat run of ventricular tachycardia occurred.  PACs were rare.  First-degree AV block was noted.  04/11/2020 Echo 1. Left ventricular ejection fraction, by estimation, is 40 to 45% with  beat to beat variability due to ectopy. The left ventricle has mildly  decreased function. The left ventricle demonstrates global hypokinesis.  Left ventricular diastolic parameters  are consistent with Grade II diastolic dysfunction (pseudonormalization).  Elevated left ventricular end-diastolic pressure.  2. Right ventricular systolic function is normal. The right ventricular  size is mildly enlarged. Mildly increased right ventricular wall  thickness. There is normal pulmonary artery systolic pressure.  3. The mitral valve is grossly normal. Mild mitral valve regurgitation.  No evidence of mitral stenosis.  4. The aortic valve is tricuspid. Aortic valve regurgitation  is not  visualized. No aortic stenosis is present.  5. Aortic dilatation noted. 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  6. The inferior vena cava is normal in size with greater than 50%  respiratory variability, suggesting right atrial pressure of 3 mmHg.  7. Left atrial size was mildly dilated.   03/21/2020 ECG  Sinus rhythm with bigeminal PVC. Morphology consistent with outflow tract, likely RVOT>LVOT.  07/21/2019 ECG  Sinus rhythm with bigeminal PVC. Morphology c/w RVOT>LVOT  EKG:  The ekg ordered today demonstrates sinus rhythm with PVC in a bigeminal pattern. PVC appears to arise from the RVOT>LVOT.  Recent Labs: 01/19/2020: ALT 15  03/21/2020: BUN 25; Creatinine, Ser 1.18; Hemoglobin 14.8; Magnesium 2.4; Platelets 196; Potassium 4.2; Sodium 137; TSH 2.114  Recent Lipid Panel    Component Value Date/Time   CHOL 164 02/28/2019 1306   TRIG 209.0 (H) 02/28/2019 1306   HDL 30.30 (L) 02/28/2019 1306   CHOLHDL 5 02/28/2019 1306   VLDL 41.8 (H) 02/28/2019 1306   LDLCALC 89 03/02/2018 0944   LDLDIRECT 113.0 02/28/2019 1306    Physical Exam:    VS:  BP 112/68   Pulse (!) 55   Ht 5\' 7"  (1.702 m)   Wt 192 lb 6.4 oz (87.3 kg)   SpO2 98%   BMI 30.13 kg/m     Wt Readings from Last 3 Encounters:  05/16/20 192 lb 6.4 oz (87.3 kg)  03/28/20 196 lb (88.9 kg)  03/25/20 199 lb 8 oz (90.5 kg)     GEN:  Well nourished, well developed in no acute distress. Obese. HEENT: Normal NECK: No JVD; No carotid bruits LYMPHATICS: No lymphadenopathy CARDIAC: RRR. Pulse rate in the 30s due to nonpulsatile PVCs, no murmurs, rubs, gallops RESPIRATORY:  Clear to auscultation without rales, wheezing or rhonchi  ABDOMEN: Soft, non-tender, non-distended MUSCULOSKELETAL:  No edema; No deformity  SKIN: Warm and dry NEUROLOGIC:  Alert and oriented x 3 PSYCHIATRIC:  Normal affect   ASSESSMENT:    1. PVC (premature ventricular contraction)   2. 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 His PVC is completely asymptomatic. The recent echo shows a possible mild decrease in LV function although on my personal review, I believe this is related primarily to beat to beat variation in measurements with the PVCs. He has no HF symptoms and is able to exercise daily at the gym.   I discussed the options including medical therapy or ablation therapy in detail. The ablation procedure would require venous and arterial mapping to map the aortic cusps. Ablation would likely be required in both the posterior RVOT and LVOT/AVcusp. The risks of the procedure were discussed with the patient including the risks of bleeding, tamponade, stroke. I discussed pharmacologic therapy for the PVC including amiodarone. Given he is completely asymptomatic and is very active, the patient has decided to pursue a watchful waiting strategy. We will plan to see each other in clinic in 6 months to reassess symptoms. If he becomes symptomatic or if there is any clinical evidence of HF before then, we will meet sooner to discuss alternatives.   Medication Adjustments/Labs and Tests Ordered: Current medicines are reviewed at length with the patient today.  Concerns regarding medicines are outlined above.  Orders Placed This Encounter  Procedures  . EKG 12-Lead   No orders of the defined types were placed in this encounter.   Patient Instructions  Medication Instructions:  Your physician recommends that you continue on your current medications as directed. Please refer to the Current Medication list given to you today.  *If you need a refill on your cardiac medications before your next appointment, please call your pharmacy*  Lab Work: None ordered.  If you have labs (blood work) drawn today and your tests are  completely normal, you will receive your results only by: 03/27/20 MyChart Message (if you have MyChart) OR . A paper copy in the mail If you have any lab test that  is abnormal or we need to change your treatment, we will call you to review the results.  Testing/Procedures: None ordered.  Follow-Up: At [facility], you and your health needs are our priority.  As part of our continuing mission to provide you with exceptional heart care, we have created designated Provider Care Teams.  These Care Teams include your primary Cardiologist (physician) and Advanced Practice Providers (APPs -  Physician Assistants and Nurse Practitioners) who all work together to provide you with the care you need, when you need it.  We recommend signing up for the patient portal called "MyChart".  Sign up information is provided on this After Visit Summary.  MyChart is used to connect with patients for Virtual Visits (Telemedicine).  Patients are able to view lab/test results, encounter notes, upcoming appointments, etc.  Non-urgent messages can be sent to your provider as well.   To learn more about what you can do with MyChart, go to [email or web address].    Your next appointment:   Your physician wants you to follow-up in: 11/06/19/ at 1:45 with Dr. Lalla BrothersLambert.    Other Instructions:     Signed, Steffanie Dunnameron Dashay Giesler, MD, [facility]  05/16/2020 1:28 PM    Electrophysiology [facility]

## 2020-05-16 ENCOUNTER — Ambulatory Visit: Payer: Medicare Other | Admitting: Cardiology

## 2020-05-16 ENCOUNTER — Encounter: Payer: Self-pay | Admitting: Cardiology

## 2020-05-16 ENCOUNTER — Other Ambulatory Visit: Payer: Self-pay

## 2020-05-16 VITALS — BP 112/68 | HR 55 | Ht 67.0 in | Wt 192.4 lb

## 2020-05-16 DIAGNOSIS — I493 Ventricular premature depolarization: Secondary | ICD-10-CM

## 2020-05-16 DIAGNOSIS — I5042 Chronic combined systolic (congestive) and diastolic (congestive) heart failure: Secondary | ICD-10-CM | POA: Diagnosis not present

## 2020-05-16 NOTE — Patient Instructions (Addendum)
Medication Instructions:  Your physician recommends that you continue on your current medications as directed. Please refer to the Current Medication list given to you today.  *If you need a refill on your cardiac medications before your next appointment, please call your pharmacy*  Lab Work: None ordered.  If you have labs (blood work) drawn today and your tests are completely normal, you will receive your results only by: Marland Kitchen MyChart Message (if you have MyChart) OR . A paper copy in the mail If you have any lab test that is abnormal or we need to change your treatment, we will call you to review the results.  Testing/Procedures: None ordered.  Follow-Up: At [facility], you and your health needs are our priority.  As part of our continuing mission to provide you with exceptional heart care, we have created designated Provider Care Teams.  These Care Teams include your primary Cardiologist (physician) and Advanced Practice Providers (APPs -  Physician Assistants and Nurse Practitioners) who all work together to provide you with the care you need, when you need it.  We recommend signing up for the patient portal called "MyChart".  Sign up information is provided on this After Visit Summary.  MyChart is used to connect with patients for Virtual Visits (Telemedicine).  Patients are able to view lab/test results, encounter notes, upcoming appointments, etc.  Non-urgent messages can be sent to your provider as well.   To learn more about what you can do with MyChart, go to [email or web address].    Your next appointment:   Your physician wants you to follow-up in: 11/06/19/ at 1:45 with Dr. Lalla Brothers.    Other Instructions:

## 2020-05-19 DIAGNOSIS — Z20822 Contact with and (suspected) exposure to covid-19: Secondary | ICD-10-CM | POA: Diagnosis not present

## 2020-06-03 IMAGING — DX DG LUMBAR SPINE COMPLETE 4+V
5 series · 5 of 5 positions shown · non-contrast
Comparison: None.

CLINICAL DATA: Back pain with sudden onset 6 weeks prior radiating
to right hip

EXAM:
LUMBAR SPINE - COMPLETE 4+ VIEW

[l-spine ap]
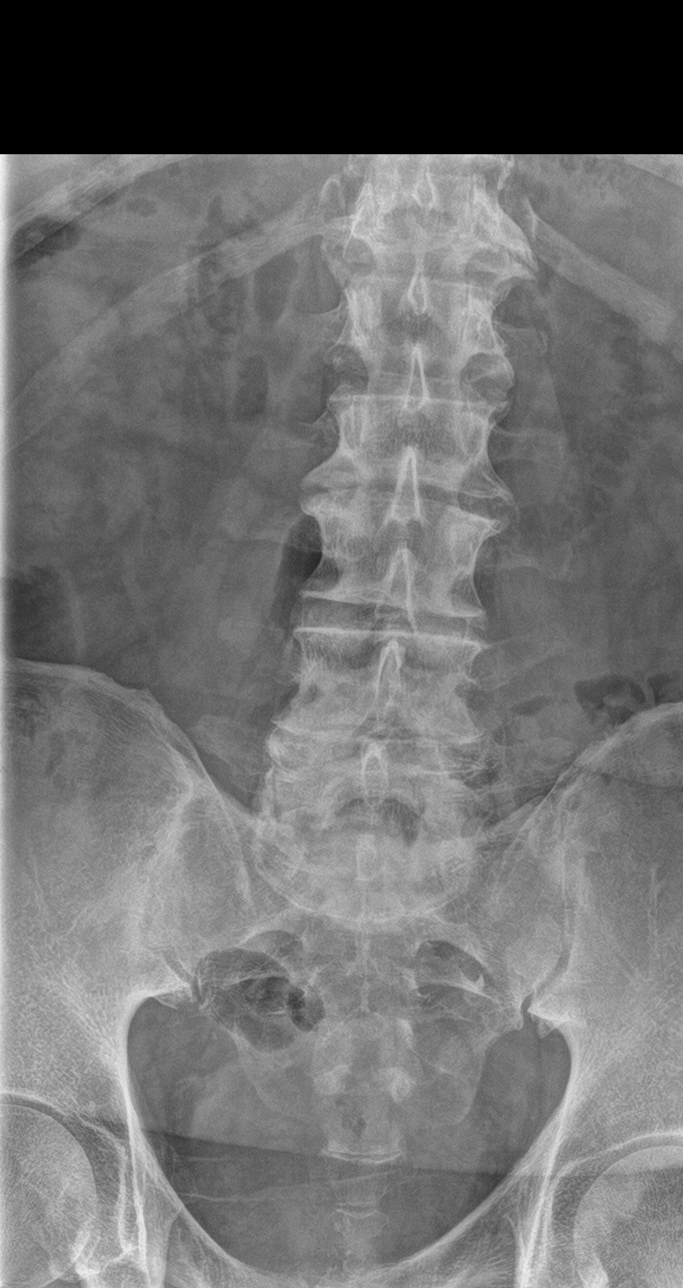

[l-spine obl (1 of 2)]
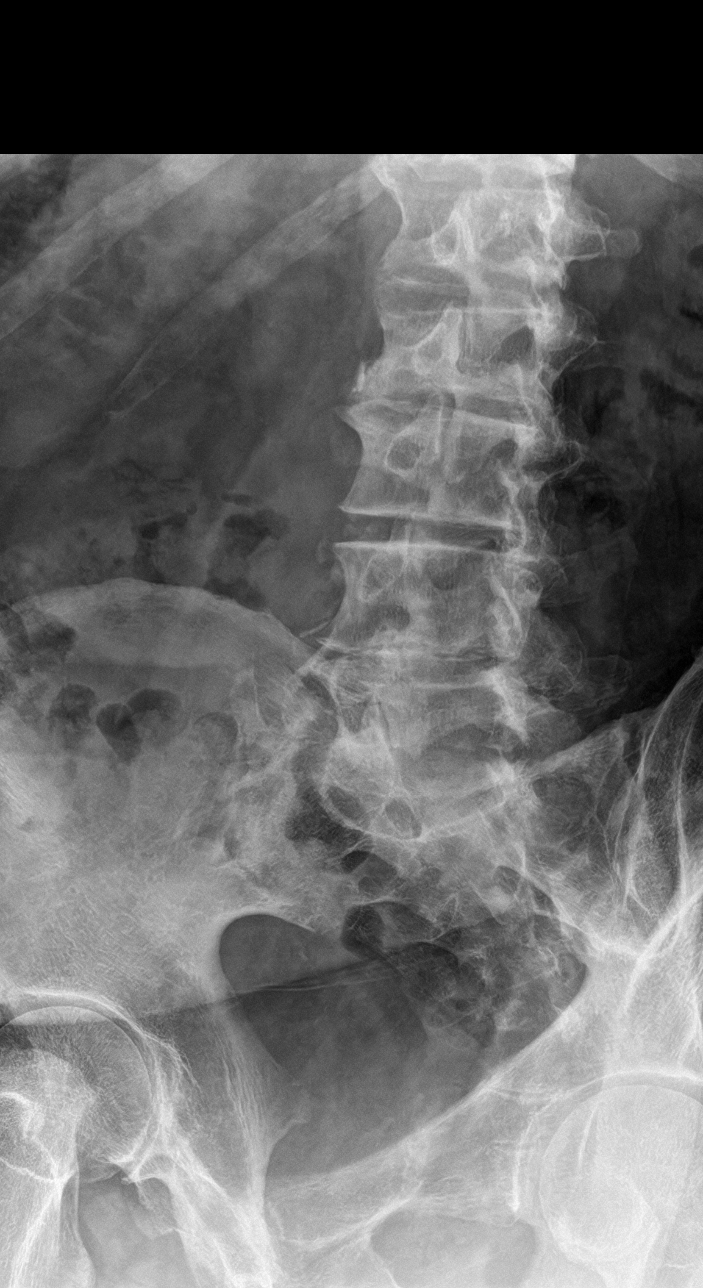

[l-spine obl (2 of 2)]
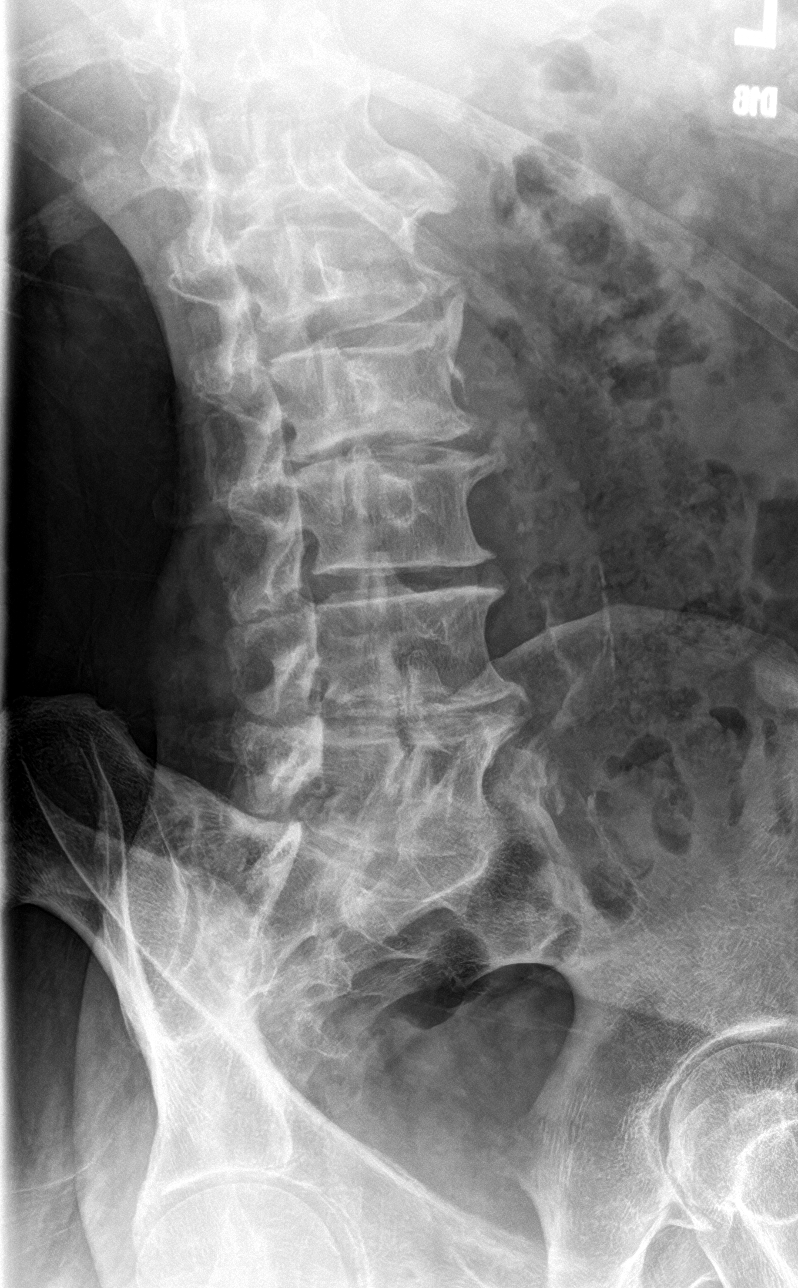

[l-spine lat]
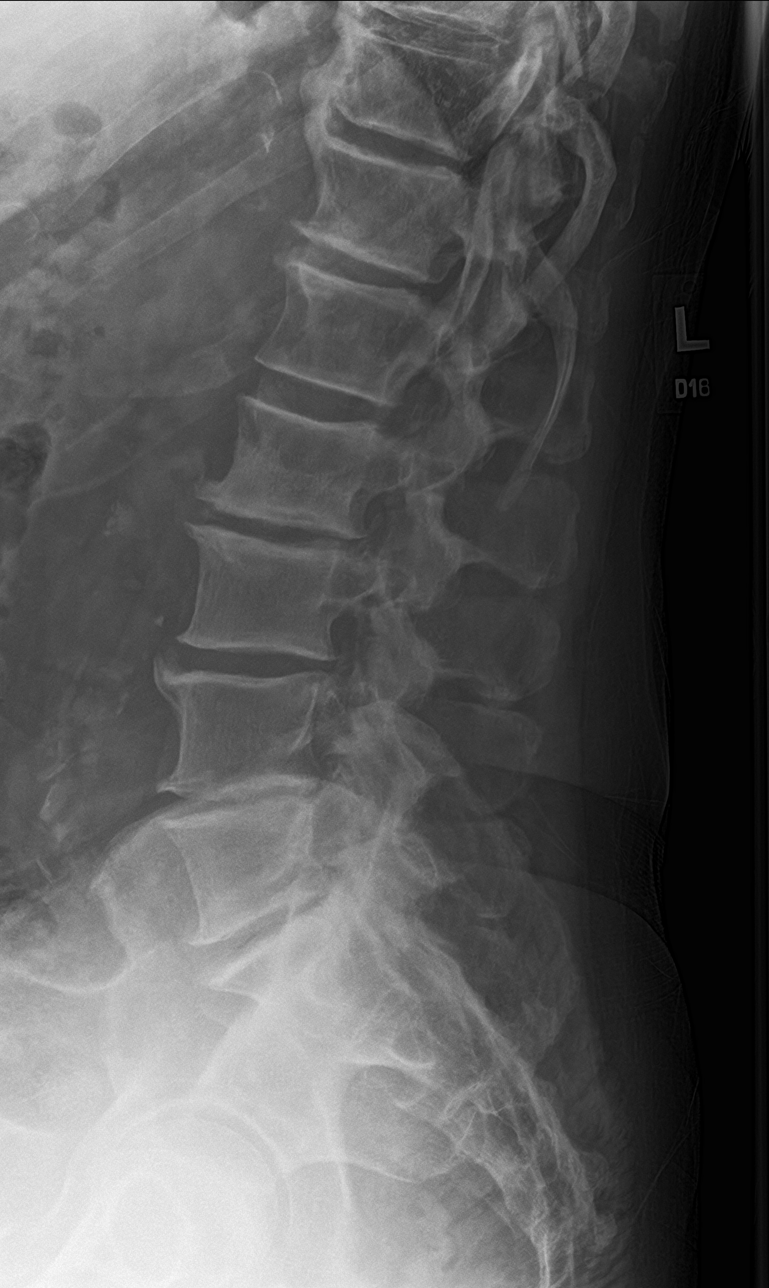

[l-spine spot]
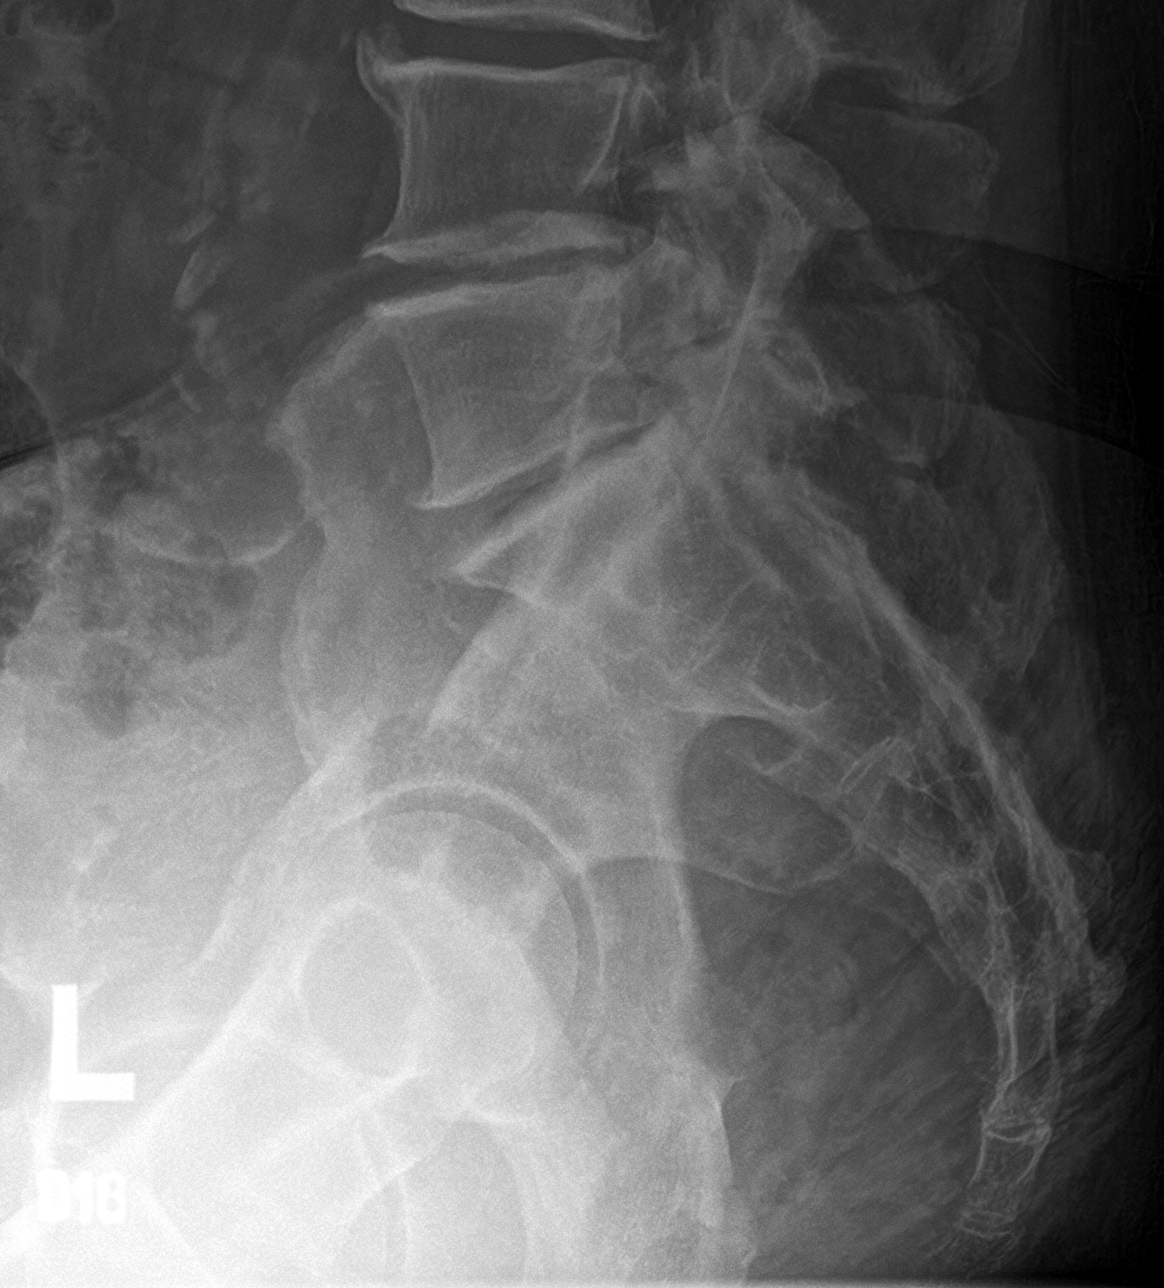

[5 of 5 positions shown; findings below may reference images not displayed]

FINDINGS: There are 5 lumbar type vertebral bodies. No acute fracture
vertebral body height loss. No spondylolysis or spondylolisthesis.
Multilevel discogenic and facet degenerative changes are noted most
prevalent at L2-3, L4-5 and L5-S1. Minimal interspinous arthrosis is
present as well compatible with Baastrup's disease. Aortic
atherosclerosis is noted. Remaining soft tissues are unremarkable.
IMPRESSION: No acute fracture or vertebral body height loss.

Multilevel discogenic and facet degenerative changes most prevalent
at L2-3, L4-5 and L5-S1.

## 2020-06-12 ENCOUNTER — Encounter: Payer: Self-pay | Admitting: Internal Medicine

## 2020-06-12 ENCOUNTER — Other Ambulatory Visit: Payer: Self-pay

## 2020-06-12 ENCOUNTER — Ambulatory Visit (INDEPENDENT_AMBULATORY_CARE_PROVIDER_SITE_OTHER): Payer: Medicare Other | Admitting: Internal Medicine

## 2020-06-12 VITALS — BP 116/70 | HR 73 | Temp 97.8°F | Resp 18 | Ht 67.0 in | Wt 191.4 lb

## 2020-06-12 DIAGNOSIS — I498 Other specified cardiac arrhythmias: Secondary | ICD-10-CM

## 2020-06-12 DIAGNOSIS — I10 Essential (primary) hypertension: Secondary | ICD-10-CM | POA: Diagnosis not present

## 2020-06-12 DIAGNOSIS — Z23 Encounter for immunization: Secondary | ICD-10-CM | POA: Diagnosis not present

## 2020-06-12 NOTE — Patient Instructions (Signed)
Check the  blood pressure  BP GOAL is between 110/65 and  135/85. If it is consistently higher or lower, let me know     GO TO THE FRONT DESK, PLEASE SCHEDULE YOUR APPOINTMENTS Come back for a physical exam, fasting in 4 months

## 2020-06-12 NOTE — Assessment & Plan Note (Signed)
HTN: BP is very good, continue Ziac, Cardura. Bigeminy Since the last visit, has seen cardiology.  DX monomorphic PVCs, after careful discussion he decided for  watchful waiting Preventive care: Flu shot today, plans to get a Covid booster July 12, 2020 RTC 4 months CPX fasting.

## 2020-06-12 NOTE — Progress Notes (Signed)
Subjective:    Patient ID: Timothy Harry., male    DOB: [birth year], 79 y.o.   MRN: [id number]  DOS:  06/12/2020 Type of visit - description: Routine checkup Since the last office visit he is doing well, he remains active. Denies chest pain, difficulty breathing or palpitations Saw cardiology, notes reviewed.  Review of Systems See above   Past Medical History:  Diagnosis Date  . Actinic keratosis    hyperplastic aktinic keratosis forehead s/p surgery Dr Park Liter  . BPH (benign prostatic hyperplasia)    s/p TUNA  . Chronic cystitis    Dr. Liliane Shi  . DVT (deep venous thrombosis) (HCC) 2005   no know triggers per pt  . HTN (hypertension)    dx in the 70s  . OSA (obstructive sleep apnea)    on CPAP  . Recurrent UTI   . Umbilical hernia    no surgery  . Vitamin D deficiency     Past Surgical History:  Procedure Laterality Date  . PROSTATE SURGERY  2003   TUNA around 2003    Allergies as of 06/12/2020      Reactions   Hydrocodone Shortness Of Breath   Oxycodone    Latex Hives      Medication List       Accurate as of June 12, 2020  1:03 PM. If you have any questions, ask your nurse or doctor.        acetaminophen 500 MG tablet Commonly known as: TYLENOL Take 500 mg by mouth as needed for mild pain or headache.   apixaban 2.5 MG Tabs tablet Commonly known as: Eliquis Take 1 tablet (2.5 mg total) by mouth 2 (two) times daily.   bisoprolol-hydrochlorothiazide 5-6.25 MG tablet Commonly known as: ZIAC Take 1 tablet by mouth daily. Okay to take a second tablet if BP more than 140/85   Coricidin HBP Day/Night Cold 10-20 &15-200-2 MG Misc Generic drug: DM-GG & DM-APAP-CPM Take 1 tablet by mouth daily as needed (allergies).   diclofenac Sodium 1 % Gel Commonly known as: VOLTAREN Apply 2 g topically 4 (four) times daily.   doxazosin 4 MG tablet Commonly known as: CARDURA Take 1 tablet (4 mg total) by mouth at bedtime.   Flaxseed Oil Oil Take 1,400  mg by mouth daily.   fluticasone 50 MCG/ACT nasal spray Commonly known as: FLONASE Place 2 sprays into both nostrils daily as needed for allergies or rhinitis.   OVER THE COUNTER MEDICATION Take 1 tablet by mouth daily. Prostate Plus Health Complex w/ Cran-Max-Cranberry 1 daily   Vitamin D3 50 MCG (2000 UT) Tabs Take 2,000 Units by mouth daily.          Objective:   Physical Exam BP 116/70 (BP Location: Left Arm, Patient Position: Sitting, Cuff Size: Normal)   Pulse 73   Temp 97.8 F (36.6 C) (Oral)   Resp 18   Ht 5\' 7"  (1.702 m)   Wt 191 lb 6 oz (86.8 kg)   SpO2 98%   BMI 29.97 kg/m  General:   Well developed, NAD, BMI noted. HEENT:  Normocephalic . Face symmetric, atraumatic Lungs:  CTA B Normal respiratory effort, no intercostal retractions, no accessory muscle use. Heart: No murmur.  Heart rate in the 30s. Lower extremities: no pretibial edema bilaterally  Skin: Not pale. Not jaundice Neurologic:  alert & oriented X3.  Speech normal, gait appropriate for age and unassisted Psych--  Cognition and judgment appear intact.  Cooperative with normal attention span  and concentration.  Behavior appropriate. No anxious or depressed appearing.      Assessment      Assessment: Prediabetes HTN Bigeminy: Saw cardiology 2019, Holter: 80% QRS with PVCs.  Echo was done, see report BPH, recurrent UTIs, chronic cystitis OSA, on CPAP Umbilical hernia Actinic keratosis; no derm eval recently as off 02/2018 Tinnitus  H/o vitamin D deficiency H/o R leg  DVT, 2005 and 2020, unprovoked   PLAN: HTN: BP is very good, continue Ziac, Cardura. Bigeminy Since the last visit, has seen cardiology.  DX monomorphic PVCs, after careful discussion he decided for  watchful waiting Preventive care: Flu shot today, plans to get a Covid booster July 12, 2020 RTC 4 months CPX fasting.   This visit occurred during the SARS-CoV-2 public health emergency.  Safety protocols were in  place, including screening questions prior to the visit, additional usage of staff PPE, and extensive cleaning of exam room while observing appropriate contact time as indicated for disinfecting solutions.

## 2020-06-12 NOTE — Progress Notes (Signed)
Pre visit review using our clinic review tool, if applicable. No additional management support is needed unless otherwise documented below in the visit note. 

## 2020-06-20 ENCOUNTER — Other Ambulatory Visit: Payer: Self-pay

## 2020-06-20 DIAGNOSIS — E781 Pure hyperglyceridemia: Secondary | ICD-10-CM

## 2020-06-20 DIAGNOSIS — R739 Hyperglycemia, unspecified: Secondary | ICD-10-CM

## 2020-06-20 DIAGNOSIS — I10 Essential (primary) hypertension: Secondary | ICD-10-CM

## 2020-06-25 ENCOUNTER — Telehealth: Payer: Self-pay | Admitting: Pharmacist

## 2020-06-25 NOTE — Progress Notes (Addendum)
    Chronic Care Management Pharmacy Assistant   Name: Timothy Stone.  MRN: [id number] DOB: [birth year]  Reason for Encounter: Medication Review/Initial Questions for appointment scheduled with Pharmacist on 06-27-20.  PCP : Wanda Plump, MD  Allergies:   Allergies  Allergen Reactions  . Hydrocodone Shortness Of Breath  . Oxycodone   . Latex Hives    Medications: Outpatient Encounter Medications as of 06/25/2020  Medication Sig  . acetaminophen (TYLENOL) 500 MG tablet Take 500 mg by mouth as needed for mild pain or headache.   Marland Kitchen apixaban (ELIQUIS) 2.5 MG TABS tablet Take 1 tablet (2.5 mg total) by mouth 2 (two) times daily.  . bisoprolol-hydrochlorothiazide (ZIAC) 5-6.25 MG tablet Take 1 tablet by mouth daily. Okay to take a second tablet if BP more than 140/85  . Cholecalciferol (VITAMIN D3) 2000 units TABS Take 2,000 Units by mouth daily.   . diclofenac Sodium (VOLTAREN) 1 % GEL Apply 2 g topically 4 (four) times daily.  Marland Kitchen doxazosin (CARDURA) 4 MG tablet Take 1 tablet (4 mg total) by mouth at bedtime.  . Flaxseed Oil OIL Take 1,400 mg by mouth daily.   . fluticasone (FLONASE) 50 MCG/ACT nasal spray Place 2 sprays into both nostrils daily as needed for allergies or rhinitis.  Marland Kitchen OVER THE COUNTER MEDICATION Take 1 tablet by mouth daily. Prostate Plus Health Complex w/ Cran-Max-Cranberry 1 daily    No facility-administered encounter medications on file as of 06/25/2020.    Current Diagnosis: Patient Active Problem List   Diagnosis Date Noted  . DVT (deep venous thrombosis) (HCC) 08/21/2019  . PVC (premature ventricular contraction) 06/29/2018  . PCP NOTES >>>>>>>>>>>>>>>>>>>> 02/12/2016  . Umbilical hernia 11/01/2012  . Hyperglycemia 01/20/2012  . Annual physical exam 08/10/2011  . BPH (benign prostatic hyperplasia)   . Recurrent UTI   . HYPERTRIGLYCERIDEMIA 11/16/2008  . Obstructive sleep apnea 03/07/2008  . Essential hypertension 02/21/2007  . ALLERGIC RHINITIS  02/21/2007    Goals Addressed   None    Have you seen any other providers since your last visit? No Any changes in your medications or health? Patient states his eliquis is very expensive and put him in the doughnut.  Any side effects from any medications? Patient states only side effects he has is only he is getting old Do you have an symptoms or problems not managed by your medications? No Any concerns about your health right now? Patient states his eliquis is very expensive and put him in the doughnut.    Has your provider asked that you check blood pressure, blood sugar, or follow special diet at home? 127/78 before medication on 06-26-20. Do you get any type of exercise on a regular basis? Goes to gym every day 45-50 minutes Can you think of a goal you would like to reach for your health? States he would like to keep breathing and lose weight.   Do you have any problems getting your medications? No Is there anything that you would like to discuss during the appointment?  Patient states his eliquis is very expensive and put him in the doughnut.    Advised please have all medications and supplements for televisit appointment.  Follow-Up:  Pharmacist Review   Armenia Shannon, RMA [place] [facility] Clinical Pharmacist Assistant [phone]  Reviewed by: Katrinka Blazing, PharmD Clinical Pharmacist [place] [facility] [phone]

## 2020-06-27 ENCOUNTER — Ambulatory Visit: Payer: Medicare Other | Admitting: Pharmacist

## 2020-06-27 ENCOUNTER — Other Ambulatory Visit: Payer: Self-pay

## 2020-06-27 DIAGNOSIS — E781 Pure hyperglyceridemia: Secondary | ICD-10-CM

## 2020-06-27 DIAGNOSIS — I10 Essential (primary) hypertension: Secondary | ICD-10-CM

## 2020-06-27 DIAGNOSIS — R739 Hyperglycemia, unspecified: Secondary | ICD-10-CM

## 2020-06-27 NOTE — Chronic Care Management (AMB) (Signed)
Chronic Care Management Pharmacy  Name: Timothy Stone.  MRN: [id number] DOB: 1940-09-11  Chief Complaint/ HPI  Timothy Garter.,  79 y.o. , male presents for their Initial CCM visit with the clinical pharmacist via telephone due to COVID-19 Pandemic.  PCP : Colon Branch, MD  Their chronic conditions include: Hypertension, Hyperlipidemia, Pre-Diabetes, Hx of DVT, BPH  Office Visits: 06/12/20: Visit w/ Dr. 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 No med changes noted  03/25/20: Visit w/ Dr. Larose Kells - Referral to cardio for bigeminy  Consult Visit: 05/16/20: Cardio visit w/ Dr. Quentin Ore - Discussion of options for monomorphic PVCs. Plan for watchful waiting due to no symptoms and ability to exercise. RTC 6 months or sooner if symptoms develop.  01/19/20: Heme/Onc visit w/ Dr. Maylon Peppers - Unprovoked, RLE DVT. Pt completed at least 6 months at full dose, dose reduced to Eliquis 2.75m BID for secondary ppx.  ED Visit:  [place]21/[place].  Medications: Outpatient Encounter Medications as of 06/27/2020  Medication Sig  . acetaminophen (TYLENOL) 500 MG tablet Take 500 mg by mouth as needed for mild pain or headache.   .Marland Kitchenapixaban (ELIQUIS) 2.5 MG TABS tablet Take 1 tablet (2.5 mg total) by mouth 2 (two) times daily.  . bisoprolol-hydrochlorothiazide (ZIAC) 5-6.25 MG tablet Take 1 tablet by mouth daily. Okay to take a second tablet if BP more than 140/85  . Cholecalciferol (VITAMIN D3) 2000 units TABS Take 2,000 Units by mouth daily.   . diclofenac Sodium (VOLTAREN) 1 % GEL Apply 2 g topically 4 (four) times daily.  .Marland Kitchendoxazosin (CARDURA) 4 MG tablet Take 1 tablet (4 mg total) by mouth at bedtime.  . Flaxseed Oil OIL Take 1,400 mg by mouth daily.   . fluticasone (FLONASE) 50 MCG/ACT nasal spray Place 2 sprays into both nostrils daily as needed for allergies or rhinitis.  .Marland KitchenOVER THE  COUNTER MEDICATION Take 1 tablet by mouth daily. Prostate Plus Health Complex w/ Cran-Max-Cranberry 1 daily    No facility-administered encounter medications on file as of 06/27/2020.   SDOH Screenings   Alcohol Screen:   . Last Alcohol Screening Score (AUDIT): Not on file  Depression (PHQ2-9): Low Risk   . PHQ-2 Score: 0  Financial Resource Strain:   . Difficulty of Paying Living Expenses: Not on file  Food Insecurity:   . Worried About RCharity fundraiserin the Last Year: Not on file  . Ran Out of Food in the Last Year: Not on file  Housing:   . Last Housing Risk Score: Not on file  Physical Activity: Sufficiently Active  . Days of Exercise per Week: 7 days  . Minutes of Exercise per Session: 60 min  Social Connections:   . Frequency of Communication with Friends and Family: Not on file  . Frequency of Social Gatherings with Friends and Family: Not on file  . Attends Religious Services: Not on file  . Active Member of Clubs or Organizations: Not on file  . Attends CArchivistMeetings: Not on file  . Marital Status: Not on file  Stress:   . Feeling of Stress : Not on file  Tobacco Use: Low Risk   . Smoking Tobacco Use: Never Smoker  . Smokeless Tobacco Use: Never Used  Transportation Needs:   . LFilm/video editor(Medical): Not on file  . Lack of Transportation (  Non-Medical): Not on file    Current Diagnosis/Assessment:  Goals Addressed            This Visit's Progress   . Chronic Care Management Pharmacy Care Plan       CARE PLAN ENTRY (see longitudinal plan of care for additional care plan information)  Current Barriers:  . Chronic Disease Management support, education, and care coordination needs related to Hypertension, Hyperlipidemia, Pre-Diabetes, Hx of DVT, BPH   Hypertension BP Readings from Last 3 Encounters:  06/12/20 116/70  05/16/20 112/68  03/28/20 130/60   . Pharmacist Clinical Goal(s): o Over the next 180 days, patient will work  with PharmD and providers to maintain BP goal <140/90 . Current regimen:  o Bisoprolol-hctc 5-6.25mg daily . Interventions: o Discussed BP goal . Patient self care activities - Over the next 180 days, patient will: o Maintain hypertension medication regimen.   Hyperlipidemia Lab Results  Component Value Date/Time   LDLCALC 89 03/02/2018 09:44 AM   LDLDIRECT 113.0 02/28/2019 01:06 PM   . Pharmacist Clinical Goal(s): o Over the next 180 days, patient will work with PharmD and providers to achieve LDL goal < 100 or prevent LDL from increaseing . Current regimen:  o Flaxseed Oil 1429m daily . Interventions: o Discussed LDL goal . Patient self care activities - Over the next 180 days, patient will: o Maintain cholesterol medication regimen.   Pre-Diabetes Lab Results  Component Value Date/Time   HGBA1C 6.2 11/21/2019 01:29 PM   HGBA1C 6.5 05/24/2019 08:59 AM   . Pharmacist Clinical Goal(s): o Over the next 180 days, patient will work with PharmD and providers to maintain A1c goal <6.5% . Current regimen:  o Diet and exercise management   . Interventions: o Discussed a1c goal . Patient self care activities - Over the next 180 days, patient will: o Maintain a1c <6.5%  Health Maintenance  . Pharmacist Clinical Goal(s) o Over the next 180 days, patient will work with PharmD and providers to complete health maintenance screenings/vaccinations . Interventions: o Discussed Shingrix Vaccine series . Patient self care activities - Over the next 180 days, patient will: o Complete Shingrix vaccine series  Medication management . Pharmacist Clinical Goal(s): o Over the next 180 days, patient will work with PharmD and providers to maintain optimal medication adherence . Current pharmacy: [facility]. Interventions o Comprehensive medication review performed. o Continue current medication management strategy . Patient self care activities - Over the next 180 days, patient  will: o Focus on medication adherence by filling and taking medications appropriately o  o Take medications as prescribed o Report any questions or concerns to PharmD and/or provider(s)  Initial goal documentation       Social Hx:  Married 40+ years.  Have one son together. Wife has 1 child prior to current marriage. Patient has 3 children prior to current marriage. Retired from [employer] Hypertension   BP goal is:  <140/90  Office blood pressures are  BP Readings from Last 3 Encounters:  06/12/20 116/70  05/16/20 112/68  03/28/20 130/60   Patient checks BP at home twice daily Patient home BP readings are ranging:  _0 128 67 127 78 118 63 Avg 124.5 68.875  Patient has failed these meds in the past: None noted  Patient is currently controlled on the following medications:  . Bisoprolol-hctc 5-6.25mg daily AM ok to take second tab if BP more than 140/85  We  discussed BP goal  Plan -Continue current medications     Hyperlipidemia   LDL goal < 100  Lipid Panel     Component Value Date/Time   CHOL 164 02/28/2019 1306   TRIG 209.0 (H) 02/28/2019 1306   HDL 30.30 (L) 02/28/2019 1306   LDLCALC 89 03/02/2018 0944   LDLDIRECT 113.0 02/28/2019 1306    Hepatic Function Latest Ref Rng & Units 01/19/2020 08/22/2019 05/24/2019  Total Protein 6.5 - 8.1 g/dL 7.1 7.5 7.2  Albumin 3.5 - 5.0 g/dL 4.4 4.7 4.4  AST 15 - 41 U/L 16 14(L) 16  ALT 0 - 44 U/L _0 Alk Phosphatase 38 - 126 U/L 56 53 53  Total Bilirubin 0.3 - 1.2 mg/dL 0.6 0.8 0.8  Bilirubin, Direct 0.0 - 0.3 mg/dL - - -     The ASCVD Risk score ([place]., et al., 2013) failed to calculate for the following reasons:   The 2013 ASCVD risk score is only valid for ages 27 to 33   Patient has failed these meds in past: None noted  Patient is currently uncontrolled on the following medications:  . Flaxseed Oil 145m daily 3pm  He has been offered statins  previously, but would like to manage with diet and exercise. We discussed:  LDL goal. Noting pt's age, no recommendation to start statin for primary prevention  Plan -Continue control with diet and exercise  Pre-Diabetes   A1c goal <6.5%  Recent Relevant Labs: Lab Results  Component Value Date/Time   HGBA1C 6.2 11/21/2019 01:29 PM   HGBA1C 6.5 05/24/2019 08:59 AM   GFR 50.13 (L) 05/24/2019 08:59 AM   GFR 54.74 (L) 02/28/2019 01:06 PM    Patient has failed these meds in past: None noted  Patient is currently controlled on the following medications: . None  Exercise Goes to gym daily for about an hour. States   Diet Eats a lot salads B - Protein shake with fruits after the gym L - Nuts, sandwich, soup D - Fish; chicken; salad Snack - Sometimes (chips) Drinks - diet soft drink, water (64 oz/[place])  We discussed: diet and exercise extensively  Plan -Continue control with diet and exercise  Hx of DVT    Patient has failed these meds in past: None noted  Patient is currently controlled on the following medications: . Eliquis 2.566mtwice daily 8AM and 8PM  Wishes he could stop taking eliquis due to cost. He can afford the cost, but feels it's expensive. Drives him into the donut whole.  Plan -Continue current medications   BPH   Followed by Urology (Dr. WiLovena Neighbours PSA  Date Value Ref Range Status  05/29/2015 2.36  Final     Patient has failed these meds in past: None noted  Patient is currently controlled on the following medications:  . Doxazosin 90m11maily at bedtime . Prostate Plus Health Complex AM  Overnight Urination: couple of times over the last couple of month Stream vs Dribble: Stream Bladder Emptying: Feels he empties, but emptying studies show differently  Plan -Continue current medications   Vaccines   Reviewed and discussed patient's vaccination history.    Immunization History  Administered Date(s) Administered  . Fluad Quad(high Dose  65+) 05/24/2019, 06/12/2020  . Influenza Split 06/07/2014  . Influenza Whole 07/28/2007, 05/26/2010  . Influenza, High Dose Seasonal PF 07/02/2015, 05/27/2016, 06/17/2017, 06/16/2018  . PFIZER SARS-COV-2 Vaccination 09/22/2019, 10/13/2019  . Pneumococcal Conjugate-13 02/16/2014  . Pneumococcal Polysaccharide-23 05/18/2008, 03/02/2018  .  Td 09/08/2003  . Tetanus 02/16/2014  . Zoster 05/26/2010    Plan -Recommended patient receive Shingrix vaccine in pharmacy.   Medication Management   Pt uses [facility] mail order pharmacy for all medications Uses pill box? No - uses an alarm for PM meds Pt endorses missing only one dose in last year  Miscellaneous Meds Acetaminophen 565m as needed Vitamin D 2000 units daily 3pm Diclofenac 1% gel Fluticasone 574m  We discussed: Current pharmacy is preferred with insurance plan and patient is satisfied with pharmacy services  Plan  Continue current medication management strategy   Follow up:  3 month check in  6 month phone visit  KaDe BlanchPharmD Clinical Pharmacist [phone]

## 2020-06-27 NOTE — Patient Instructions (Addendum)
Visit Information  Goals Addressed            This Visit's Progress   . Chronic Care Management Pharmacy Care Plan       CARE PLAN ENTRY (see longitudinal plan of care for additional care plan information)  Current Barriers:  . Chronic Disease Management support, education, and care coordination needs related to Hypertension, Hyperlipidemia, Pre-Diabetes, Hx of DVT, BPH   Hypertension BP Readings from Last 3 Encounters:  06/12/20 116/70  05/16/20 112/68  03/28/20 130/60   . Pharmacist Clinical Goal(s): o Over the next 180 days, patient will work with PharmD and providers to maintain BP goal <140/90 . Current regimen:  o Bisoprolol-hctc 5-6.25mg  daily . Interventions: o Discussed BP goal . Patient self care activities - Over the next 180 days, patient will: o Maintain hypertension medication regimen.   Hyperlipidemia Lab Results  Component Value Date/Time   LDLCALC 89 03/02/2018 09:44 AM   LDLDIRECT 113.0 02/28/2019 01:06 PM   . Pharmacist Clinical Goal(s): o Over the next 180 days, patient will work with PharmD and providers to achieve LDL goal < 100 or prevent LDL from increaseing . Current regimen:  o Flaxseed Oil 1400mg  daily . Interventions: o Discussed LDL goal . Patient self care activities - Over the next 180 days, patient will: o Maintain cholesterol medication regimen.   Pre-Diabetes Lab Results  Component Value Date/Time   HGBA1C 6.2 11/21/2019 01:29 PM   HGBA1C 6.5 05/24/2019 08:59 AM   . Pharmacist Clinical Goal(s): o Over the next 180 days, patient will work with PharmD and providers to maintain A1c goal <6.5% . Current regimen:  o Diet and exercise management   . Interventions: o Discussed a1c goal . Patient self care activities - Over the next 180 days, patient will: o Maintain a1c <6.5%  Health Maintenance  . Pharmacist Clinical Goal(s) o Over the next 180 days, patient will work with PharmD and providers to complete health maintenance  screenings/vaccinations . Interventions: o Discussed Shingrix Vaccine series . Patient self care activities - Over the next 180 days, patient will: o Complete Shingrix vaccine series  Medication management . Pharmacist Clinical Goal(s): o Over the next 180 days, patient will work with PharmD and providers to maintain optimal medication adherence . Current pharmacy: 05/26/2019 . Interventions o Comprehensive medication review performed. o Continue current medication management strategy . Patient self care activities - Over the next 180 days, patient will: o Focus on medication adherence by filling and taking medications appropriately o  o Take medications as prescribed o Report any questions or concerns to PharmD and/or provider(s)  Initial goal documentation        Timothy Stone was given information about Chronic Care Management services today including:  1. CCM service includes personalized support from designated clinical staff supervised by his physician, including individualized plan of care and coordination with other care providers 2. 24/7 contact phone numbers for assistance for urgent and routine care needs. 3. Standard insurance, coinsurance, copays and deductibles apply for chronic care management only during months in which we provide at least 20 minutes of these services. Most insurances cover these services at 100%, however patients may be responsible for any copay, coinsurance and/or deductible if applicable. This service may help you avoid the need for more expensive face-to-face services. 4. Only one practitioner may furnish and bill the service in a calendar month. 5. The patient may stop CCM services at any time (effective at the end of the  month) by phone call to the office staff.  Patient agreed to services and verbal consent obtained.   The patient verbalized understanding of instructions provided today and agreed to receive a mailed copy of patient  instruction and/or educational materials. Telephone follow up appointment with pharmacy team member scheduled for: 12/26/20  Timothy Stone Timothy Stone, PharmD Clinical Pharmacist [facility] [phone]   Preventing Diabetes Mellitus Complications You can take action to prevent or slow down problems that are caused by diabetes (diabetes mellitus). Following your diabetes plan and taking care of yourself can reduce your risk of serious or life-threatening complications. What actions can I take to prevent diabetes complications? Manage your diabetes   Follow instructions from your health care providers about managing your diabetes. Your diabetes may be managed by a team of health care providers who can teach you how to care for yourself and can answer questions that you have.  Educate yourself about your condition so you can make healthy choices about eating and physical activity.  Check your blood sugar (glucose) levels as often as directed. Your health care provider will help you decide how often to check your blood glucose level depending on your treatment goals and how well you are meeting them.  Ask your health care provider if you should take low-dose aspirin daily and what dose is recommended for you. Taking low-dose aspirin daily is recommended to help prevent cardiovascular disease. Do not use nicotine or tobacco Do not use any products that contain nicotine or tobacco, such as cigarettes and e-cigarettes. If you need help quitting, ask your health care provider. Nicotine raises your risk for diabetes problems. If you quit using nicotine:  You will lower your risk for heart attack, stroke, nerve disease, and kidney disease.  Your cholesterol and blood pressure may improve.  Your blood circulation will improve. Keep your blood pressure under control Your personal target blood pressure is determined based on:  Your age.  Your medicines.  How long you have  had diabetes.  Any other medical conditions you have. To control your blood pressure:  Follow instructions from your health care provider about meal planning, exercise, and medicines.  Make sure your health care provider checks your blood pressure at every medical visit.  Monitor your blood pressure at home as told by your health care provider.  Keep your cholesterol under control To control your cholesterol:  Follow instructions from your health care provider about meal planning, exercise, and medicines.  Have your cholesterol checked at least once a year.  You may be prescribed medicine to lower cholesterol (statin). If you are not taking a statin, ask your health care provider if you should be. Controlling your cholesterol may:  Help prevent heart disease and stroke. These are the most common health problems for people with diabetes.  Improve your blood flow. Schedule and keep yearly physical exams and eye exams Your health care provider will tell you how often you need medical visits depending on your diabetes management plan. Keep all follow-up visits as directed. This is important so possible problems can be identified early and complications can be avoided or treated.  Every visit with your health care provider should include measuring your: ? Weight. ? Blood pressure. ? Blood glucose control.  Your A1c (hemoglobin A1c) level should be checked: ? At least 2 times a year, if you are meeting your treatment goals. ? 4 times a year, if you are not meeting treatment goals or if your  treatment goals have changed.  Your blood lipids (lipid profile) should be checked yearly. You should also be checked yearly for protein in your urine (urine microalbumin).  If you have type 1 diabetes, get an eye exam 3-5 years after you are diagnosed, and then once a year after your first exam.  If you have type 2 diabetes, get an eye exam as soon as you are diagnosed, and then once a year  after your first exam. Keep your vaccines current It is recommended that you receive:  A flu (influenza) vaccine every year.  A pneumonia (pneumococcal) vaccine and a hepatitis B vaccine. If you are age 19 or older, you may get the pneumonia vaccine as a series of two separate shots. Ask your health care provider which other vaccines may be recommended. Take care of your feet Diabetes may cause you to have poor blood circulation to your legs and feet. Because of this, taking care of your feet is very important. Diabetes can cause:  The skin on the feet to get thinner, break more easily, and heal more slowly.  Nerve damage in your legs and feet, which results in decreased feeling. You may not notice minor injuries that could lead to serious problems. To avoid foot problems:  Check your skin and feet every Daivik Overley for cuts, bruises, redness, blisters, or sores.  Schedule a foot exam with your health care provider once every year. This exam includes: ? Inspecting of the structure and skin of your feet. ? Checking the pulses and sensation in your feet.  Make sure that your health care provider performs a visual foot exam at every medical visit.  Take care of your teeth People with poorly controlled diabetes are more likely to have gum (periodontal) disease. Diabetes can make periodontal diseases harder to control. If not treated, periodontal diseases can lead to tooth loss. To prevent this:  Brush your teeth twice a Rosalind Guido.  Floss at least once a Shawneequa Baldridge.  Visit your dentist 2 times a year. Drink responsibly Limit alcohol intake to no more than 1 drink a Azarya Oconnell for nonpregnant women and 2 drinks a Josephine Rudnick for men. One drink equals 12 oz of beer, 5 oz of wine, or 1 oz of hard liquor.  It is important to eat food when you drink alcohol to avoid low blood glucose (hypoglycemia). Avoid alcohol if you:  Have a history of alcohol abuse or dependence.  Are pregnant.  Have liver disease, pancreatitis,  advanced neuropathy, or severe hypertriglyceridemia. Lessen stress Living with diabetes can be stressful. When you are experiencing stress, your blood glucose may be affected in two ways:  Stress hormones may cause your blood glucose to rise.  You may be distracted from taking good care of yourself. Be aware of your stress level and make changes to help you manage challenging situations. To lower your stress levels:  Consider joining a support group.  Do planned relaxation or meditation.  Do a hobby that you enjoy.  Maintain healthy relationships.  Exercise regularly.  Work with your health care provider or a mental health professional. Summary  You can take action to prevent or slow down problems that are caused by diabetes (diabetes mellitus). Following your diabetes plan and taking care of yourself can reduce your risk of serious or life-threatening complications.  Follow instructions from your health care providers about managing your diabetes. Your diabetes may be managed by a team of health care providers who can teach you how to care for yourself  and can answer questions that you have.  Your health care provider will tell you how often you need medical visits depending on your diabetes management plan. Keep all follow-up visits as directed. This is important so possible problems can be identified early and complications can be avoided or treated. This information is not intended to replace advice given to you by your health care provider. Make sure you discuss any questions you have with your health care provider. Document Revised: 11/22/2017 Document Reviewed: 05/23/2016 Elsevier Patient Education  2020 [employer]

## 2020-06-30 ENCOUNTER — Other Ambulatory Visit: Payer: Self-pay | Admitting: Internal Medicine

## 2020-07-03 ENCOUNTER — Ambulatory Visit (INDEPENDENT_AMBULATORY_CARE_PROVIDER_SITE_OTHER): Payer: Medicare Other | Admitting: Internal Medicine

## 2020-07-03 ENCOUNTER — Encounter: Payer: Self-pay | Admitting: Internal Medicine

## 2020-07-03 ENCOUNTER — Ambulatory Visit (HOSPITAL_BASED_OUTPATIENT_CLINIC_OR_DEPARTMENT_OTHER)
Admission: RE | Admit: 2020-07-03 | Discharge: 2020-07-03 | Disposition: A | Discharge status: Home / Self Care | Payer: Medicare Other | Source: Ambulatory Visit | Attending: Internal Medicine | Admitting: Internal Medicine

## 2020-07-03 ENCOUNTER — Other Ambulatory Visit: Payer: Self-pay

## 2020-07-03 VITALS — BP 136/70 | HR 52 | Temp 97.7°F | Resp 18 | Ht 67.0 in | Wt 192.1 lb

## 2020-07-03 DIAGNOSIS — I10 Essential (primary) hypertension: Secondary | ICD-10-CM

## 2020-07-03 DIAGNOSIS — M545 Low back pain, unspecified: Secondary | ICD-10-CM | POA: Insufficient documentation

## 2020-07-03 DIAGNOSIS — M47816 Spondylosis without myelopathy or radiculopathy, lumbar region: Secondary | ICD-10-CM | POA: Diagnosis not present

## 2020-07-03 MED ORDER — CYCLOBENZAPRINE HCL 10 MG PO TABS
10.0000 mg | ORAL_TABLET | Freq: Every evening | ORAL | 0 refills | Status: DC | PRN
Start: 2020-07-03 — End: 2021-12-29

## 2020-07-03 NOTE — Progress Notes (Signed)
Pre visit review using our clinic review tool, if applicable. No additional management support is needed unless otherwise documented below in the visit note. 

## 2020-07-03 NOTE — Patient Instructions (Signed)
Tylenol  500 mg OTC 2 tabs a day every 8 hours as needed for pain  Flexeril, a muscle relaxant, at night as needed  Warm compress   GO TO THE LAB : Get the blood work      STOP BY THE FIRST FLOOR:  get the XR    Call if not gradually better in the next 3 to 4 weeks.

## 2020-07-03 NOTE — Progress Notes (Signed)
Subjective:    Patient ID: Timothy Stone., male    DOB: Feb 01, 1941, 79 y.o.   MRN: [id number]  DOS:  07/03/2020 Type of visit - description: Acute Almost a year history of right-sided mid back pain without radiation. The pain is steady, never goes away, intensity increased with bending and twisting. No radiation, denies any rash or injury.  He did have pain located that the low back &  associated with right hip pain. That the specific pain is actually better after he did physical therapy, see visit from 11-2019.  Review of Systems Denies any fever chills No dysuria or gross hematuria No unusual lower extremity tingling and paresthesias   Past Medical History:  Diagnosis Date  . Actinic keratosis    hyperplastic aktinic keratosis forehead s/p surgery Dr Park Liter  . BPH (benign prostatic hyperplasia)    s/p TUNA  . Chronic cystitis    Dr. Liliane Shi  . DVT (deep venous thrombosis) (HCC) 2005   no know triggers per pt  . HTN (hypertension)    dx in the 70s  . OSA (obstructive sleep apnea)    on CPAP  . Recurrent UTI   . Umbilical hernia    no surgery  . Vitamin D deficiency     Past Surgical History:  Procedure Laterality Date  . PROSTATE SURGERY  2003   TUNA around 2003    Allergies as of 07/03/2020      Reactions   Hydrocodone Shortness Of Breath   Oxycodone    Latex Hives      Medication List       Accurate as of July 03, 2020 11:59 PM. If you have any questions, ask your nurse or doctor.        acetaminophen 500 MG tablet Commonly known as: TYLENOL Take 500 mg by mouth as needed for mild pain or headache.   apixaban 2.5 MG Tabs tablet Commonly known as: Eliquis Take 1 tablet (2.5 mg total) by mouth 2 (two) times daily.   bisoprolol-hydrochlorothiazide 5-6.25 MG tablet Commonly known as: ZIAC Take 1 tablet by mouth daily. Okay to take a second tablet if BP more than 140/85   cyclobenzaprine 10 MG tablet Commonly known as: FLEXERIL Take 1  tablet (10 mg total) by mouth at bedtime as needed for muscle spasms. Started by: Willow Ora, MD   diclofenac Sodium 1 % Gel Commonly known as: VOLTAREN Apply 2 g topically 4 (four) times daily.   doxazosin 4 MG tablet Commonly known as: CARDURA Take 1 tablet (4 mg total) by mouth at bedtime.   Flaxseed Oil Oil Take 1,400 mg by mouth daily.   fluticasone 50 MCG/ACT nasal spray Commonly known as: FLONASE Place 2 sprays into both nostrils daily as needed for allergies or rhinitis.   OVER THE COUNTER MEDICATION Take 1 tablet by mouth daily. Prostate Plus Health Complex w/ Cran-Max-Cranberry 1 daily   Vitamin D3 50 MCG (2000 UT) Tabs Take 2,000 Units by mouth daily.          Objective:   Physical Exam Skin:        BP 136/70 (BP Location: Left Arm, Patient Position: Sitting, Cuff Size: Normal)   Pulse (!) 52   Temp 97.7 F (36.5 C) (Oral)   Resp 18   Ht 5\' 7"  (1.702 m)   Wt 192 lb 2 oz (87.1 kg)   SpO2 99%   BMI 30.09 kg/m  General:   Well developed, NAD, BMI noted.  HEENT:  Normocephalic . Face symmetric, atraumatic MSK: No TTP at the lumbosacral spine or SI joints No TTP at the lumbar spine. Abdomen:  Not distended, soft, non-tender. No rebound or rigidity.   Skin: Not pale. Not jaundice Lower extremities: no pretibial edema bilaterally  Neurologic:  alert & oriented X3.  Speech normal, gait appropriate for age and unassisted Psych--  Cognition and judgment appear intact.  Cooperative with normal attention span and concentration.  Behavior appropriate. No anxious or depressed appearing.     Assessment    Assessment: Prediabetes HTN Bigeminy: Saw cardiology 2019, Holter: 80% QRS with PVCs.  Echo was done, see report BPH, recurrent UTIs, chronic cystitis OSA, on CPAP Umbilical hernia Actinic keratosis; no derm eval recently as off 02/2018 Tinnitus  H/o vitamin D deficiency H/o R leg  DVT, 2005 and 2020, unprovoked   PLAN: Right lumbar  pain: Symptoms a started many months ago, on 11-2019 he was seen with low back pain/right hip pain, those particular symptoms resolved with physical therapy, the right lumbar pain continue. On clinical grounds seems to be MSK related. We are checking labs, he is anticoagulated, if he has developed acute anemia will need a scan to rule out retroperitoneal bleed. If he has hematuria on UA, will also need imaging of the kidney. Otherwise will get a plain x-ray, treat with Tylenol, heating, Flexeril. If no better let me know. HTN: Check a BMP and CBC.  This visit occurred during the SARS-CoV-2 public health emergency.  Safety protocols were in place, including screening questions prior to the visit, additional usage of staff PPE, and extensive cleaning of exam room while observing appropriate contact time as indicated for disinfecting solutions.

## 2020-07-04 LAB — CBC WITH DIFFERENTIAL/PLATELET
Absolute Monocytes: 389 cells/uL (ref 200–950)
Basophils Absolute: 20 cells/uL (ref 0–200)
Basophils Relative: 0.3 %
Eosinophils Absolute: 40 cells/uL (ref 15–500)
Eosinophils Relative: 0.6 %
HCT: 42.3 % (ref 38.5–50.0)
Hemoglobin: 14.3 g/dL (ref 13.2–17.1)
Lymphs Abs: 2125 cells/uL (ref 850–3900)
MCH: 30.2 pg (ref 27.0–33.0)
MCHC: 33.8 g/dL (ref 32.0–36.0)
MCV: 89.4 fL (ref 80.0–100.0)
MPV: 10 fL (ref 7.5–12.5)
Monocytes Relative: 5.9 %
Neutro Abs: 4026 cells/uL (ref 1500–7800)
Neutrophils Relative %: 61 %
Platelets: 196 10*3/uL (ref 140–400)
RBC: 4.73 10*6/uL (ref 4.20–5.80)
RDW: 13.3 % (ref 11.0–15.0)
Total Lymphocyte: 32.2 %
WBC: 6.6 10*3/uL (ref 3.8–10.8)

## 2020-07-04 LAB — URINALYSIS, ROUTINE W REFLEX MICROSCOPIC
Bilirubin Urine: NEGATIVE
Glucose, UA: NEGATIVE
Hgb urine dipstick: NEGATIVE
Ketones, ur: NEGATIVE
Leukocytes,Ua: NEGATIVE
Nitrite: NEGATIVE
Protein, ur: NEGATIVE
Specific Gravity, Urine: 1.007 (ref 1.001–1.03)
pH: 5.5 (ref 5.0–8.0)

## 2020-07-04 LAB — BASIC METABOLIC PANEL
BUN/Creatinine Ratio: 24 (calc) — ABNORMAL HIGH (ref 6–22)
BUN: 27 mg/dL — ABNORMAL HIGH (ref 7–25)
CO2: 26 mmol/L (ref 20–32)
Calcium: 9.7 mg/dL (ref 8.6–10.3)
Chloride: 102 mmol/L (ref 98–110)
Creat: 1.13 mg/dL — ABNORMAL HIGH (ref 0.70–1.11)
Glucose, Bld: 89 mg/dL (ref 65–99)
Potassium: 4.8 mmol/L (ref 3.5–5.3)
Sodium: 137 mmol/L (ref 135–146)

## 2020-07-04 NOTE — Assessment & Plan Note (Signed)
Right lumbar pain: Symptoms a started many months ago, on 11-2019 he was seen with low back pain/right hip pain, those particular symptoms resolved with physical therapy, the right lumbar pain continue. On clinical grounds seems to be MSK related. We are checking labs, he is anticoagulated, if he has developed acute anemia will need a scan to rule out retroperitoneal bleed. If he has hematuria on UA, will also need imaging of the kidney. Otherwise will get a plain x-ray, treat with Tylenol, heating, Flexeril. If no better let me know. HTN: Check a BMP and CBC.

## 2020-07-12 ENCOUNTER — Ambulatory Visit: Payer: Medicare Other | Attending: Internal Medicine

## 2020-07-12 ENCOUNTER — Other Ambulatory Visit (HOSPITAL_BASED_OUTPATIENT_CLINIC_OR_DEPARTMENT_OTHER): Payer: Self-pay | Admitting: Internal Medicine

## 2020-07-12 DIAGNOSIS — Z23 Encounter for immunization: Secondary | ICD-10-CM

## 2020-07-12 NOTE — Progress Notes (Signed)
   [id number]  Name:  Timothy Stone.    MRN: [id number] DOB: 10-10-1940  07/12/2020  Timothy Stone was observed post Covid-19 immunization for 15 minutes without incident. He was provided with Vaccine Information Sheet and instruction to access the V-Safe system.   Timothy Stone was instructed to call 911 with any severe reactions post vaccine: Marland Kitchen Difficulty breathing  . Swelling of face and throat  . A fast heartbeat  . A bad rash all over body  . Dizziness and weakness

## 2020-07-15 ENCOUNTER — Other Ambulatory Visit: Payer: Self-pay | Admitting: Hematology & Oncology

## 2020-07-15 DIAGNOSIS — I824Z1 Acute embolism and thrombosis of unspecified deep veins of right distal lower extremity: Secondary | ICD-10-CM

## 2020-07-18 MED FILL — PFIZER-BIONTECH COVID-19 VA: 30 | 1 days supply | Qty: 0 | Fill #0

## 2020-07-19 ENCOUNTER — Other Ambulatory Visit: Payer: Self-pay | Admitting: *Deleted

## 2020-07-19 DIAGNOSIS — I824Z1 Acute embolism and thrombosis of unspecified deep veins of right distal lower extremity: Secondary | ICD-10-CM

## 2020-07-19 DIAGNOSIS — N183 Chronic kidney disease, stage 3 unspecified: Secondary | ICD-10-CM

## 2020-07-22 ENCOUNTER — Inpatient Hospital Stay: Payer: Medicare Other

## 2020-07-22 ENCOUNTER — Inpatient Hospital Stay: Payer: Medicare Other | Attending: Family | Admitting: Family

## 2020-07-22 ENCOUNTER — Other Ambulatory Visit: Payer: Self-pay

## 2020-07-22 ENCOUNTER — Encounter: Payer: Self-pay | Admitting: Family

## 2020-07-22 VITALS — BP 149/82 | HR 63 | Temp 97.7°F | Resp 16 | Ht 67.0 in | Wt 190.0 lb

## 2020-07-22 DIAGNOSIS — Z86718 Personal history of other venous thrombosis and embolism: Secondary | ICD-10-CM | POA: Diagnosis not present

## 2020-07-22 DIAGNOSIS — Z7901 Long term (current) use of anticoagulants: Secondary | ICD-10-CM | POA: Diagnosis not present

## 2020-07-22 DIAGNOSIS — I824Z1 Acute embolism and thrombosis of unspecified deep veins of right distal lower extremity: Secondary | ICD-10-CM | POA: Diagnosis not present

## 2020-07-22 DIAGNOSIS — N183 Chronic kidney disease, stage 3 unspecified: Secondary | ICD-10-CM

## 2020-07-22 LAB — CBC WITH DIFFERENTIAL (CANCER CENTER ONLY)
Abs Immature Granulocytes: 0.01 10*3/uL (ref 0.00–0.07)
Basophils Absolute: 0 10*3/uL (ref 0.0–0.1)
Basophils Relative: 1 %
Eosinophils Absolute: 0.1 10*3/uL (ref 0.0–0.5)
Eosinophils Relative: 1 %
HCT: 44.4 % (ref 39.0–52.0)
Hemoglobin: 14.9 g/dL (ref 13.0–17.0)
Immature Granulocytes: 0 %
Lymphocytes Relative: 39 %
Lymphs Abs: 2.4 10*3/uL (ref 0.7–4.0)
MCH: 29.9 pg (ref 26.0–34.0)
MCHC: 33.6 g/dL (ref 30.0–36.0)
MCV: 89 fL (ref 80.0–100.0)
Monocytes Absolute: 0.3 10*3/uL (ref 0.1–1.0)
Monocytes Relative: 6 %
Neutro Abs: 3.3 10*3/uL (ref 1.7–7.7)
Neutrophils Relative %: 53 %
Platelet Count: 176 10*3/uL (ref 150–400)
RBC: 4.99 MIL/uL (ref 4.22–5.81)
RDW: 12.8 % (ref 11.5–15.5)
WBC Count: 6.2 10*3/uL (ref 4.0–10.5)
nRBC: 0 % (ref 0.0–0.2)

## 2020-07-22 LAB — D-DIMER, QUANTITATIVE: D-Dimer, Quant: 0.55 ug/mL-FEU — ABNORMAL HIGH (ref 0.00–0.50)

## 2020-07-22 LAB — CMP (CANCER CENTER ONLY)
ALT: 21 U/L (ref 0–44)
AST: 16 U/L (ref 15–41)
Albumin: 4.3 g/dL (ref 3.5–5.0)
Alkaline Phosphatase: 51 U/L (ref 38–126)
Anion gap: 7 (ref 5–15)
BUN: 26 mg/dL — ABNORMAL HIGH (ref 8–23)
CO2: 29 mmol/L (ref 22–32)
Calcium: 9.9 mg/dL (ref 8.9–10.3)
Chloride: 104 mmol/L (ref 98–111)
Creatinine: 1.21 mg/dL (ref 0.61–1.24)
GFR, Estimated: >60 mL/min (ref >60)
Glucose, Bld: 139 mg/dL — ABNORMAL HIGH (ref 70–99)
Potassium: 4.4 mmol/L (ref 3.5–5.1)
Sodium: 140 mmol/L (ref 135–145)
Total Bilirubin: 0.6 mg/dL (ref 0.3–1.2)
Total Protein: 7.1 g/dL (ref 6.5–8.1)

## 2020-07-22 NOTE — Progress Notes (Signed)
Hematology and Oncology Follow Up Visit  Timothy Stone [id number] 21-Oct-1940 79 y.o. 07/22/2020   Principle Diagnosis:  Acute RLE DVT, unprovoked -Remote hx of DVT in early 2000's; superficial venous thromboses in the RLE in 2015, no DVT  -08/2019: acute DVT involving R peroneal, posterior tibial, and popliteal veins  Current Therapy:   Eliquis 2.5 mg PO BID maintenance - life long   Interim History:  Timothy Stone is here today for follow-up. He is doing well and has no complaints at this time.  He continues to do well on Eliquis. No new recurrence.  No episodes of bleeding. No abnormal bruising or petechiae.  No fever, chills, n/v, cough, rash, dizziness, SOB, chest pain, palpitations, abdominal pain or changes in bowel or bladder habits.  No swelling, tenderness, numbness or tingling in her extremities.  No falls or syncope.  He has maintained a good appetite and is staying well hydrated. His weight is stable.  He works out at [employer] daily with his wife. They also have a [place] cruise planned for in the early spring!  ECOG Performance Status: 0 - Asymptomatic  Medications:  Allergies as of 07/22/2020      Reactions   Hydrocodone Shortness Of Breath   Oxycodone    Latex Hives      Medication List       Accurate as of July 22, 2020  8:57 AM. If you have any questions, ask your nurse or doctor.        acetaminophen 500 MG tablet Commonly known as: TYLENOL Take 500 mg by mouth as needed for mild pain or headache.   bisoprolol-hydrochlorothiazide 5-6.25 MG tablet Commonly known as: ZIAC Take 1 tablet by mouth daily. Okay to take a second tablet if BP more than 140/85   cyclobenzaprine 10 MG tablet Commonly known as: FLEXERIL Take 1 tablet (10 mg total) by mouth at bedtime as needed for muscle spasms.   diclofenac Sodium 1 % Gel Commonly known as: VOLTAREN Apply 2 g topically 4 (four) times daily.   doxazosin 4 MG tablet Commonly known as: CARDURA Take  1 tablet (4 mg total) by mouth at bedtime.   Eliquis 2.5 MG Tabs tablet Generic drug: apixaban TAKE 1 TABLET BY MOUTH  TWICE DAILY   Flaxseed Oil Oil Take 1,400 mg by mouth daily.   fluticasone 50 MCG/ACT nasal spray Commonly known as: FLONASE Place 2 sprays into both nostrils daily as needed for allergies or rhinitis.   OVER THE COUNTER MEDICATION Take 1 tablet by mouth daily. Prostate Plus Health Complex w/ Cran-Max-Cranberry 1 daily   Vitamin D3 50 MCG (2000 UT) Tabs Take 2,000 Units by mouth daily.       Allergies:  Allergies  Allergen Reactions  . Hydrocodone Shortness Of Breath  . Oxycodone   . Latex Hives    Past Medical History, Surgical history, Social history, and Family History were reviewed and updated.  Review of Systems: All other 10 point review of systems is negative.   Physical Exam:  height is 5\' 7"  (1.702 m) and weight is 190 lb 0.6 oz (86.2 kg). His oral temperature is 97.7 F (36.5 C). His blood pressure is 149/82 (abnormal) and his pulse is 63. His respiration is 16 and oxygen saturation is 97%.   Wt Readings from Last 3 Encounters:  07/22/20 190 lb 0.6 oz (86.2 kg)  07/03/20 192 lb 2 oz (87.1 kg)  06/12/20 191 lb 6 oz (86.8 kg)    Ocular:  Sclerae unicteric, pupils equal, round and reactive to light Ear-nose-throat: Oropharynx clear, dentition fair Lymphatic: No cervical or supraclavicular adenopathy Lungs no rales or rhonchi, good excursion bilaterally Heart regular rate and rhythm, no murmur appreciated Abd soft, nontender, positive bowel sounds MSK no focal spinal tenderness, no joint edema Neuro: non-focal, well-oriented, appropriate affect Breasts: Deferred   Lab Results  Component Value Date   WBC 6.2 07/22/2020   HGB 14.9 07/22/2020   HCT 44.4 07/22/2020   MCV 89.0 07/22/2020   PLT 176 07/22/2020   No results found for: FERRITIN, IRON, TIBC, UIBC, IRONPCTSAT Lab Results  Component Value Date   RBC 4.99 07/22/2020   No  results found for: KPAFRELGTCHN, LAMBDASER, KAPLAMBRATIO No results found for: IGGSERUM, IGA, IGMSERUM No results found for: Dorene Ar, A1GS, A2GS, Karn Pickler, SPEI   Chemistry      Component Value Date/Time   NA 140 07/22/2020 0813   K 4.4 07/22/2020 0813   CL 104 07/22/2020 0813   CO2 29 07/22/2020 0813   BUN 26 (H) 07/22/2020 0813   CREATININE 1.21 07/22/2020 0813   CREATININE 1.13 (H) 07/03/2020 1317      Component Value Date/Time   CALCIUM 9.9 07/22/2020 0813   ALKPHOS 51 07/22/2020 0813   AST 16 07/22/2020 0813   ALT 21 07/22/2020 0813   BILITOT 0.6 07/22/2020 0813       Impression and Plan: Timothy Stone is a very pleasant 79 yo gentleman with 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 He is on maintenance anticoagulation with Eliquis, life long.  He has done quite well and so far there have been no new recurrences.  He will continue his same regimen.  Follow-up in 6 months.  He can contact our office with any questions or concerns.   Emeline Gins, NP 11/15/20218:57 AM

## 2020-07-23 ENCOUNTER — Telehealth: Payer: Self-pay

## 2020-07-23 NOTE — Telephone Encounter (Signed)
Called and left message with 01/2021 appts per 11/15 los, calendar mailed as well....Marland KitchenAOM

## 2020-07-25 ENCOUNTER — Ambulatory Visit: Payer: Medicare Other | Admitting: *Deleted

## 2020-09-19 ENCOUNTER — Telehealth: Payer: Self-pay | Admitting: Pharmacist

## 2020-09-19 NOTE — Progress Notes (Addendum)
° ° °  Chronic Care Management Pharmacy Assistant   Name: Timothy Stone.  MRN: [id number] DOB: Aug 05, 1941  Reason for Encounter: General Disease State  Patient Questions:  1.  Have you seen any other providers since your last visit? Yes  2.  Any changes in your medicines or health? Yes    PCP : Wanda Plump, MD   Their chronic conditions include: Hypertension, Hyperlipidemia, Pre-Diabetes, Hx of DVT, BPH  Office Visits: 07-03-2020 (PCP) Patient presented in the office c/o right sided mid back pain. Pain reported as steady and is increased with bending and twisting Lab work was ordered and x-ray of the lumbar spine.  Medication changes indicated: Cyclobenzaprine 10 mg tab daily.  Consults: 07-22-2020 (Oncology) f/u with maintenance Eliquis regimen. No medication changes.   Allergies:   Allergies  Allergen Reactions   Hydrocodone Shortness Of Breath   Oxycodone    Latex Hives    Medications: Outpatient Encounter Medications as of 09/19/2020  Medication Sig   acetaminophen (TYLENOL) 500 MG tablet Take 500 mg by mouth as needed for mild pain or headache.    bisoprolol-hydrochlorothiazide (ZIAC) 5-6.25 MG tablet Take 1 tablet by mouth daily. Okay to take a second tablet if BP more than 140/85   Cholecalciferol (VITAMIN D3) 2000 units TABS Take 2,000 Units by mouth daily.    cyclobenzaprine (FLEXERIL) 10 MG tablet Take 1 tablet (10 mg total) by mouth at bedtime as needed for muscle spasms.   diclofenac Sodium (VOLTAREN) 1 % GEL Apply 2 g topically 4 (four) times daily.   doxazosin (CARDURA) 4 MG tablet Take 1 tablet (4 mg total) by mouth at bedtime.   ELIQUIS 2.5 MG TABS tablet TAKE 1 TABLET BY MOUTH  TWICE DAILY   Flaxseed Oil OIL Take 1,400 mg by mouth daily.    fluticasone (FLONASE) 50 MCG/ACT nasal spray Place 2 sprays into both nostrils daily as needed for allergies or rhinitis.   OVER THE COUNTER MEDICATION Take 1 tablet by mouth daily. Prostate Plus Health Complex w/  Cran-Max-Cranberry 1 daily    No facility-administered encounter medications on file as of 09/19/2020.    Current Diagnosis: Patient Active Problem List   Diagnosis Date Noted   DVT (deep venous thrombosis) (HCC) 08/21/2019   PVC (premature ventricular contraction) 06/29/2018   PCP NOTES >>>>>>>>>>>>>>>>>>>> 02/12/2016   Umbilical hernia 11/01/2012   Hyperglycemia 01/20/2012   Annual physical exam 08/10/2011   BPH (benign prostatic hyperplasia)    Recurrent UTI    HYPERTRIGLYCERIDEMIA 11/16/2008   Obstructive sleep apnea 03/07/2008   Essential hypertension 02/21/2007   ALLERGIC RHINITIS 02/21/2007    Goals Addressed   None    Contacted the patient for a General Adherence touch point. Patient stated he was doing pretty good. He is no longer using flexeril for back pain. Attended a total of ten physical therapy sessions. He is currently doing instructed exercises three to four times a week in the gym. Patient also reported taking two Tylenol daily to help manage his pain. He did not have any concerns he needed to address and has no issues obtaining his medications.  Follow-Up:  Pharmacist Review   Corwin Levins, [facility] Clinical Pharmacist Assistant [phone]  5 minutes spent in review, coordination, and documentation.  Reviewed by: Willa Frater, PharmD Clinical Pharmacist [facility] [phone]

## 2020-10-14 ENCOUNTER — Other Ambulatory Visit: Payer: Self-pay

## 2020-10-14 ENCOUNTER — Encounter: Payer: Self-pay | Admitting: Internal Medicine

## 2020-10-14 ENCOUNTER — Ambulatory Visit (INDEPENDENT_AMBULATORY_CARE_PROVIDER_SITE_OTHER): Payer: Medicare Other | Admitting: Internal Medicine

## 2020-10-14 VITALS — BP 147/82 | HR 55 | Temp 97.8°F | Resp 18 | Ht 67.0 in | Wt 196.5 lb

## 2020-10-14 DIAGNOSIS — R739 Hyperglycemia, unspecified: Secondary | ICD-10-CM

## 2020-10-14 DIAGNOSIS — E559 Vitamin D deficiency, unspecified: Secondary | ICD-10-CM | POA: Diagnosis not present

## 2020-10-14 DIAGNOSIS — E781 Pure hyperglyceridemia: Secondary | ICD-10-CM | POA: Diagnosis not present

## 2020-10-14 DIAGNOSIS — Z0000 Encounter for general adult medical examination without abnormal findings: Secondary | ICD-10-CM | POA: Diagnosis not present

## 2020-10-14 LAB — LIPID PANEL
Cholesterol: 164 mg/dL (ref 0–200)
HDL: 33.2 mg/dL — ABNORMAL LOW (ref >39.00)
LDL Cholesterol: 102 mg/dL — ABNORMAL HIGH (ref 0–99)
NonHDL: 130.34
Total CHOL/HDL Ratio: 5
Triglycerides: 144 mg/dL (ref 0.0–149.0)
VLDL: 28.8 mg/dL (ref 0.0–40.0)

## 2020-10-14 LAB — HEMOGLOBIN A1C: Hgb A1c MFr Bld: 5.9 % (ref 4.6–6.5)

## 2020-10-14 LAB — VITAMIN D 25 HYDROXY (VIT D DEFICIENCY, FRACTURES): VITD: 44.15 ng/mL (ref 30.00–100.00)

## 2020-10-14 NOTE — Progress Notes (Signed)
Subjective:    Patient ID: Timothy Harry., male    DOB: [birth year], 80 y.o.   MRN: [id number]  DOS:  10/14/2020 Type of visit - description: CPX Since the last office visit is doing well. Today with talk about his blood pressure, occasionally it gets low and he gets dizzy.  BP Readings from Last 3 Encounters:  10/14/20 (!) 147/82  07/22/20 (!) 149/82  07/03/20 136/70     Review of Systems  Other than above, a 14 point review of systems is negative     Past Medical History:  Diagnosis Date  . Actinic keratosis    hyperplastic aktinic keratosis forehead s/p surgery Dr Park Liter  . BPH (benign prostatic hyperplasia)    s/p TUNA  . Chronic cystitis    Dr. Liliane Shi  . DVT (deep venous thrombosis) (HCC) 2005   no know triggers per pt  . HTN (hypertension)    dx in the 70s  . OSA (obstructive sleep apnea)    on CPAP  . Recurrent UTI   . Umbilical hernia    no surgery  . Vitamin D deficiency     Past Surgical History:  Procedure Laterality Date  . PROSTATE SURGERY  2003   TUNA around 2003    Allergies as of 10/14/2020      Reactions   Hydrocodone Shortness Of Breath   Oxycodone    Latex Hives      Medication List       Accurate as of October 14, 2020  3:57 PM. If you have any questions, ask your nurse or doctor.        acetaminophen 500 MG tablet Commonly known as: TYLENOL Take 500 mg by mouth as needed for mild pain or headache.   bisoprolol-hydrochlorothiazide 5-6.25 MG tablet Commonly known as: ZIAC Take 1 tablet by mouth daily. Okay to take a second tablet if BP more than 140/85   cyclobenzaprine 10 MG tablet Commonly known as: FLEXERIL Take 1 tablet (10 mg total) by mouth at bedtime as needed for muscle spasms.   diclofenac Sodium 1 % Gel Commonly known as: VOLTAREN Apply 2 g topically 4 (four) times daily.   doxazosin 4 MG tablet Commonly known as: CARDURA Take 1 tablet (4 mg total) by mouth at bedtime.   Eliquis 2.5 MG Tabs  tablet Generic drug: apixaban TAKE 1 TABLET BY MOUTH  TWICE DAILY   Flaxseed Oil Oil Take 1,400 mg by mouth daily.   fluticasone 50 MCG/ACT nasal spray Commonly known as: FLONASE Place 2 sprays into both nostrils daily as needed for allergies or rhinitis.   OVER THE COUNTER MEDICATION Take 1 tablet by mouth daily. Prostate Plus Health Complex w/ Cran-Max-Cranberry 1 daily   Vitamin D3 50 MCG (2000 UT) Tabs Take 2,000 Units by mouth daily.          Objective:   Physical Exam BP (!) 147/82 (BP Location: Left Arm, Patient Position: Sitting, Cuff Size: Normal)   Pulse (!) 55   Temp 97.8 F (36.6 C) (Oral)   Resp 18   Ht 5\' 7"  (1.702 m)   Wt 196 lb 8 oz (89.1 kg)   SpO2 98%   BMI 30.78 kg/m  General: Well developed, NAD, BMI noted Neck: No  thyromegaly  HEENT:  Normocephalic . Face symmetric, atraumatic Lungs:  CTA B Normal respiratory effort, no intercostal retractions, no accessory muscle use. Heart: Bradycardic Abdomen:  Not distended, soft, non-tender. No rebound or rigidity.   Lower extremities:  no pretibial edema bilaterally  Skin: Exposed areas without rash. Not pale. Not jaundice Neurologic:  alert & oriented X3.  Speech normal, gait appropriate for age and unassisted Strength symmetric and appropriate for age.  Psych: Cognition and judgment appear intact.  Cooperative with normal attention span and concentration.  Behavior appropriate. No anxious or depressed appearing.     Assessment     Assessment: Prediabetes HTN Bigeminy: Saw cardiology 2019, Holter: 80% QRS with PVCs.  Echo was done, see report BPH, recurrent UTIs, chronic cystitis OSA, on CPAP Umbilical hernia Actinic keratosis; no derm eval recently as off 02/2018 Tinnitus  H/o vitamin D deficiency H/o R leg  DVT, 2005 and 2020, unprovoked, hematology Rx lifelong anticoagulant   PLAN: Here for CPX Prediabetes,  doing well with lifestyle.  Check A1c. HTN: Takes Ziac and Cardura in  the morning, after medications sometimes BP gets in the 110s particularly if he exercises at the gym, he gets dizzy.  We agreed to change Cardura dose to p.m., monitor BPs.  Goal less than 140/85.  Another goal is to prevent hypotension. Bigeminy: Seems asymptomatic Vitamin D deficiency: On supplements, check labs RTC 6 months  This visit occurred during the SARS-CoV-2 public health emergency.  Safety protocols were in place, including screening questions prior to the visit, additional usage of staff PPE, and extensive cleaning of exam room while observing appropriate contact time as indicated for disinfecting solutions.

## 2020-10-14 NOTE — Assessment & Plan Note (Signed)
Here for CPX Prediabetes,  doing well with lifestyle.  Check A1c. HTN: Takes Ziac and Cardura in the morning, after medications sometimes BP gets in the 110s particularly if he exercises at the gym, he gets dizzy.  We agreed to change Cardura dose to p.m., monitor BPs.  Goal less than 140/85.  Another goal is to prevent hypotension. Bigeminy: Seems asymptomatic Vitamin D deficiency: On supplements, check labs RTC 6 months

## 2020-10-14 NOTE — Progress Notes (Signed)
Pre visit review using our clinic review tool, if applicable. No additional management support is needed unless otherwise documented below in the visit note. 

## 2020-10-14 NOTE — Assessment & Plan Note (Signed)
--  Td 2015 - pneumonia shot 02/2018;  prevnar 02-2014 -  Zostavax:2011 - shingrex discussed before, rec to proceed  -COVID VAX x3 -Had a flu shot --CCS:  Cscope @ [facility] 11-08-2001 Dr Loman Chroman: small internal Hemorrhoids. Cscope 05-12-2012, 1 polyp, tubular adenoma, Cscope again 03/30/17, no report --DRE PSA: Sees urology --Labs:  FLP, A1c, vitamin D --lifestyle: gym x 5/week, diet discussed  -- Advance directives discussed

## 2020-10-14 NOTE — Patient Instructions (Addendum)
Change the timing of Cardura to nighttime  Continue checking your blood pressures regularly  BP GOAL is between 110/65 and  140/85. If it is consistently higher or lower, let me know   Proceed with the Shingrix vaccine at your convenience  GO TO THE LAB : Get the blood work     GO TO THE FRONT DESK, PLEASE SCHEDULE YOUR APPOINTMENTS Come back for for a checkup in 6 months

## 2020-11-05 ENCOUNTER — Other Ambulatory Visit: Payer: Self-pay

## 2020-11-05 ENCOUNTER — Ambulatory Visit: Payer: Medicare Other | Admitting: Cardiology

## 2020-11-05 ENCOUNTER — Encounter: Payer: Self-pay | Admitting: Cardiology

## 2020-11-05 VITALS — BP 122/76 | HR 60 | Ht 67.0 in | Wt 196.0 lb

## 2020-11-05 DIAGNOSIS — I493 Ventricular premature depolarization: Secondary | ICD-10-CM | POA: Diagnosis not present

## 2020-11-05 DIAGNOSIS — I10 Essential (primary) hypertension: Secondary | ICD-10-CM

## 2020-11-05 NOTE — Patient Instructions (Signed)
Medication Instructions:  Your physician recommends that you continue on your current medications as directed. Please refer to the Current Medication list given to you today.  Labwork: None ordered.  Testing/Procedures: None ordered.  Follow-Up: Your physician wants you to follow-up in: one year with Dr. Lambert.   You will receive a reminder letter in the mail two months in advance. If you don't receive a letter, please call our office to schedule the follow-up appointment.   Any Other Special Instructions Will Be Listed Below (If Applicable).  If you need a refill on your cardiac medications before your next appointment, please call your pharmacy.   

## 2020-11-05 NOTE — Progress Notes (Signed)
Electrophysiology Office Follow up Visit Note:    Date:  11/05/2020   ID:  Timothy Stone., DOB 04-18-1941, MRN [id number]  PCP:  Wanda Plump, MD  [facility] Cardiologist:  Lesleigh Noe, MD  [facility] Electrophysiologist:  Lanier Prude, MD    Interval History:    Timothy Stone. is a 80 y.o. male who presents for a follow up visit.  I last saw the patient Timothy Stone May 16, 2020 for PVCs.  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  His ejection fraction appeared normal. He has been doing well and goes to the gym daily. He walked 2 miles earlier today without difficulty. He wants to increase his strength training regimen.     Past Medical History:  Diagnosis Date  . Actinic keratosis    hyperplastic aktinic keratosis forehead s/p surgery Dr Park Liter  . BPH (benign prostatic hyperplasia)    s/p TUNA  . Chronic cystitis    Dr. Liliane Shi  . DVT (deep venous thrombosis) (HCC) 2005   no know triggers per pt  . HTN (hypertension)    dx in the 70s  . OSA (obstructive sleep apnea)    on CPAP  . Recurrent UTI   . Umbilical hernia    no surgery  . Vitamin D deficiency     Past Surgical History:  Procedure Laterality Date  . PROSTATE SURGERY  2003   TUNA around 2003    Current Medications: Current Meds  Medication Sig  . acetaminophen (TYLENOL) 500 MG tablet Take 500 mg by mouth as needed for mild pain or headache.  . bisoprolol-hydrochlorothiazide (ZIAC) 5-6.25 MG tablet Take 1 tablet by mouth daily. Okay to take a second tablet if BP more than 140/85  . Cholecalciferol (VITAMIN D3) 2000 units TABS Take 2,000 Units by mouth daily.   . cyclobenzaprine (FLEXERIL) 10 MG tablet Take 1 tablet (10 mg total) by mouth at bedtime as needed for muscle spasms.  Marland Kitchen doxazosin (CARDURA) 4 MG tablet Take 1 tablet (4 mg total) by mouth at bedtime.  Marland Kitchen ELIQUIS 2.5 MG TABS tablet TAKE 1 TABLET BY MOUTH  TWICE DAILY  . Flaxseed Oil OIL Take 1,400  mg by mouth daily.   . fluticasone (FLONASE) 50 MCG/ACT nasal spray Place 2 sprays into both nostrils daily as needed for allergies or rhinitis.  Marland Kitchen OVER THE COUNTER MEDICATION Take 1 tablet by mouth daily. Prostate Plus Health Complex w/ Cran-Max-Cranberry 1 daily  . [DISCONTINUED] diclofenac Sodium (VOLTAREN) 1 % GEL Apply 2 g topically 4 (four) times daily.     Allergies:   Hydrocodone, Oxycodone, and Latex   Social History   Socioeconomic History  . Marital status: Married    Spouse name: Not on file  . Number of children: 4  . Years of education: Not on file  . Highest education level: Not on file  Occupational History  . 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 Smoking status: Never Smoker  . Smokeless tobacco: Never Used  Vaping Use  . Vaping Use: Never used  Substance and Sexual Activity  . Alcohol use: Yes    Alcohol/week: 0.0 standard drinks    Comment: 2x per week  . Drug use: No  . Sexual activity: Not Currently  Other Topics Concern  . Not on file  Social History Narrative   Lives w/ wife  Social Determinants of Health   Financial Resource Strain: Not on file  Food Insecurity: Not on file  Transportation Needs: Not on file  Physical Activity: Sufficiently Active  . Days of Exercise per Week: 7 days  . Minutes of Exercise per Session: 60 min  Stress: Not on file  Social Connections: Not on file     Family History: The patient's family history includes Breast cancer in his sister; Diabetes in his mother; Heart attack (age of onset: 60) in his father; Lung cancer (age of onset: 30) in his brother; Rectal cancer (age of onset: 35) in his sister. There is no history of Prostate cancer.  ROS:   Please see the history of present illness.    All other systems reviewed and are negative.  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)  Recent Labs: 03/21/2020: Magnesium 2.4; TSH 2.114 07/22/2020: ALT 21; BUN 26; Creatinine 1.21; Hemoglobin 14.9; Platelet Count 176; Potassium 4.4; Sodium 140  Recent Lipid Panel    Component Value Date/Time   CHOL 164 10/14/2020 0931   TRIG 144.0 10/14/2020 0931   HDL 33.20 (L) 10/14/2020 0931   CHOLHDL 5 10/14/2020 0931   VLDL 28.8 10/14/2020 0931   LDLCALC 102 (H) 10/14/2020 0931   LDLDIRECT 113.0 02/28/2019 1306    Physical Exam:    VS:  BP 122/76   Pulse 60   Ht 5\' 7"  (1.702 m)   Wt 196 lb (88.9 kg)   SpO2 97%   BMI 30.70 kg/m     Wt Readings from Last 3 Encounters:  11/05/20 196 lb (88.9 kg)  10/14/20 196 lb 8 oz (89.1 kg)  07/22/20 190 lb 0.6 oz (86.2 kg)     GEN:  Well nourished, well developed in no acute distress HEENT: Normal NECK: No JVD; No carotid bruits LYMPHATICS: No lymphadenopathy CARDIAC: RRR, no murmurs, rubs, gallops RESPIRATORY:  Clear to auscultation without rales, wheezing or rhonchi  ABDOMEN: Soft, non-tender, non-distended MUSCULOSKELETAL:  No edema; No deformity  SKIN: Warm and dry NEUROLOGIC:  Alert and oriented x 3 PSYCHIATRIC:  Normal affect   ASSESSMENT:    1. PVC (premature ventricular contraction)   2. Essential hypertension    PLAN:    In order of problems listed above:  1. Asymptomatic PVCs No indication at this time for suppression given he is asymptomatic and EF remains normal. Plan for 1 year follow up with echo at that appt.  2. HTN Controlled. Continue current regimen.   Follow up 1 year.     Medication Adjustments/Labs and Tests Ordered: Current medicines are reviewed at length with the patient today.  Concerns regarding medicines are outlined above.  Orders Placed This Encounter  Procedures  . EKG 12-Lead   No orders of the defined types were placed in this encounter.    Signed, 07/24/20, MD, [facility]   11/05/2020 2:06 PM    Electrophysiology [facility]

## 2020-11-12 ENCOUNTER — Telehealth: Payer: Self-pay | Admitting: Pharmacist

## 2020-11-12 NOTE — Progress Notes (Addendum)
° ° °  Chronic Care Management Pharmacy Assistant   Name: Timothy Stone.  MRN: [id number] DOB: February 25, 1941  Reason for Encounter: Disease State For CHL   Conditions to be addressed/monitored: Hypertension, Hyperlipidemia, Pre-Diabetes, Hx of DVT, BPH  Recent office visits:  10/14/20 Wanda Plump, MD. Annual Physical Exam. Labs drawn. Changes the timing of Cardura to nighttime.   Recent consults visits:  11/05/20 Cardiology Steffanie Dunn T, MD. EKG preformed. No medication changes.  Medications: Outpatient Encounter Medications as of 11/12/2020  Medication Sig   acetaminophen (TYLENOL) 500 MG tablet Take 500 mg by mouth as needed for mild pain or headache.   bisoprolol-hydrochlorothiazide (ZIAC) 5-6.25 MG tablet Take 1 tablet by mouth daily. Okay to take a second tablet if BP more than 140/85   Cholecalciferol (VITAMIN D3) 2000 units TABS Take 2,000 Units by mouth daily.    cyclobenzaprine (FLEXERIL) 10 MG tablet Take 1 tablet (10 mg total) by mouth at bedtime as needed for muscle spasms.   doxazosin (CARDURA) 4 MG tablet Take 1 tablet (4 mg total) by mouth at bedtime.   ELIQUIS 2.5 MG TABS tablet TAKE 1 TABLET BY MOUTH  TWICE DAILY   Flaxseed Oil OIL Take 1,400 mg by mouth daily.    fluticasone (FLONASE) 50 MCG/ACT nasal spray Place 2 sprays into both nostrils daily as needed for allergies or rhinitis.   OVER THE COUNTER MEDICATION Take 1 tablet by mouth daily. Prostate Plus Health Complex w/ Cran-Max-Cranberry 1 daily   No facility-administered encounter medications on file as of 11/12/2020.   11/12/2020 Name: Timothy Stone. MRN: [id number] DOB: 06-Oct-1940 Timothy Stone. is a 80 y.o. year old male who is a primary care patient of Wanda Plump, MD.  Comprehensive medication review performed; Spoke to patient regarding cholesterol  Lipid Panel    Component Value Date/Time   CHOL 164 10/14/2020 0931   TRIG 144.0 10/14/2020 0931   HDL 33.20 (L) 10/14/2020 0931   LDLCALC 102 (H)  10/14/2020 0931   LDLDIRECT 113.0 02/28/2019 1306    10-year ASCVD risk score: The ASCVD Risk score Denman George DC Jr., et al., 2013) failed to calculate for the following reasons:   The 2013 ASCVD risk score is only valid for ages 1 to 6  Current antihyperlipidemic regimen:  Flaxseed Oil 1400 mg daily  Previous antihyperlipidemic medications tried: None noted.  ASCVD risk enhancing conditions: age >42 and HTN Pre DM.   What recent interventions/DTPs have been made by any provider to improve Cholesterol control since last CPP Visit: None  Any recent hospitalizations or ED visits since last visit with CPP? Patient stated no.  What diet changes have been made to improve Cholesterol?  Patient stated he only eats fish and chicken. He stated he eats a lot of salads. He stated he tires to drink about 64 oz of water daly.  What exercise is being done to improve Cholesterol?  Patient stated he goes to the gym 6 days a week for about 45 -1 hr. He stated while he is there he walks about 1-2 miles on the treadmill.   Adherence Review: Does the patient have >5 day gap between last estimated fill dates? No.  Star Rating Drugs: none  Follow-Up: Pharmacist review  Hulen Luster, RMA Clinical Pharmacist Assistant [phone]  10 minutes spent in review, coordination, and documentation.  Reviewed by: Willa Frater, PharmD Clinical Pharmacist [facility] [phone]

## 2020-12-26 ENCOUNTER — Ambulatory Visit (INDEPENDENT_AMBULATORY_CARE_PROVIDER_SITE_OTHER): Payer: Medicare Other | Admitting: Pharmacist

## 2020-12-26 DIAGNOSIS — R739 Hyperglycemia, unspecified: Secondary | ICD-10-CM

## 2020-12-26 DIAGNOSIS — I10 Essential (primary) hypertension: Secondary | ICD-10-CM | POA: Diagnosis not present

## 2020-12-26 DIAGNOSIS — N400 Enlarged prostate without lower urinary tract symptoms: Secondary | ICD-10-CM | POA: Diagnosis not present

## 2020-12-26 DIAGNOSIS — I824Z1 Acute embolism and thrombosis of unspecified deep veins of right distal lower extremity: Secondary | ICD-10-CM

## 2020-12-26 NOTE — Chronic Care Management (AMB) (Signed)
Chronic Care Management Pharmacy Note  12/26/2020 Name:  Timothy Stone. MRN:  [id number] DOB:  [birth year]  Subjective: Saw Timothy Stone. is an 80 y.o. year old male who is a primary patient of Paz, Alda Berthold, MD.  The CCM team was consulted for assistance with disease management and care coordination needs.    Engaged with patient by telephone for follow up visit in response to provider referral for pharmacy case management and/or care coordination services.   Consent to Services:  The patient was given information about Chronic Care Management services, agreed to services, and gave verbal consent prior to initiation of services.  Please see initial visit note for detailed documentation.   Patient Care Team: Colon Branch, MD as PCP - General Tamala Julian Lynnell Dike, MD as PCP - Cardiology (Cardiology) Vickie Epley, MD as PCP - Electrophysiology (Cardiology) Ceasar Mons, MD as Consulting Physician (Urology) Ralene Ok, MD as Consulting Physician (General Surgery) Marica Otter, [place] as Consulting Physician (Optometry) Laurance Flatten Velora Heckler., MD as Consulting Physician (Otolaryngology) Cherre Robins, PharmD (Pharmacist)  Recent office visits: 10/14/2020 - PCP (Dr Larose Kells) - complete physical; patient c/o low BP and dizziness. Pt was taking cardura in am - Dr Larose Kells suggested taking in pm and monitor BP; (BP goal <140/85)  Recent consult visits: 11/05/2020- Cardio( Dr.Lambert) f/u PVCs; No indication at this time for suppression given he is asymptomatic and EF remains normal. Plan for 1 year follow up with echo at that appt  Hospital visits: None in previous 6 months  Objective:  Lab Results  Component Value Date   CREATININE 1.21 07/22/2020   CREATININE 1.13 (H) 07/03/2020   CREATININE 1.18 03/21/2020    Lab Results  Component Value Date   HGBA1C 5.9 10/14/2020   Last diabetic Eye exam: No results found for: HMDIABEYEEXA  Last diabetic Foot exam: No results found  for: HMDIABFOOTEX      Component Value Date/Time   CHOL 164 10/14/2020 0931   TRIG 144.0 10/14/2020 0931   HDL 33.20 (L) 10/14/2020 0931   CHOLHDL 5 10/14/2020 0931   VLDL 28.8 10/14/2020 0931   LDLCALC 102 (H) 10/14/2020 0931   LDLDIRECT 113.0 02/28/2019 1306    Hepatic Function Latest Ref Rng & Units 07/22/2020 01/19/2020 08/22/2019  Total Protein 6.5 - 8.1 g/dL 7.1 7.1 7.5  Albumin 3.5 - 5.0 g/dL 4.3 4.4 4.7  AST 15 - 41 U/L 16 16 14(L)  ALT 0 - 44 U/L '21 15 14  ' Alk Phosphatase 38 - 126 U/L 51 56 53  Total Bilirubin 0.3 - 1.2 mg/dL 0.6 0.6 0.8  Bilirubin, Direct 0.0 - 0.3 mg/dL - - -    Lab Results  Component Value Date/Time   TSH 2.114 03/21/2020 03:55 PM   TSH 2.09 03/02/2018 09:44 AM   TSH 2.32 02/15/2017 08:38 AM    CBC Latest Ref Rng & Units 07/22/2020 07/03/2020 03/21/2020  WBC 4.0 - 10.5 K/uL 6.2 6.6 6.5  Hemoglobin 13.0 - 17.0 g/dL 14.9 14.3 14.8  Hematocrit 39.0 - 52.0 % 44.4 42.3 43.5  Platelets 150 - 400 K/uL 176 196 196    Lab Results  Component Value Date/Time   VD25OH 44.15 10/14/2020 09:31 AM   VD25OH 58 05/24/2009 09:48 PM    Clinical ASCVD: No  The ASCVD Risk score ([place]., et al., 2013) failed to calculate for the following reasons:   The 2013 ASCVD risk score is only valid for ages 30 to  48     Social History   Tobacco Use  Smoking Status Never Smoker  Smokeless Tobacco Never Used   BP Readings from Last 3 Encounters:  11/05/20 122/76  10/14/20 (!) 147/82  07/22/20 (!) 149/82   Pulse Readings from Last 3 Encounters:  11/05/20 60  10/14/20 (!) 55  07/22/20 63   Wt Readings from Last 3 Encounters:  11/05/20 196 lb (88.9 kg)  10/14/20 196 lb 8 oz (89.1 kg)  07/22/20 190 lb 0.6 oz (86.2 kg)    Assessment: Review of patient past medical history, allergies, medications, health status, including review of consultants reports, laboratory and other test data, was performed as part of comprehensive evaluation and provision of  chronic care management services.   SDOH:  (Social Determinants of Health) assessments and interventions performed:  SDOH Interventions   Flowsheet Row Most Recent Value  SDOH Interventions   Financial Strain Interventions Intervention Not Indicated  Physical Activity Interventions Intervention Not Indicated      CCM Care Plan  Allergies  Allergen Reactions  . Hydrocodone Shortness Of Breath  . Oxycodone   . Latex Hives    Medications Reviewed Today    Reviewed by Cherre Robins, PharmD (Pharmacist) on 12/26/20 at 1522  Med List Status: <None>  Medication Order Taking? Sig Documenting Provider Last Dose Status Informant  acetaminophen (TYLENOL) 500 MG tablet [id number] Yes Take 500 mg by mouth as needed for mild pain or headache. [provider] Taking Active Self           Med Note [facility], Marca Ancona Mar 28, 2020  9:17 AM)    bisoprolol-hydrochlorothiazide [facility]) 5-6.25 MG tablet [id number] Yes Take 1 tablet by mouth daily. Okay to take a second tablet if BP more than 140/85 Colon Branch, MD Taking Active Self  Cholecalciferol (VITAMIN D3) 2000 units TABS [id number] Yes Take 2,000 Units by mouth daily.  [provider] Taking Active Self  COVID-19 mRNA vaccine, Pfizer, 30 MCG/0.3ML injection [id number] No INJECT AS DIRECTED  Patient not taking: Reported on 12/26/2020   Carlyle Basques, MD Not Taking Consider Medication Status and Discontinue   cyclobenzaprine (FLEXERIL) 10 MG tablet [id number] No Take 1 tablet (10 mg total) by mouth at bedtime as needed for muscle spasms.  Patient not taking: Reported on 12/26/2020   Colon Branch, MD Not Taking Consider Medication Status and Discontinue   doxazosin (CARDURA) 4 MG tablet [id number] Yes Take 1 tablet (4 mg total) by mouth at bedtime. Colon Branch, MD Taking Active Self  ELIQUIS 2.5 MG TABS tablet [id number] Yes TAKE 1 TABLET BY MOUTH  TWICE DAILY Volanda Napoleon, MD Taking Active   Flaxseed Oil OIL [id number] Yes  Take 1,400 mg by mouth daily.  [provider] Taking Active Self  fluticasone (FLONASE) 50 MCG/ACT nasal spray [id number] Yes Place 2 sprays into both nostrils daily as needed for allergies or rhinitis. Colon Branch, MD Taking Active   OVER THE COUNTER MEDICATION [id number] Yes Take 1 tablet by mouth daily. Prostate Plus Health Complex w/ Cran-Max-Cranberry 1 daily [provider] Taking Active Self          Patient Active Problem List   Diagnosis Date Noted  . DVT (deep venous thrombosis) ([place]) 08/21/2019  . PVC (premature ventricular contraction) 06/29/2018  . PCP NOTES >>>>>>>>>>>>>>>>>>>> 02/12/2016  . Umbilical hernia 50/27/7412  . Hyperglycemia 01/20/2012  . Annual physical exam 08/10/2011  . BPH (benign prostatic hyperplasia)   .  Recurrent UTI   . HYPERTRIGLYCERIDEMIA 11/16/2008  . Obstructive sleep apnea 03/07/2008  . Essential hypertension 02/21/2007  . ALLERGIC RHINITIS 02/21/2007    Immunization History  Administered Date(s) Administered  . Fluad Quad(high Dose 65+) 05/24/2019, 06/12/2020  . Influenza Split 06/07/2014  . Influenza Whole 07/28/2007, 05/26/2010  . Influenza, High Dose Seasonal PF 07/02/2015, 05/27/2016, 06/17/2017, 06/16/2018  . PFIZER(Purple Top)SARS-COV-2 Vaccination 09/22/2019, 10/13/2019, 07/12/2020  . Pneumococcal Conjugate-13 02/16/2014  . Pneumococcal Polysaccharide-23 05/18/2008, 03/02/2018  . Td 09/08/2003  . Tetanus 02/16/2014  . Zoster 05/26/2010    Conditions to be addressed/monitored: HTN, HLD and BPH; pre Diabetes; recurrent DVT; vitamin D deficiency  Care Plan : General Pharmacy (Adult)  Updates made by Cherre Robins, PHARMD since 12/26/2020 12:00 AM    Problem: CHL AMB "PATIENT-SPECIFIC PROBLEM"   Priority: High  Onset Date: 12/26/2020  Note:   Current Barriers:  . Chronic Disease Management support, education, and care coordination needs related to Hypertension, Hyperlipidemia, Pre-Diabetes, Recurrent DVT,  BPH  Pharmacist Clinical Goal(s):  Marland Kitchen Over the next 180 days, patient will achieve adherence to monitoring guidelines and medication adherence to achieve therapeutic efficacy through collaboration with PharmD and provider.   Interventions: . 1:1 collaboration with Colon Branch, MD regarding development and update of comprehensive plan of care as evidenced by provider attestation and co-signature . Inter-disciplinary care team collaboration (see longitudinal plan of care) . Comprehensive medication review performed; medication list updated in electronic medical record  Pre - Diabetes: . Controlled;  current treatment: none . Current glucose readings: doesn't check BG at home . Current exercise: daily - usually 60 minutes . No interventions - continue diet and exercise   Hypertension (goal <140/85): BP Readings from Last 3 Encounters:  11/05/20 122/76  10/14/20 (!) 147/82  07/22/20 (!) 149/82   . Improving:  . Current regimen:  o Bisoprolol-hctc 5-6.25mg once each morning (will take additional 1/2 to 1 tablet depending on BP in afternoon) . Patient reports that daytime dizziness has improved with taking doxazosin at night.  . Checked BP at home twice a day. Usually is 130-140 / 70's in morning. If in afternoon BP is >140/85 he will take additional bisoprolol/HCTZ. Take additional tablet about every other day.  . Interventions: o Discussed BP goal o Maintain hypertension medication regimen.   Hyperlipidemia - goal <100: . Not at goal  Lab Results  Component Value Date/Time   LDLCALC 102 (H) 10/14/2020 09:31 AM   LDLDIRECT 113.0 02/28/2019 01:06 PM   . Current regimen:  o Flaxseed Oil 1416m daily . Interventions: o Discussed LDL goal o Discuss statin as an option to get LDL <100 o Maintain cholesterol medication regimen. o Continue to follow low saturated diet and exercise daily  BPH:  . Current regimen:  o Doxazosin 433m- take 1 tablet at  bedtime . Interventions: o Reminded patient to take at night to lower daytime dizziness o Continue current regimen  Medication management . Current pharmacy: [facility] Interventions o Comprehensive medication review performed. Updated med list o Reviewed refill history o Discussed patient assistance for Eliquis when reaches coverage gap. Unfortunately patient's yearly income does not qualify for assistance.   Patient Goals/Self-Care Activities . Over the next 180 days, patient will:  take medications as prescribed, check blood pressure daily, document, and provide at future appointments, and continue to exercise regularly  Follow Up Plan: Telephone follow up appointment with care management team member scheduled for:  6 months  Medication Assistance: Discuss assistance for Eliquis when hits Medicare coverage gap but patient does not qualify based on income.   Patient's preferred pharmacy is:  [facility], [address], [address] [place], [place] [address] Phone: [phone] Fax: [phone]  [id number] [id number]: [phone]: [phone] Pt endorses 95% compliance  Follow Up:  Patient requests no follow-up at this time.  Plan: Telephone follow up appointment with care management team member scheduled for:   6 months  TCherre Robins PharmD Clinical Pharmacist [facility]

## 2020-12-26 NOTE — Patient Instructions (Signed)
Visit Information  PATIENT GOALS: Goals Addressed            This Visit's Progress   . Chronic Care Management Pharmacy Care Plan       CARE PLAN ENTRY (see longitudinal plan of care for additional care plan information)  Current Barriers:  . Chronic Disease Management support, education, and care coordination needs related to Hypertension, Hyperlipidemia, Pre-Diabetes, Hx of DVT, BPH   Hypertension BP Readings from Last 3 Encounters:  11/05/20 122/76  10/14/20 (!) 147/82  07/22/20 (!) 149/82   . Pharmacist Clinical Goal(s): o Over the next 180 days, patient will work with PharmD and providers to maintain BP goal <140/90 . Current regimen:  o Bisoprolol-hctc 5-6.25mg  once each morning (will take additional 1/2 to 1 tablet depending on BP in afternoon) . Interventions: o Discussed BP goal . Patient self care activities - Over the next 180 days, patient will: o Maintain hypertension medication regimen.   Hyperlipidemia Lab Results  Component Value Date/Time   LDLCALC 102 (H) 10/14/2020 09:31 AM   LDLDIRECT 113.0 02/28/2019 01:06 PM   . Pharmacist Clinical Goal(s): o Over the next 180 days, patient will work with PharmD and providers to achieve LDL goal < 100 or prevent LDL from increaseing . Current regimen:  o Flaxseed Oil 1400mg  daily . Interventions: o Discussed LDL goal o Discuss statin as an option to get LDL <100 . Patient self care activities - Over the next 180 days, patient will: o Maintain cholesterol medication regimen.  o Consider statin therapy if LDL remains >100  Pre-Diabetes Lab Results  Component Value Date/Time   HGBA1C 5.9 10/14/2020 09:31 AM   HGBA1C 6.2 11/21/2019 01:29 PM   . Pharmacist Clinical Goal(s): o Over the next 180 days, patient will work with PharmD and providers to maintain A1c goal <6.5% . Current regimen:  o Diet and exercise management   . Interventions: o Discussed a1c goal . Patient self care activities - Over the next  180 days, patient will: o Maintain a1c <6.5%  BPH:  . Pharmacist Clinical Goal(s): o Over the next 180 days, patient will work with PharmD and providers to maintain control of nocturia while minimizing side effects to medication . Current regimen:  o Doxazosin 4mg  - take 1 tablet at bedtime . Interventions: o Reminded patient to take at night to lower daytime dizziness . Patient self care activities - Over the next 180 days, patient will: o Continue current regimen  Health Maintenance  . Pharmacist Clinical Goal(s) o Over the next 180 days, patient will work with PharmD and providers to complete health maintenance screenings/vaccinations . Interventions: o Discussed Shingrix Vaccine series . Patient self care activities - Over the next 180 days, patient will: o Complete Shingrix vaccine series  Medication management . Pharmacist Clinical Goal(s): o Over the next 180 days, patient will work with PharmD and providers to maintain optimal medication adherence . Current pharmacy: 11/23/2019 . Interventions o Comprehensive medication review performed. o Continue current medication management strategy . Patient self care activities - Over the next 180 days, patient will: o Focus on medication adherence by filling and taking medications appropriately o Take medications as prescribed o Report any questions or concerns to PharmD and/or provider(s)         The patient verbalized understanding of instructions, educational materials, and care plan provided today and declined offer to receive copy of patient instructions, educational materials, and care plan.   Telephone follow up appointment with  care management team member scheduled for:  6 months Henrene Pastor, PharmD Clinical Pharmacist [facility]

## 2021-01-20 ENCOUNTER — Inpatient Hospital Stay: Payer: Medicare Other | Admitting: Family

## 2021-01-20 ENCOUNTER — Inpatient Hospital Stay: Payer: Medicare Other | Attending: Internal Medicine

## 2021-02-08 ENCOUNTER — Other Ambulatory Visit: Payer: Self-pay | Admitting: Internal Medicine

## 2021-04-18 ENCOUNTER — Other Ambulatory Visit: Payer: Self-pay

## 2021-04-18 ENCOUNTER — Ambulatory Visit (INDEPENDENT_AMBULATORY_CARE_PROVIDER_SITE_OTHER): Payer: Medicare Other | Admitting: Internal Medicine

## 2021-04-18 ENCOUNTER — Encounter: Payer: Self-pay | Admitting: Internal Medicine

## 2021-04-18 VITALS — BP 152/80 | HR 60 | Temp 97.9°F | Resp 18 | Ht 67.0 in | Wt 198.0 lb

## 2021-04-18 DIAGNOSIS — I10 Essential (primary) hypertension: Secondary | ICD-10-CM

## 2021-04-18 DIAGNOSIS — F4321 Adjustment disorder with depressed mood: Secondary | ICD-10-CM | POA: Diagnosis not present

## 2021-04-18 DIAGNOSIS — E785 Hyperlipidemia, unspecified: Secondary | ICD-10-CM

## 2021-04-18 DIAGNOSIS — R739 Hyperglycemia, unspecified: Secondary | ICD-10-CM | POA: Diagnosis not present

## 2021-04-18 LAB — CBC WITH DIFFERENTIAL/PLATELET
Basophils Absolute: 0 10*3/uL (ref 0.0–0.1)
Basophils Relative: 0.4 % (ref 0.0–3.0)
Eosinophils Absolute: 0 10*3/uL (ref 0.0–0.7)
Eosinophils Relative: 0.4 % (ref 0.0–5.0)
HCT: 42.1 % (ref 39.0–52.0)
Hemoglobin: 14.4 g/dL (ref 13.0–17.0)
Lymphocytes Relative: 37.9 % (ref 12.0–46.0)
Lymphs Abs: 2.5 10*3/uL (ref 0.7–4.0)
MCHC: 34.3 g/dL (ref 30.0–36.0)
MCV: 87.9 fl (ref 78.0–100.0)
Monocytes Absolute: 0.5 10*3/uL (ref 0.1–1.0)
Monocytes Relative: 6.8 % (ref 3.0–12.0)
Neutro Abs: 3.6 10*3/uL (ref 1.4–7.7)
Neutrophils Relative %: 54.5 % (ref 43.0–77.0)
Platelets: 205 10*3/uL (ref 150.0–400.0)
RBC: 4.79 Mil/uL (ref 4.22–5.81)
RDW: 13.5 % (ref 11.5–15.5)
WBC: 6.6 10*3/uL (ref 4.0–10.5)

## 2021-04-18 LAB — BASIC METABOLIC PANEL
BUN: 32 mg/dL — ABNORMAL HIGH (ref 6–23)
CO2: 27 mEq/L (ref 19–32)
Calcium: 9.1 mg/dL (ref 8.4–10.5)
Chloride: 103 mEq/L (ref 96–112)
Creatinine, Ser: 1.19 mg/dL (ref 0.40–1.50)
GFR: 57.54 mL/min — ABNORMAL LOW (ref >60.00)
Glucose, Bld: 100 mg/dL — ABNORMAL HIGH (ref 70–99)
Potassium: 4.4 mEq/L (ref 3.5–5.1)
Sodium: 138 mEq/L (ref 135–145)

## 2021-04-18 NOTE — Progress Notes (Signed)
Subjective:    Patient ID: Timothy Stone., male    DOB: 09-17-1940, 80 y.o.   MRN: [id number]  DOS:  04/18/2021 Type of visit - description: Routine checkup Today we talk about HTN, hyperlipidemia. He also informed me that he lost one of his children (son) unexpectedly 4 days ago.    Review of Systems Good med compliance. 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 Liliane Shi   DVT (deep venous thrombosis) ([facility]) 2005   no know triggers per pt   HTN (hypertension)    dx in the 70s   OSA (obstructive sleep apnea)    on CPAP   Recurrent UTI    Umbilical hernia    no surgery   Vitamin D deficiency     Past Surgical History:  Procedure Laterality Date   PROSTATE SURGERY  2003   TUNA around 2003    Allergies as of 04/18/2021       Reactions   Hydrocodone Shortness Of Breath   Oxycodone    Latex Hives        Medication List        Accurate as of April 18, 2021 11:59 PM. If you have any questions, ask your nurse or doctor.          STOP taking these medications    Pfizer-BioNTech COVID-19 Vacc 30 MCG/0.3ML injection Generic drug: COVID-19 mRNA vaccine Proofreader) Stopped by: Willow Ora, MD       TAKE these medications    acetaminophen 500 MG tablet Commonly known as: TYLENOL Take 500 mg by mouth as needed for mild pain or headache.   bisoprolol-hydrochlorothiazide 5-6.25 MG tablet Commonly known as: ZIAC TAKE 1 TABLET BY MOUTH  DAILY OKAY TO TAKE A SECOND TABLET IF BP MORE THAN  140/85   cyclobenzaprine 10 MG tablet Commonly known as: FLEXERIL Take 1 tablet (10 mg total) by mouth at bedtime as needed for muscle spasms.   doxazosin 4 MG tablet Commonly known as: CARDURA Take 1 tablet (4 mg total) by mouth  at bedtime.   Eliquis 2.5 MG Tabs tablet Generic drug: apixaban TAKE 1 TABLET BY MOUTH  TWICE DAILY   Flaxseed Oil Oil Take 1,400 mg by mouth daily.   fluticasone 50 MCG/ACT nasal spray Commonly known as: FLONASE Place 2 sprays into both nostrils daily as needed for allergies or rhinitis.   OVER THE COUNTER MEDICATION Take 1 tablet by mouth daily. Prostate Plus Health Complex w/ Cran-Max-Cranberry 1 daily   Vitamin D3 50 MCG (2000 UT) Tabs Take 2,000 Units by mouth daily.           Objective:   Physical Exam BP (!) 152/80 (BP Location: Left Arm, Patient Position: Sitting, Cuff Size: Normal)   Pulse 60   Temp 97.9 F (36.6 C) (Oral)   Resp 18   Ht 5\' 7"  (1.702 m)   Wt 198 lb (89.8 kg)   SpO2 97%   BMI 31.01 kg/m  General:   Well developed, NAD, BMI noted. HEENT:  Normocephalic . Face symmetric, atraumatic Lungs:  CTA B Normal respiratory effort, no intercostal retractions, no accessory muscle use. Heart: RRR,  no murmur.  Lower extremities: no pretibial edema bilaterally  Skin: Not pale. Not jaundice Neurologic:  alert & oriented X3.  Speech normal, gait appropriate for age and unassisted Psych--  Cognition and judgment appear intact.  Cooperative with normal attention span and concentration.  Behavior appropriate. No anxious or depressed appearing.      Assessment    Assessment: Prediabetes HTN [place]: Saw cardiology 2019, Holter: 80% QRS with PVCs.  Echo was done, see report BPH, recurrent UTIs, chronic cystitis OSA, on CPAP Umbilical hernia Actinic keratosis; no derm eval recently as off 02/2018 Tinnitus  H/o vitamin D deficiency H/o R leg  DVT, 2005 and 2020, unprovoked, hematology Rx lifelong anticoagulant   PLAN: Prediabetes: Last A1c very good.  Recheck on RTC HTN: Ambulatory BPs checked frequently, run between 111/63 and 141/80.  Occasionally systolic BPs in the 100s.  When that happens, he delays for few hours his BP meds.  I agree  with that approach, in fact if the BP remain low he could skip a dose and see how he does; check labs  Dyslipidemia: He is CV RF is high (mostly from age) , extensive d/w pt regards the issue >>> he is aware that statins could decrease his risk of future events. PVCs: Saw cardiology 11/05/2020, note reviewed, no intervention needed, next visit 1 year Grieving: Lost a son 4 days ago, listening therapy provided. he knows to reach out for help if needed. RTC 6 months CPX   This visit occurred during the SARS-CoV-2 public health emergency.  Safety protocols were in place, including screening questions prior to the visit, additional usage of staff PPE, and extensive cleaning of exam room while observing appropriate contact time as indicated for disinfecting solutions.

## 2021-04-18 NOTE — Patient Instructions (Addendum)
Check the  blood pressure as you are doing.   BP GOAL is between 110/65 and  135/85. If it is consistently higher or lower, let me know   GO TO THE LAB : Get the blood work     GO TO THE FRONT DESK, PLEASE SCHEDULE YOUR APPOINTMENTS Come back for   a physical exam in 6 months 

## 2021-04-19 ENCOUNTER — Encounter: Payer: Self-pay | Admitting: Internal Medicine

## 2021-04-19 NOTE — Assessment & Plan Note (Addendum)
Prediabetes: Last A1c very good.  Recheck on RTC HTN: Ambulatory BPs checked frequently, run between 111/63 and 141/80.  Occasionally systolic BPs in the 100s.  When that happens, he delays for few hours his BP meds.  I agree with that approach, in fact if the BP remain low he could skip a dose and see how he does; check labs  Dyslipidemia: He is CV RF is high (mostly from age) , extensive d/w pt regards the issue >>> he is aware that statins could decrease his risk of future events. PVCs: Saw cardiology 11/05/2020, note reviewed, no intervention needed, next visit 1 year Grieving: Lost a son 4 days ago, listening therapy provided. he knows to reach out for help if needed. RTC 6 months CPX

## 2021-06-25 ENCOUNTER — Other Ambulatory Visit: Payer: Self-pay | Admitting: Hematology & Oncology

## 2021-06-25 DIAGNOSIS — I824Z1 Acute embolism and thrombosis of unspecified deep veins of right distal lower extremity: Secondary | ICD-10-CM

## 2021-06-25 NOTE — Telephone Encounter (Signed)
Pt needs a follow up appointment prior to next refill.

## 2021-06-30 ENCOUNTER — Ambulatory Visit (INDEPENDENT_AMBULATORY_CARE_PROVIDER_SITE_OTHER): Payer: Medicare Other | Admitting: Pharmacist

## 2021-06-30 DIAGNOSIS — J3089 Other allergic rhinitis: Secondary | ICD-10-CM

## 2021-06-30 DIAGNOSIS — E785 Hyperlipidemia, unspecified: Secondary | ICD-10-CM

## 2021-06-30 DIAGNOSIS — R739 Hyperglycemia, unspecified: Secondary | ICD-10-CM

## 2021-06-30 DIAGNOSIS — I10 Essential (primary) hypertension: Secondary | ICD-10-CM

## 2021-06-30 NOTE — Chronic Care Management (AMB) (Signed)
Chronic Care Management Pharmacy Note  06/30/2021 Name:  Timothy Stone. MRN:  [id number] DOB:  12-21-1940  Subjective: [name]. is an 81 y.o. year old male who is a primary patient of Paz, Alda Berthold, MD.  The CCM team was consulted for assistance with disease management and care coordination needs.    Engaged with patient by telephone for follow up visit in response to provider referral for pharmacy case management and/or care coordination services.   Consent to Services:  The patient was given information about Chronic Care Management services, agreed to services, and gave verbal consent prior to initiation of services.  Please see initial visit note for detailed documentation.   Patient Care Team: Colon Branch, MD as PCP - General Tamala Julian Lynnell Dike, MD as PCP - Cardiology (Cardiology) Vickie Epley, MD as PCP - Electrophysiology (Cardiology) Ceasar Mons, MD as Consulting Physician (Urology) Ralene Ok, MD as Consulting Physician (General Surgery) Marica Otter, [place] as Consulting Physician (Optometry) Laurance Flatten Velora Heckler., MD as Consulting Physician (Otolaryngology) Cherre Robins, PharmD (Pharmacist)  Recent office visits: 04/18/2021 - PCP (Dr Larose Kells) Routine check up. Son died 4 days earlier unexpectantly.No medication changes.  10/14/2020 - PCP (Dr Larose Kells) - complete physical; patient c/o low BP and dizziness. Pt was taking cardura in am - Dr Larose Kells suggested taking in pm and monitor BP; (BP goal <140/85)  Recent consult visits: 11/05/2020- Cardio( Dr.Lambert) f/u PVCs; No indication at this time for suppression given he is asymptomatic and EF remains normal. Plan for 1 year follow up with echo at that appt  Hospital visits: None in previous 6 months  Objective:  Lab Results  Component Value Date   CREATININE 1.19 04/18/2021   CREATININE 1.21 07/22/2020   CREATININE 1.13 (H) 07/03/2020    Lab Results  Component Value Date   HGBA1C 5.9  10/14/2020   Last diabetic Eye exam: No results found for: HMDIABEYEEXA  Last diabetic Foot exam: No results found for: HMDIABFOOTEX      Component Value Date/Time   CHOL 164 10/14/2020 0931   TRIG 144.0 10/14/2020 0931   HDL 33.20 (L) 10/14/2020 0931   CHOLHDL 5 10/14/2020 0931   VLDL 28.8 10/14/2020 0931   LDLCALC 102 (H) 10/14/2020 0931   LDLDIRECT 113.0 02/28/2019 1306    Hepatic Function Latest Ref Rng & Units 07/22/2020 01/19/2020 08/22/2019  Total Protein 6.5 - 8.1 g/dL 7.1 7.1 7.5  Albumin 3.5 - 5.0 g/dL 4.3 4.4 4.7  AST 15 - 41 U/L 16 16 14(L)  ALT 0 - 44 U/L _0 Alk Phosphatase 38 - 126 U/L 51 56 53  Total Bilirubin 0.3 - 1.2 mg/dL 0.6 0.6 0.8  Bilirubin, Direct 0.0 - 0.3 mg/dL - - -    Lab Results  Component Value Date/Time   TSH 2.114 03/21/2020 03:55 PM   TSH 2.09 03/02/2018 09:44 AM   TSH 2.32 02/15/2017 08:38 AM    CBC Latest Ref Rng & Units 04/18/2021 07/22/2020 07/03/2020  WBC 4.0 - 10.5 K/uL 6.6 6.2 6.6  Hemoglobin 13.0 - 17.0 g/dL 14.4 14.9 14.3  Hematocrit 39.0 - 52.0 % 42.1 44.4 42.3  Platelets 150.0 - 400.0 K/uL 205.0 176 196    Lab Results  Component Value Date/Time   VD25OH 44.15 10/14/2020 09:31 AM   VD25OH 58 05/24/2009 09:48 PM    Clinical ASCVD: No  The ASCVD Risk score (Arnett DK, et al., 2019) failed to calculate for the  following reasons:   The 2019 ASCVD risk score is only valid for ages 72 to 26     Social History   Tobacco Use  Smoking Status Never  Smokeless Tobacco Never   BP Readings from Last 3 Encounters:  04/18/21 (!) 152/80  11/05/20 122/76  10/14/20 (!) 147/82   Pulse Readings from Last 3 Encounters:  04/18/21 60  11/05/20 60  10/14/20 (!) 55   Wt Readings from Last 3 Encounters:  04/18/21 198 lb (89.8 kg)  11/05/20 196 lb (88.9 kg)  10/14/20 196 lb 8 oz (89.1 kg)    Assessment: Review of patient past medical history, allergies, medications, health status, including review of consultants reports,  laboratory and other test data, was performed as part of comprehensive evaluation and provision of chronic care management services.   SDOH:  (Social Determinants of Health) assessments and interventions performed:  SDOH Interventions    Flowsheet Row Most Recent Value  SDOH Interventions   Financial Strain Interventions Other (Comment)  [screened for PAP for Eliquis. Does not meet income criteria for PAP.]  Physical Activity Interventions Intervention Not Indicated  Transportation Interventions Intervention Not Indicated       CCM Care Plan  Allergies  Allergen Reactions   Hydrocodone Shortness Of Breath   Oxycodone    Latex Hives    Medications Reviewed Today     Reviewed by Cherre Robins, PharmD (Pharmacist) on 06/30/21 at [place] List Status: <None>   Medication Order Taking? Sig Documenting Provider Last Dose Status Informant  acetaminophen (TYLENOL) 500 MG tablet [id number] Yes Take 500 mg by mouth as needed for mild pain or headache. [provider] Taking Active Self           Med Note [facility] Algis Downs, Marca Ancona Mar 28, 2020  9:17 AM)    bisoprolol-hydrochlorothiazide [facility]) 5-6.25 MG tablet [id number] Yes TAKE 1 TABLET BY MOUTH  DAILY OKAY TO TAKE A SECOND TABLET IF BP MORE THAN  140/85 Colon Branch, MD Taking Active   Cholecalciferol (VITAMIN D3) 2000 units TABS [id number] Yes Take 2,000 Units by mouth daily.  [provider] Taking Active Self  cyclobenzaprine (FLEXERIL) 10 MG tablet [id number] No Take 1 tablet (10 mg total) by mouth at bedtime as needed for muscle spasms.  Patient not taking: No sig reported   Colon Branch, MD Not Taking Active   doxazosin (CARDURA) 4 MG tablet [id number] Yes Take 1 tablet (4 mg total) by mouth at bedtime. Colon Branch, MD Taking Active Self  ELIQUIS 2.5 MG TABS tablet [id number] Yes TAKE 1 TABLET BY MOUTH  TWICE DAILY Volanda Napoleon, MD Taking Active   Flaxseed Oil OIL [id number] Yes Take 1,400 mg by mouth daily.   [provider] Taking Active Self  fluticasone (FLONASE) 50 MCG/ACT nasal spray [id number] Yes Place 2 sprays into both nostrils daily as needed for allergies or rhinitis. Colon Branch, MD Taking Active   loratadine (CLARITIN) 10 MG tablet [id number] Yes Take 10 mg by mouth daily. [provider] Taking Active   OVER THE [place] [id number] Yes Take 1 tablet by mouth daily. Prostate Plus Health Complex w/ Cran-Max-Cranberry 1 daily [provider] Taking Active Self            Patient Active Problem List   Diagnosis Date Noted   DVT (deep venous thrombosis) ([place]) 08/21/2019   PVC (premature ventricular contraction) 06/29/2018   PCP NOTES >>>>>>>>>>>>>>>>>>>> 24/58/0998   Umbilical  hernia 11/01/2012   Hyperglycemia 01/20/2012   Annual physical exam 08/10/2011   BPH (benign prostatic hyperplasia)    Recurrent UTI    HYPERTRIGLYCERIDEMIA 11/16/2008   Obstructive sleep apnea 03/07/2008   Essential hypertension 02/21/2007   ALLERGIC RHINITIS 02/21/2007    Immunization History  Administered Date(s) Administered   Fluad Quad(high Dose 65+) 05/24/2019, 06/12/2020   Influenza Split 06/07/2014   Influenza Whole 07/28/2007, 05/26/2010   Influenza, High Dose Seasonal PF 07/02/2015, 05/27/2016, 06/17/2017, 06/16/2018   PFIZER(Purple Top)SARS-COV-2 Vaccination 09/22/2019, 10/13/2019, 07/12/2020, 01/16/2021   Pneumococcal Conjugate-13 02/16/2014   Pneumococcal Polysaccharide-23 05/18/2008, 03/02/2018   Td 09/08/2003   Tetanus 02/16/2014   Zoster, Live 05/26/2010    Conditions to be addressed/monitored: HTN, HLD and BPH; pre Diabetes; recurrent DVT; vitamin D deficiency  Care Plan : General Pharmacy (Adult)  Updates made by Cherre Robins, PHARMD since 06/30/2021 12:00 AM     Problem: Provide education, support and care coordination for medication therapy and chronic conditions   Priority: High  Onset Date: 12/26/2020  Note:   Current Barriers:   Chronic Disease Management support, education, and care coordination needs related to Hypertension, Hyperlipidemia, Pre-Diabetes, Recurrent DVT, BPH  Pharmacist Clinical Goal(s):  Over the next 180 days, patient will achieve adherence to monitoring guidelines and medication adherence to achieve therapeutic efficacy through collaboration with PharmD and provider.   Interventions: 1:1 collaboration with Colon Branch, MD regarding development and update of comprehensive plan of care as evidenced by provider attestation and co-signature Inter-disciplinary care team collaboration (see longitudinal plan of care) Comprehensive medication review performed; medication list updated in electronic medical record  Pre - Diabetes: Controlled; goal A1c < 6.5% current treatment: none Current glucose readings: doesn't check BG at home Current exercise: daily - usually 50 minutes at gym - 30 minutes on treadmill and 20 minutes of stretching No interventions - continue diet and exercise   Hypertension (goal <140/85): Improving; Current regimen:  Bisoprolol-hctc 5-6.25mg once each morning (will take additional 1/2 to 1 tablet depending on BP in afternoon) Patient reports that daytime dizziness has improved with taking doxazosin at night.  Checks BP at home twice a day. Usually is 130-140 / 70's in morning. If in afternoon BP is >140/85 he will take additional bisoprolol/HCTZ. Takes additional tablet about 10% of time which is less than previous visit 12/2020 when he was taking extra tablet about 50% of time Interventions: Discussed BP goal Maintain hypertension medication regimen.  Continue to check blood pressure 1 to 2 times daily  Hyperlipidemia - goal <100:  Not at goal  Lab Results  Component Value Date/Time   LDLCALC 102 (H) 10/14/2020 09:31 AM   LDLDIRECT 113.0 02/28/2019 01:06 PM  Current regimen:  Flaxseed Oil 1436m daily Interventions: Discussed LDL goal Discuss statin as an option to  get LDL <100; Patient is now open to trial of statin. Coordinating statin start with PCP. Continue to follow low saturated diet and exercise daily  BPH:  Current regimen:  Doxazosin 447m- take 1 tablet at bedtime Interventions: Reminded patient to take at night to lower daytime dizziness Continue current regimen  Health Maintenance:  Reviewed vaccination history and discussed benefits of annual flu, COVID booster and Shingrix vaccinations Patient to get the following vaccines at next provider appointment, local pharmacy or [place] flu vaccine and COVID booster Plan to get Shingrix in 2023.   Medication management Current pharmacy: [facility] Patient mentions that he is taking Claritin D or  Claritin for allergies. Not on his med list. He has history of PVCs Interventions Comprehensive medication review performed. Updated med list Reviewed refill history Discussed patient assistance for Eliquis when reaches coverage gap. Unfortunately patient's yearly income does not qualify for assistance.  Recommended he verify if he is taking Claritin or Claritin D. If he is taking decongestant then recommended he switch to plain Claritin as decongestant can increase heart rate and blood pressure.   Patient Goals/Self-Care Activities Over the next 180 days, patient will:  take medications as prescribed, check blood pressure daily, document, and provide at future appointments, and continue to exercise regularly  Follow Up Plan: Telephone follow up appointment with care management team member scheduled for:  6 months             Medication Assistance:  Discuss assistance for Eliquis when hits Medicare coverage gap but patient does not qualify based on income.   Patient's preferred pharmacy is:  [employer]  ([place]) - [place], [place] [facility] [address] [place] [place] [address] Phone: [phone] Fax:  [phone]  [id number] [address]: [phone]: [phone] [facility] [place] [address] [address]Phone: [phone]: [phone] Pt endorses 95% compliance  Follow Up:  Patient requests no follow-up at this time.  Plan: Telephone follow up appointment with care management team member scheduled for:   6 months  TCherre Robins PharmD Clinical Pharmacist [facility]

## 2021-06-30 NOTE — Patient Instructions (Signed)
Visit Information  PATIENT GOALS:  Goals Addressed             This Visit's Progress    Chronic Care Management Pharmacy Care Plan   On track    CARE PLAN ENTRY (see longitudinal plan of care for additional care plan information)  Current Barriers:  Chronic Disease Management support, education, and care coordination needs related to Hypertension, Hyperlipidemia, Pre-Diabetes, Hx of DVT, BPH   Hypertension BP Readings from Last 3 Encounters:  04/18/21 (!) 152/80  11/05/20 122/76  10/14/20 (!) 147/82  Pharmacist Clinical Goal(s): Over the next 180 days, patient will work with PharmD and providers to maintain BP goal <140/90 Current regimen:  Bisoprolol-hydrochlorothiazide 5-6.25mg  once each morning (will take additional 1/2 to 1 tablet depending on BP in afternoon) Interventions: Discussed blood pressure  goal Patient self care activities - Over the next 180 days, patient will: Maintain hypertension medication regimen.  Continue to check blood pressure twice a day and adjust bisoprolol hydrochlorothiazide according to Dr Leta Jungling recommendations  Hyperlipidemia Lab Results  Component Value Date/Time   LDLCALC 102 (H) 10/14/2020 09:31 AM   LDLDIRECT 113.0 02/28/2019 01:06 PM  Pharmacist Clinical Goal(s): Over the next 180 days, patient will work with PharmD and providers to achieve LDL goal < 100 or prevent LDL from increaseing Current regimen:  Flaxseed Oil 1400mg  daily Interventions: Discussed LDL goal Discuss statin as an option to get LDL <100 Patient self care activities - Over the next 180 days, patient will: Maintain cholesterol medication regimen.  We will send in prescription for statin therapy. Please start statin when you pick up. Will recheck cholesterol in February 2023.   Pre-Diabetes Lab Results  Component Value Date/Time   HGBA1C 5.9 10/14/2020 09:31 AM   HGBA1C 6.2 11/21/2019 01:29 PM  Pharmacist Clinical Goal(s): Over the next 180 days, patient will  work with PharmD and providers to maintain A1c goal <6.5% Current regimen:  Diet and exercise management   Interventions: Discussed a1c goal Patient self care activities - Over the next 180 days, patient will: Maintain a1c <6.5%  BPH:  Pharmacist Clinical Goal(s): Over the next 180 days, patient will work with PharmD and providers to maintain control of nocturia while minimizing side effects to medication Current regimen:  Doxazosin 4mg  - take 1 tablet at bedtime Interventions: Reminded patient to take at night to lower daytime dizziness Patient self care activities - Over the next 180 days, patient will: Continue current regimen  Health Maintenance:  Reviewed vaccination history and discussed benefits of annual flu, COVID booster and Shingrix vaccinations Patient to get the following vaccines at next provider appointment, local pharmacy or [facility]: annual flu vaccine and COVID booster Plan to get Shingrix in 2023.   Medication management Pharmacist Clinical Goal(s): Over the next 180 days, patient will work with PharmD and providers to maintain optimal medication adherence Current pharmacy: [facility] Interventions Comprehensive medication review performed. Continue current medication management strategy Patient self care activities - Over the next 180 days, patient will: Focus on medication adherence by filling and taking medications appropriately Take medications as prescribed Report any questions or concerns to PharmD and/or provider(s) Check to see if you are taking Claritin or Claritin D (D = decongestant) If you have the form with with a decongestant, I recommend you switch to just plain Claritin as the decongestant can increase your heart rate and blood pressure.          Patient verbalizes understanding of instructions  provided today and agrees to view in MyChart.   Telephone follow up appointment with care management team member  scheduled for: 6 months  Henrene Pastor, PharmD Clinical Pharmacist [facility]

## 2021-07-02 ENCOUNTER — Ambulatory Visit: Payer: Medicare Other

## 2021-07-02 ENCOUNTER — Encounter: Payer: Self-pay | Admitting: Internal Medicine

## 2021-07-02 ENCOUNTER — Other Ambulatory Visit: Payer: Self-pay

## 2021-07-02 ENCOUNTER — Ambulatory Visit (INDEPENDENT_AMBULATORY_CARE_PROVIDER_SITE_OTHER): Payer: Medicare Other

## 2021-07-02 ENCOUNTER — Ambulatory Visit: Payer: Medicare Other | Attending: Internal Medicine

## 2021-07-02 VITALS — BP 150/78 | HR 57 | Temp 97.5°F | Resp 16 | Ht 67.0 in | Wt 200.4 lb

## 2021-07-02 DIAGNOSIS — Z23 Encounter for immunization: Secondary | ICD-10-CM

## 2021-07-02 DIAGNOSIS — Z0000 Encounter for general adult medical examination without abnormal findings: Secondary | ICD-10-CM | POA: Diagnosis not present

## 2021-07-02 NOTE — Progress Notes (Signed)
Subjective:   Timothy Stone. is a 80 y.o. male who presents for Medicare Annual/Subsequent preventive examination.  Review of Systems     Cardiac Risk Factors include: advanced age (>53men, >73 women);male gender;hypertension;dyslipidemia;obesity (BMI >30kg/m2)     Objective:    Today's Vitals   07/02/21 1551  BP: (!) 150/78  Pulse: (!) 57  Resp: 16  Temp: (!) 97.5 F (36.4 C)  TempSrc: Temporal  SpO2: 99%  Weight: 200 lb 6.4 oz (90.9 kg)  Height: 5\' 7"  (1.702 m)  PainSc: 4    Body mass index is 31.39 kg/m.  Advanced Directives 07/02/2021 07/22/2020 01/19/2020 08/22/2019 08/11/2019 07/21/2019 04/29/2018  Does Patient Have a Medical Advance Directive? Yes No No No No No No  Type of 05/01/2018 of [place];Living will - - - - - -  Copy of Healthcare Power of Attorney in Chart? No - copy requested - - - - - -  Would patient like information on creating a medical advance directive? - - No - Patient declined No - Patient declined No - Patient declined No - Patient declined No - Patient declined    Current Medications (verified) Outpatient Encounter Medications as of 07/02/2021  Medication Sig   acetaminophen (TYLENOL) 500 MG tablet Take 500 mg by mouth as needed for mild pain or headache.   bisoprolol-hydrochlorothiazide (ZIAC) 5-6.25 MG tablet TAKE 1 TABLET BY MOUTH  DAILY OKAY TO TAKE A SECOND TABLET IF BP MORE THAN  140/85   Cholecalciferol (VITAMIN D3) 2000 units TABS Take 2,000 Units by mouth daily.    doxazosin (CARDURA) 4 MG tablet Take 1 tablet (4 mg total) by mouth at bedtime.   ELIQUIS 2.5 MG TABS tablet TAKE 1 TABLET BY MOUTH  TWICE DAILY   Flaxseed Oil OIL Take 1,400 mg by mouth daily.    fluticasone (FLONASE) 50 MCG/ACT nasal spray Place 2 sprays into both nostrils daily as needed for allergies or rhinitis.   loratadine (CLARITIN) 10 MG tablet Take 10 mg by mouth daily.   OVER THE COUNTER MEDICATION Take 1 tablet by mouth daily. Prostate  Plus Health Complex w/ Cran-Max-Cranberry 1 daily   cyclobenzaprine (FLEXERIL) 10 MG tablet Take 1 tablet (10 mg total) by mouth at bedtime as needed for muscle spasms. (Patient not taking: No sig reported)   No facility-administered encounter medications on file as of 07/02/2021.    Allergies (verified) Hydrocodone, Oxycodone, and Latex   History: Past Medical History:  Diagnosis Date   Actinic keratosis    hyperplastic aktinic keratosis forehead s/p surgery Dr 07/04/2021   BPH (benign prostatic hyperplasia)    s/p TUNA   Chronic cystitis    Dr. Park Liter   DVT (deep venous thrombosis) (HCC) 2005   no know triggers per pt   HTN (hypertension)    dx in the 70s   OSA (obstructive sleep apnea)    on CPAP   Recurrent UTI    Umbilical hernia    no surgery   Vitamin D deficiency    Past Surgical History:  Procedure Laterality Date   PROSTATE SURGERY  2003   TUNA around 2003   Family History  Problem Relation Age of Onset   Diabetes Mother        M, sister   Heart attack Father 12   Breast cancer Sister    Rectal cancer Sister 52       @ time of her first Cscope   Lung cancer Brother 23  smoker    Prostate cancer Neg Hx    Social History   Socioeconomic History   Marital status: Married    Spouse name: Not on file   Number of children: 4   Years of education: Not on file   Highest education level: Not on file  Occupational History   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Music therapist, Art gallery manager, 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  Tobacco Use   Smoking status: Never   Smokeless tobacco: Never  Vaping Use   Vaping Use: Never used  Substance and Sexual Activity   Alcohol use: Yes    Alcohol/week: 0.0 standard drinks    Comment: 2x per week   Drug use: No   Sexual activity: Not Currently  Other Topics Concern   Not on file  Social History Narrative   Lives w/ wife   Lost a son 04/2021         Social Determinants of Health   Financial Resource Strain:  Low Risk    Difficulty of Paying Living Expenses: Not very hard  Food Insecurity: No Food Insecurity   Worried About Programme researcher, broadcasting/film/video in the Last Year: Never true   Ran Out of Food in the Last Year: Never true  Transportation Needs: No Transportation Needs   Lack of Transportation (Medical): No   Lack of Transportation (Non-Medical): No  Physical Activity: Sufficiently Active   Days of Exercise per Week: 5 days   Minutes of Exercise per Session: 50 min  Stress: No Stress Concern Present   Feeling of Stress : Not at all  Social Connections: Moderately Isolated   Frequency of Communication with Friends and Family: Once a week   Frequency of Social Gatherings with Friends and Family: Once a week   Attends Religious Services: More than 4 times per year   Active Member of [employer] or Organizations: No   Attends Engineer, structural: Never   Marital Status: Married    Tobacco Counseling Counseling given: Not Answered   Clinical Intake:  Pre-visit preparation completed: Yes  Pain : 0-10 Pain Score: 4  Pain Type: Chronic pain Pain Location: Elbow Pain Orientation: Right Pain Onset: More than a month ago Pain Frequency: Constant     BMI - recorded: 31.39 Nutritional Status: BMI > 30  Obese Nutritional Risks: None Diabetes: No  How often do you need to have someone help you when you read instructions, pamphlets, or other written materials from your doctor or pharmacy?: 1 - Never  Diabetic?No  Interpreter Needed?: No  Information entered by :: Thomasenia Sales LPN   Activities of Daily Living In your present state of health, do you have any difficulty performing the following activities: 07/02/2021 10/14/2020  Hearing? N N  Vision? N N  Difficulty concentrating or making decisions? N N  Walking or climbing stairs? N N  Dressing or bathing? N N  Doing errands, shopping? N N  Preparing Food and eating ? N -  Using the Toilet? N -  In the past six months, have  you accidently leaked urine? N -  Do you have problems with loss of bowel control? N -  Managing your Medications? N -  Managing your Finances? N -  Housekeeping or managing your Housekeeping? N -  Some recent data might be hidden    Patient Care Team: Wanda Plump, MD as PCP - General Lyn Records, MD as PCP - Cardiology (Cardiology) Lanier Prude, MD as PCP - Electrophysiology (Cardiology) Rhoderick Moody  Clifton Custard, MD as Consulting Physician (Urology) Axel Filler, MD as Consulting Physician (General Surgery) Blima Ledger, OD as Consulting Physician (Optometry) Christell Constant Rowe Robert., MD as Consulting Physician (Otolaryngology) Henrene Pastor, PharmD (Pharmacist)  Indicate any recent Medical Services you may have received from other than [facility] providers in the past year (date may be approximate).     Assessment:   This is a routine wellness examination for [facility].  Hearing/Vision screen Hearing Screening - Comments:: C/o mild hearing loss Vision Screening - Comments:: Wears glasses Last eye exam-over a year ago  Dietary issues and exercise activities discussed: Current Exercise Habits: Home exercise routine, Type of exercise: strength training/weights;walking, Time (Minutes): 50, Frequency (Times/Week): 6, Weekly Exercise (Minutes/Week): 300, Intensity: Mild, Exercise limited by: None identified   Goals Addressed             This Visit's Progress    Patient Stated   On track    Maintain health active lifestyle.        Depression Screen PHQ 2/9 Scores 07/02/2021 04/18/2021 10/14/2020 07/21/2019 05/24/2019 04/29/2018 04/26/2017  PHQ - 2 Score 0 0 0 0 0 0 0    Fall Risk Fall Risk  07/02/2021 04/18/2021 10/14/2020 07/21/2019 05/24/2019  Falls in the past year? 0 0 0 0 0  Comment - - - - -  Number falls in past yr: 0 0 0 0 -  Injury with Fall? 0 0 0 0 -  Follow up - Falls evaluation completed Falls evaluation completed - Falls evaluation completed    FALL RISK  PREVENTION PERTAINING TO THE HOME:  Any stairs in or around the home? Yes  If so, are there any without handrails? No  Home free of loose throw rugs in walkways, pet beds, electrical cords, etc? Yes  Adequate lighting in your home to reduce risk of falls? Yes   ASSISTIVE DEVICES UTILIZED TO PREVENT FALLS:  Life alert? No  Use of a cane, walker or w/c? No  Grab bars in the bathroom? Yes  Shower chair or bench in shower? Yes  Elevated toilet seat or a handicapped toilet? No   TIMED UP AND GO:  Was the test performed? Yes .  Length of time to ambulate 10 feet: 11 sec.   Gait steady and fast without use of assistive device  Cognitive Function:Normal cognitive status assessed by direct observation by this Nurse Health Advisor. No abnormalities found.   MMSE - Mini Mental State Exam 04/26/2017 04/06/2016  Orientation to time 5 5  Orientation to Place 5 5  Registration 3 3  Attention/ Calculation 5 5  Recall 3 3  Language- name 2 objects 2 2  Language- repeat 1 1  Language- follow 3 step command 3 3  Language- read & follow direction 1 1  Write a sentence 1 1  Copy design 1 1  Total score 30 30     6CIT Screen 07/02/2021  What Year? 0 points  What month? 0 points  What time? 0 points  Count back from 20 0 points  Months in reverse 0 points  Repeat phrase 0 points  Total Score 0    Immunizations Immunization History  Administered Date(s) Administered   Fluad Quad(high Dose 65+) 05/24/2019, 06/12/2020, 07/02/2021   Influenza Split 06/07/2014   Influenza Whole 07/28/2007, 05/26/2010   Influenza, High Dose Seasonal PF 07/02/2015, 05/27/2016, 06/17/2017, 06/16/2018   PFIZER(Purple Top)SARS-COV-2 Vaccination 09/22/2019, 10/13/2019, 07/12/2020, 01/16/2021   Pfizer Covid-19 Vaccine Bivalent Booster 38yrs & up 07/02/2021   Pneumococcal  Conjugate-13 02/16/2014   Pneumococcal Polysaccharide-23 05/18/2008, 03/02/2018   Td 09/08/2003   Tetanus 02/16/2014   Zoster, Live  05/26/2010    TDAP status: Up to date  Flu Vaccine status: Up to date  Pneumococcal vaccine status: Up to date  Covid-19 vaccine status: Completed vaccines  Qualifies for Shingles Vaccine? Yes   Zostavax completed Yes   Shingrix Completed?: No.    Education has been provided regarding the importance of this vaccine. Patient has been advised to call insurance company to determine out of pocket expense if they have not yet received this vaccine. Advised may also receive vaccine at local pharmacy or Health Dept. Verbalized acceptance and understanding.  Screening Tests Health Maintenance  Topic Date Due   Zoster Vaccines- Shingrix (1 of 2) Never done   COLONOSCOPY (Pts 45-79yrs Insurance coverage will need to be confirmed)  03/30/2022   TETANUS/TDAP  02/17/2024   Pneumonia Vaccine 79+ Years old  Completed   INFLUENZA VACCINE  Completed   COVID-19 Vaccine  Completed   HPV VACCINES  Aged Out    Health Maintenance  Health Maintenance Due  Topic Date Due   Zoster Vaccines- Shingrix (1 of 2) Never done    Colorectal cancer screening: No longer required.   Lung Cancer Screening: (Low Dose CT Chest recommended if Age 98-80 years, 30 pack-year currently smoking OR have quit w/in 15years.) does not qualify.     Additional Screening:  Hepatitis C Screening: does not qualify  Vision Screening: Recommended annual ophthalmology exams for early detection of glaucoma and other disorders of the eye. Is the patient up to date with their annual eye exam?  No  Who is the provider or what is the name of the office in which the patient attends annual eye exams? unsure Patient plans to make an appt soon  Dental Screening: Recommended annual dental exams for proper oral hygiene  Community Resource Referral / Chronic Care Management: CRR required this visit?  No   CCM required this visit?  No      Plan:     I have personally reviewed and noted the following in the patient's chart:    Medical and social history Use of alcohol, tobacco or illicit drugs  Current medications and supplements including opioid prescriptions. Patient is not currently taking opioid prescriptions. Functional ability and status Nutritional status Physical activity Advanced directives List of other physicians Hospitalizations, surgeries, and ER visits in previous 12 months Vitals Screenings to include cognitive, depression, and falls Referrals and appointments  In addition, I have reviewed and discussed with patient certain preventive protocols, quality metrics, and best practice recommendations. A written personalized care plan for preventive services as well as general preventive health recommendations were provided to patient.   Patient to access avs on mychart.  Roanna Raider, LPN   39/11/90  Nurse Health Advisor  Nurse Notes: None

## 2021-07-02 NOTE — Patient Instructions (Signed)
Timothy Stone , Thank you for taking time to come for your Medicare Wellness Visit. I appreciate your ongoing commitment to your health goals. Please review the following plan we discussed and let me know if I can assist you in the future.   Screening recommendations/referrals: Colonoscopy: Completed 03/30/2017 Recommended yearly ophthalmology/optometry visit for glaucoma screening and checkup Recommended yearly dental visit for hygiene and checkup  Vaccinations: Influenza vaccine: Up to date Pneumococcal vaccine: Up to date Tdap vaccine: Up to date-Due-02/17/2024 Shingles vaccine: Discuss with pharmacy   Covid-19: Up to date  Advanced directives: Please bring a copy of Living Will and/or Healthcare Power of Attorney for your chart.   Conditions/risks identified: See problem list  Next appointment: Follow up in one year for your annual wellness visit. 07/06/2022 @ 3:00.  Preventive Care 17 Years and Older, Male Preventive care refers to lifestyle choices and visits with your health care provider that can promote health and wellness. What does preventive care include? A yearly physical exam. This is also called an annual well check. Dental exams once or twice a year. Routine eye exams. Ask your health care provider how often you should have your eyes checked. Personal lifestyle choices, including: Daily care of your teeth and gums. Regular physical activity. Eating a healthy diet. Avoiding tobacco and drug use. Limiting alcohol use. Practicing safe sex. Taking low doses of aspirin every day. Taking vitamin and mineral supplements as recommended by your health care provider. What happens during an annual well check? The services and screenings done by your health care provider during your annual well check will depend on your age, overall health, lifestyle risk factors, and family history of disease. Counseling  Your health care provider may ask you questions about your: Alcohol  use. Tobacco use. Drug use. Emotional well-being. Home and relationship well-being. Sexual activity. Eating habits. History of falls. Memory and ability to understand (cognition). Work and work Astronomer. Screening  You may have the following tests or measurements: Height, weight, and BMI. Blood pressure. Lipid and cholesterol levels. These may be checked every 5 years, or more frequently if you are over 60 years old. Skin check. Lung cancer screening. You may have this screening every year starting at age 65 if you have a 30-pack-year history of smoking and currently smoke or have quit within the past 15 years. Fecal occult blood test (FOBT) of the stool. You may have this test every year starting at age 53. Flexible sigmoidoscopy or colonoscopy. You may have a sigmoidoscopy every 5 years or a colonoscopy every 10 years starting at age 51. Prostate cancer screening. Recommendations will vary depending on your family history and other risks. Hepatitis C blood test. Hepatitis B blood test. Sexually transmitted disease (STD) testing. Diabetes screening. This is done by checking your blood sugar (glucose) after you have not eaten for a while (fasting). You may have this done every 1-3 years. Abdominal aortic aneurysm (AAA) screening. You may need this if you are a current or former smoker. Osteoporosis. You may be screened starting at age 84 if you are at high risk. Talk with your health care provider about your test results, treatment options, and if necessary, the need for more tests. Vaccines  Your health care provider may recommend certain vaccines, such as: Influenza vaccine. This is recommended every year. Tetanus, diphtheria, and acellular pertussis (Tdap, Td) vaccine. You may need a Td booster every 10 years. Zoster vaccine. You may need this after age 108. Pneumococcal 13-valent conjugate (PCV13) vaccine.  One dose is recommended after age 74. Pneumococcal polysaccharide  (PPSV23) vaccine. One dose is recommended after age 6. Talk to your health care provider about which screenings and vaccines you need and how often you need them. This information is not intended to replace advice given to you by your health care provider. Make sure you discuss any questions you have with your health care provider. Document Released: 09/20/2015 Document Revised: 05/13/2016 Document Reviewed: 06/25/2015 Elsevier Interactive Patient Education  2017 [place] Prevention in the Home Falls can cause injuries. They can happen to people of all ages. There are many things you can do to make your home safe and to help prevent falls. What can I do on the outside of my home? Regularly fix the edges of walkways and driveways and fix any cracks. Remove anything that might make you trip as you walk through a door, such as a raised step or threshold. Trim any bushes or trees on the path to your home. Use bright outdoor lighting. Clear any walking paths of anything that might make someone trip, such as rocks or tools. Regularly check to see if handrails are loose or broken. Make sure that both sides of any steps have handrails. Any raised decks and porches should have guardrails on the edges. Have any leaves, snow, or ice cleared regularly. Use sand or salt on walking paths during winter. Clean up any spills in your garage right away. This includes oil or grease spills. What can I do in the bathroom? Use night lights. Install grab bars by the toilet and in the tub and shower. Do not use towel bars as grab bars. Use non-skid mats or decals in the tub or shower. If you need to sit down in the shower, use a plastic, non-slip stool. Keep the floor dry. Clean up any water that spills on the floor as soon as it happens. Remove soap buildup in the tub or shower regularly. Attach bath mats securely with double-sided non-slip rug tape. Do not have throw rugs and other things on the  floor that can make you trip. What can I do in the bedroom? Use night lights. Make sure that you have a light by your bed that is easy to reach. Do not use any sheets or blankets that are too big for your bed. They should not hang down onto the floor. Have a firm chair that has side arms. You can use this for support while you get dressed. Do not have throw rugs and other things on the floor that can make you trip. What can I do in the kitchen? Clean up any spills right away. Avoid walking on wet floors. Keep items that you use a lot in easy-to-reach places. If you need to reach something above you, use a strong step stool that has a grab bar. Keep electrical cords out of the way. Do not use floor polish or wax that makes floors slippery. If you must use wax, use non-skid floor wax. Do not have throw rugs and other things on the floor that can make you trip. What can I do with my stairs? Do not leave any items on the stairs. Make sure that there are handrails on both sides of the stairs and use them. Fix handrails that are broken or loose. Make sure that handrails are as long as the stairways. Check any carpeting to make sure that it is firmly attached to the stairs. Fix any carpet that is loose or  worn. Avoid having throw rugs at the top or bottom of the stairs. If you do have throw rugs, attach them to the floor with carpet tape. Make sure that you have a light switch at the top of the stairs and the bottom of the stairs. If you do not have them, ask someone to add them for you. What else can I do to help prevent falls? Wear shoes that: Do not have high heels. Have rubber bottoms. Are comfortable and fit you well. Are closed at the toe. Do not wear sandals. If you use a stepladder: Make sure that it is fully opened. Do not climb a closed stepladder. Make sure that both sides of the stepladder are locked into place. Ask someone to hold it for you, if possible. Clearly mark and make  sure that you can see: Any grab bars or handrails. First and last steps. Where the edge of each step is. Use tools that help you move around (mobility aids) if they are needed. These include: Canes. Walkers. Scooters. Crutches. Turn on the lights when you go into a dark area. Replace any light bulbs as soon as they burn out. Set up your furniture so you have a clear path. Avoid moving your furniture around. If any of your floors are uneven, fix them. If there are any pets around you, be aware of where they are. Review your medicines with your doctor. Some medicines can make you feel dizzy. This can increase your chance of falling. Ask your doctor what other things that you can do to help prevent falls. This information is not intended to replace advice given to you by your health care provider. Make sure you discuss any questions you have with your health care provider. Document Released: 06/20/2009 Document Revised: 01/30/2016 Document Reviewed: 09/28/2014 Elsevier Interactive Patient Education  2017 [employer].

## 2021-07-02 NOTE — Progress Notes (Signed)
   [id number]  Name:  Timothy Stone.    MRN: [id number] DOB: December 10, 1940  07/02/2021  Mr. Timothy Stone was observed post Covid-19 immunization for 15 minutes without incident. He was provided with Vaccine Information Sheet and instruction to access the V-Safe system.   Mr. Timothy Stone was instructed to call 911 with any severe reactions post vaccine: Difficulty breathing  Swelling of face and throat  A fast heartbeat  A bad rash all over body  Dizziness and weakness   Immunizations Administered     Name Date Dose VIS Date Route   Pfizer Covid-19 Vaccine Bivalent Booster 07/02/2021  3:00 PM 0.3 mL 05/07/2021 Intramuscular   Manufacturer: [employer], [employer]   Lot: [id number]   NDC: [phone]

## 2021-07-07 DIAGNOSIS — I10 Essential (primary) hypertension: Secondary | ICD-10-CM

## 2021-07-07 DIAGNOSIS — E785 Hyperlipidemia, unspecified: Secondary | ICD-10-CM | POA: Diagnosis not present

## 2021-07-25 ENCOUNTER — Other Ambulatory Visit (HOSPITAL_BASED_OUTPATIENT_CLINIC_OR_DEPARTMENT_OTHER): Payer: Self-pay

## 2021-07-25 MED ORDER — PFIZER COVID-19 VAC BIVALENT 30 MCG/0.3ML IM SUSP
INTRAMUSCULAR | 0 refills | Status: DC
  Filled 2021-07-25: qty 0.3, 1d supply, fill #0

## 2021-09-18 DIAGNOSIS — T7840XS Allergy, unspecified, sequela: Secondary | ICD-10-CM | POA: Diagnosis not present

## 2021-09-18 DIAGNOSIS — H5212 Myopia, left eye: Secondary | ICD-10-CM | POA: Diagnosis not present

## 2021-09-18 DIAGNOSIS — H52223 Regular astigmatism, bilateral: Secondary | ICD-10-CM | POA: Diagnosis not present

## 2021-09-18 DIAGNOSIS — I10 Essential (primary) hypertension: Secondary | ICD-10-CM | POA: Diagnosis not present

## 2021-09-18 DIAGNOSIS — H524 Presbyopia: Secondary | ICD-10-CM | POA: Diagnosis not present

## 2021-09-18 DIAGNOSIS — H81399 Other peripheral vertigo, unspecified ear: Secondary | ICD-10-CM | POA: Diagnosis not present

## 2021-09-18 DIAGNOSIS — H25813 Combined forms of age-related cataract, bilateral: Secondary | ICD-10-CM | POA: Diagnosis not present

## 2021-09-18 LAB — HM DIABETES EYE EXAM

## 2021-09-19 ENCOUNTER — Other Ambulatory Visit: Payer: Self-pay | Admitting: Internal Medicine

## 2021-09-30 ENCOUNTER — Other Ambulatory Visit: Payer: Self-pay | Admitting: Hematology & Oncology

## 2021-09-30 DIAGNOSIS — I824Z1 Acute embolism and thrombosis of unspecified deep veins of right distal lower extremity: Secondary | ICD-10-CM

## 2021-10-03 ENCOUNTER — Other Ambulatory Visit: Payer: Self-pay | Admitting: Internal Medicine

## 2021-10-21 ENCOUNTER — Ambulatory Visit (INDEPENDENT_AMBULATORY_CARE_PROVIDER_SITE_OTHER): Payer: Medicare Other | Admitting: Internal Medicine

## 2021-10-21 ENCOUNTER — Encounter: Payer: Self-pay | Admitting: Internal Medicine

## 2021-10-21 VITALS — BP 128/86 | HR 56 | Temp 98.0°F | Resp 18 | Ht 67.0 in | Wt 202.5 lb

## 2021-10-21 DIAGNOSIS — Z0000 Encounter for general adult medical examination without abnormal findings: Secondary | ICD-10-CM | POA: Diagnosis not present

## 2021-10-21 DIAGNOSIS — I10 Essential (primary) hypertension: Secondary | ICD-10-CM

## 2021-10-21 DIAGNOSIS — E781 Pure hyperglyceridemia: Secondary | ICD-10-CM

## 2021-10-21 DIAGNOSIS — Z1211 Encounter for screening for malignant neoplasm of colon: Secondary | ICD-10-CM

## 2021-10-21 NOTE — Patient Instructions (Addendum)
Check the  blood pressure regularly BP GOAL is between 110/65 and  135/85. If it is consistently higher or lower, let me know  GO TO THE LAB : Get the blood work     GO TO THE FRONT DESK, PLEASE SCHEDULE YOUR APPOINTMENTS Come back for a checkup in 8  month

## 2021-10-21 NOTE — Progress Notes (Signed)
Subjective:    Patient ID: Timothy Harry., male    DOB: 03-11-1941, 81 y.o.   MRN: [id number]  DOS:  10/21/2021 Type of visit - description: cpx  Since the last office visit is doing well. We review his chronic medical problems. Ambulatory BP varies depending mostly on his diet.    Review of Systems   A 14 point review of systems is negative    Past Medical History:  Diagnosis Date   Actinic keratosis    hyperplastic aktinic keratosis forehead s/p surgery Dr Park Liter   BPH (benign prostatic hyperplasia)    s/p TUNA   Chronic cystitis    Dr. Liliane Shi   DVT (deep venous thrombosis) (HCC) 2005   no know triggers per pt   HTN (hypertension)    dx in the 70s   OSA (obstructive sleep apnea)    on CPAP   Recurrent UTI    Umbilical hernia    no surgery   Vitamin D deficiency     Past Surgical History:  Procedure Laterality Date   PROSTATE SURGERY  2003   TUNA around 2003   Social History   Socioeconomic History   Marital status: Married    Spouse name: Not on file   Number of children: 4   Years of education: Not on file   Highest education level: Not on file  Occupational History   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Music therapist, Art gallery manager, 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  Tobacco Use   Smoking status: Never   Smokeless tobacco: Never  Vaping Use   Vaping Use: Never used  Substance and Sexual Activity   Alcohol use: Yes    Alcohol/week: 0.0 standard drinks    Comment: 2x per week   Drug use: No   Sexual activity: Not Currently  Other Topics Concern   Not on file  Social History Narrative   Lives w/ wife   Lost a son 04/2021         Social Determinants of Health   Financial Resource Strain: Low Risk    Difficulty of Paying Living Expenses: Not very hard  Food Insecurity: No Food Insecurity   Worried About Programme researcher, broadcasting/film/video in the Last Year: Never true   Ran Out of Food in the Last Year: Never true  Transportation Needs: No Transportation  Needs   Lack of Transportation (Medical): No   Lack of Transportation (Non-Medical): No  Physical Activity: Sufficiently Active   Days of Exercise per Week: 5 days   Minutes of Exercise per Session: 50 min  Stress: No Stress Concern Present   Feeling of Stress : Not at all  Social Connections: Moderately Isolated   Frequency of Communication with Friends and Family: Once a week   Frequency of Social Gatherings with Friends and Family: Once a week   Attends Religious Services: More than 4 times per year   Active Member of [employer] or Organizations: No   Attends Engineer, structural: Never   Marital Status: Married  Catering manager Violence: Not At Risk   Fear of Current or Ex-Partner: No   Emotionally Abused: No   Physically Abused: No   Sexually Abused: No     Current Outpatient Medications  Medication Instructions   acetaminophen (TYLENOL) 500 mg, Oral, As needed,     bisoprolol-hydrochlorothiazide (ZIAC) 5-6.25 MG tablet 1 tablet, Oral, Daily, Okay to take a second tablet if BP more than 140/85   cyclobenzaprine (FLEXERIL) 10 mg, Oral, At bedtime  PRN   doxazosin (CARDURA) 4 mg, Oral, Daily at bedtime   ELIQUIS 2.5 MG TABS tablet TAKE 1 TABLET BY MOUTH  TWICE DAILY   Flaxseed Oil OIL 1,400 mg, Oral, Daily   fluticasone (FLONASE) 50 MCG/ACT nasal spray USE 2 SPRAYS IN BOTH  NOSTRILS DAILY AS NEEDED  FOR ALLERGIES OR RHINITIS   loratadine (CLARITIN) 10 mg, Oral, Daily   OVER THE COUNTER MEDICATION 1 tablet, Oral, Daily, Prostate Plus Health Complex w/ Cran-Max-Cranberry 1 daily    Vitamin D3 2,000 Units, Oral, Daily       Objective:   Physical Exam BP 128/86 (BP Location: Left Arm, Patient Position: Sitting, Cuff Size: Small)    Pulse (!) 56    Temp 98 F (36.7 C) (Oral)    Resp 18    Ht 5\' 7"  (1.702 m)    Wt 202 lb 8 oz (91.9 kg)    SpO2 98%    BMI 31.72 kg/m  General: Well developed, NAD, BMI noted Neck: No  thyromegaly  HEENT:  Normocephalic . Face symmetric,  atraumatic Lungs:  CTA B Normal respiratory effort, no intercostal retractions, no accessory muscle use. Heart: RRR,  no murmur.  Abdomen:  Not distended, soft, non-tender. No rebound or rigidity.  No bruit. Lower extremities: no pretibial edema bilaterally  Skin: Exposed areas without rash. Not pale. Not jaundice Neurologic:  alert & oriented X3.  Speech normal, gait appropriate for age and unassisted Strength symmetric and appropriate for age.  Psych: Cognition and judgment appear intact.  Cooperative with normal attention span and concentration.  Behavior appropriate. No anxious or depressed appearing.     Assessment    Assessment: Prediabetes HTN Bigeminy: Saw cardiology 2019, Holter: 80% QRS with PVCs.  Echo was done, see report BPH, recurrent UTIs, chronic cystitis OSA, on CPAP Umbilical hernia Actinic keratosis; no derm eval recently as off 02/2018 Tinnitus  H/o vitamin D deficiency H/o R leg  DVT, 2005 and 2020, unprovoked, hematology Rx lifelong anticoagulant   PLAN: Here for CPX Prediabetes: Has a healthy lifestyle, check A1c HTN: Ambulatory.  Range from 100/60 to 140/85.  From time to time BP is low and he delays his BP med intake (see last visit).  We agreed  to continue Ziac, Cardura. Bigeminy: Asymptomatic, anticoagulated RTC 8 months.    This visit occurred during the SARS-CoV-2 public health emergency.  Safety protocols were in place, including screening questions prior to the visit, additional usage of staff PPE, and extensive cleaning of exam room while observing appropriate contact time as indicated for disinfecting solutions.

## 2021-10-22 ENCOUNTER — Encounter: Payer: Self-pay | Admitting: Internal Medicine

## 2021-10-22 LAB — CBC WITH DIFFERENTIAL/PLATELET
Basophils Absolute: 0 10*3/uL (ref 0.0–0.1)
Basophils Relative: 0.7 % (ref 0.0–3.0)
Eosinophils Absolute: 0 10*3/uL (ref 0.0–0.7)
Eosinophils Relative: 0.4 % (ref 0.0–5.0)
HCT: 41.1 % (ref 39.0–52.0)
Hemoglobin: 13.6 g/dL (ref 13.0–17.0)
Lymphocytes Relative: 34.8 % (ref 12.0–46.0)
Lymphs Abs: 2.5 10*3/uL (ref 0.7–4.0)
MCHC: 33.1 g/dL (ref 30.0–36.0)
MCV: 88 fl (ref 78.0–100.0)
Monocytes Absolute: 0.4 10*3/uL (ref 0.1–1.0)
Monocytes Relative: 5.1 % (ref 3.0–12.0)
Neutro Abs: 4.2 10*3/uL (ref 1.4–7.7)
Neutrophils Relative %: 59 % (ref 43.0–77.0)
Platelets: 225 10*3/uL (ref 150.0–400.0)
RBC: 4.67 Mil/uL (ref 4.22–5.81)
RDW: 13.9 % (ref 11.5–15.5)
WBC: 7.1 10*3/uL (ref 4.0–10.5)

## 2021-10-22 NOTE — Assessment & Plan Note (Signed)
--  Td 2015 - pneumonia shot 02/2018;  prevnar 02-2014 -  Zostavax:2011 - shingrex discussed, plans to proceed -COVID VAX UTD -Had a flu shot --CCS:  Cscope @ [facility] 11-08-2001 Dr Loman Chroman: small internal Hemorrhoids. Cscope 05-12-2012, 1 polyp, tubular adenoma, Cscope again 03/30/17, no report. Pro-cons d/w pt, he is unsure of what he likes to do, we agreed on a GI referral to discuss further --DRE PSA: Sees urology --Labs:  CMP, FLP, CBC A1c --lifestyle is very good, exercise daily    -- ACP: Has a living will, recommend to send [place] a copy

## 2021-10-22 NOTE — Assessment & Plan Note (Signed)
Here for CPX Prediabetes: Has a healthy lifestyle, check A1c HTN: Ambulatory.  Range from 100/60 to 140/85.  From time to time BP is low and he delays his BP med intake (see last visit).  We agreed  to continue Ziac, Cardura. Bigeminy: Asymptomatic, anticoagulated RTC 8 months.

## 2021-10-23 LAB — LIPID PANEL
Cholesterol: 166 mg/dL (ref 0–200)
HDL: 31.4 mg/dL — ABNORMAL LOW (ref >39.00)
NonHDL: 134.52
Total CHOL/HDL Ratio: 5
Triglycerides: 205 mg/dL — ABNORMAL HIGH (ref 0.0–149.0)
VLDL: 41 mg/dL — ABNORMAL HIGH (ref 0.0–40.0)

## 2021-10-23 LAB — COMPREHENSIVE METABOLIC PANEL
ALT: 19 U/L (ref 0–53)
AST: 19 U/L (ref 0–37)
Albumin: 4.2 g/dL (ref 3.5–5.2)
Alkaline Phosphatase: 50 U/L (ref 39–117)
BUN: 23 mg/dL (ref 6–23)
CO2: 31 mEq/L (ref 19–32)
Calcium: 9.1 mg/dL (ref 8.4–10.5)
Chloride: 102 mEq/L (ref 96–112)
Creatinine, Ser: 1.11 mg/dL (ref 0.40–1.50)
GFR: 62.33 mL/min (ref >60.00)
Glucose, Bld: 105 mg/dL — ABNORMAL HIGH (ref 70–99)
Potassium: 4.4 mEq/L (ref 3.5–5.1)
Sodium: 138 mEq/L (ref 135–145)
Total Bilirubin: 0.5 mg/dL (ref 0.2–1.2)
Total Protein: 7 g/dL (ref 6.0–8.3)

## 2021-10-23 LAB — LDL CHOLESTEROL, DIRECT: Direct LDL: 106 mg/dL

## 2021-12-29 ENCOUNTER — Ambulatory Visit (INDEPENDENT_AMBULATORY_CARE_PROVIDER_SITE_OTHER): Payer: Medicare Other | Admitting: Pharmacist

## 2021-12-29 DIAGNOSIS — I498 Other specified cardiac arrhythmias: Secondary | ICD-10-CM

## 2021-12-29 DIAGNOSIS — I10 Essential (primary) hypertension: Secondary | ICD-10-CM

## 2021-12-29 DIAGNOSIS — R739 Hyperglycemia, unspecified: Secondary | ICD-10-CM

## 2021-12-29 DIAGNOSIS — E785 Hyperlipidemia, unspecified: Secondary | ICD-10-CM

## 2021-12-29 MED ORDER — ROSUVASTATIN CALCIUM 10 MG PO TABS: 10.0000 mg | ORAL_TABLET | Freq: Every day | ORAL | 3 refills | Status: DC

## 2021-12-29 NOTE — Patient Instructions (Addendum)
Mr. Timothy Stone ?It was a pleasure speaking with you today. I will check back to see how you are tolerating rosuvastatin February 17, 2022 at 1pm ?Below is a summary of your health goals and care plan ? ?Patient Goals/Self-Care Activities ?take medications as prescribed,  ?check blood pressure daily, document, and provide at future appointments, and  ?continue to exercise regularly ?Start rosuvastatin 10mg  daily to lower cholesterol and triglycerides.  ? ?If you have any questions or concerns, please feel free to contact me either at the phone number below or with a MyChart message.  ? ?Keep up the good work! ? ? , PharmD ?Clinical Pharmacist ?[facility] ?[facility] ?[phone] (direct line)  ?[phone] (main office number) ? ?Chronic Care Management Care Plan ? ?Hypertension ?BP Readings from Last 3 Encounters:  ?10/21/21 128/86  ?07/02/21 (!) 150/78  ?04/18/21 (!) 152/80  ? ?Pharmacist Clinical Goal(s): ?Over the next 180 days, patient will work with PharmD and providers to maintain BP goal <140/90 ?Current regimen:  ?Bisoprolol-hydrochlorothiazide 5-6.25mg  once each morning (will take additional 1/2 to 1 tablet depending on BP in afternoon) ?Interventions: ?Discussed blood pressure  goal ?Patient self care activities - Over the next 180 days, patient will: ?Maintain hypertension medication regimen.  ?Continue to check blood pressure twice a day and adjust bisoprolol hydrochlorothiazide according to Dr 06/18/21 recommendations ? ?Hyperlipidemia ?Lab Results  ?Component Value Date/Time  ? LDLCALC 102 (H) 10/14/2020 09:31 AM  ? LDLDIRECT 106.0 10/21/2021 01:31 PM  ? ?Pharmacist Clinical Goal(s): ?Over the next 180 days, patient will work with PharmD and providers to achieve LDL goal < 100 or prevent LDL from increaseing ?Current regimen:  ?Flaxseed Oil 1400mg  daily ?Interventions: ?Discussed LDL goal ?Discuss statin as an option to get LDL <100 ?Patient self care activities - Over the next  180 dfays, patient will: ?Start rosuvastatin 10mg  daily  ?Continue to exercise and limit intake of saturated fat. ? ?Pre-Diabetes ?Lab Results  ?Component Value Date/Time  ? HGBA1C 5.9 10/14/2020 09:31 AM  ? HGBA1C 6.2 11/21/2019 01:29 PM  ? ?Pharmacist Clinical Goal(s): ?Over the next 180 days, patient will work with PharmD and providers to maintain A1c goal <6.5% ?Current regimen:  ?Diet and exercise management   ?Interventions: ?Discussed a1c goal ?Patient self care activities - Over the next 180 days, patient will: ?Maintain a1c <6.5% ? ?BPH:  ?Pharmacist Clinical Goal(s): ?Over the next 180 days, patient will work with PharmD and providers to maintain control of nocturia while minimizing side effects to medication ?Current regimen:  ?Doxazosin 4mg  - take 1 tablet at bedtime ?Interventions: ?Reminded patient to take at night to lower daytime dizziness ?Patient self care activities - Over the next 180 days, patient will: ?Continue current regimen ? ?Health Maintenance:  ?Reviewed vaccination history  ?Consider getting Shingrix vaccination in 2023. Should be covered at no cost by Medicare in 2023.  ? ?Medication management ?Pharmacist Clinical Goal(s): ?Over the next 180 days, patient will work with PharmD and providers to maintain optimal medication adherence ?Current pharmacy: [facility] ?Interventions ?Comprehensive medication review performed. ?Continue current medication management strategy ?Patient self care activities - Over the next 180 days, patient will: ?Focus on medication adherence by filling and taking medications appropriately ?Take medications as prescribed ?Report any questions or concerns to PharmD and/or provider(s) ? ? ? ?Patient verbalizes understanding of instructions and care plan provided today and agrees to view in MyChart. Active MyChart status confirmed with patient.    ?

## 2021-12-29 NOTE — Chronic Care Management (AMB) (Signed)
? ? ?Chronic Care Management ?Pharmacy Note ? ?12/29/2021 ?Name:  Timothy Stone. MRN:  [id number] DOB:  1941-01-03 ? ?Summary:  ?LDL and Triglyceride above goal. Patient has agreed to trial of statin therapy. Started rosuvastatin 14m daily.  Reviewed s/s of adverse reaction to monitor for. Follow up lipids/LFTS in 6 to 8 weeks.  ?Continue to exercise 6 days per week and limit intake of saturated and trans fat.  ? ?Subjective: ?Timothy Stone is an 81y.o. year old male who is a primary patient of Paz, JAlda Berthold MD.  The CCM team was consulted for assistance with disease management and care coordination needs.   ? ?Engaged with patient by telephone for follow up visit in response to provider referral for pharmacy case management and/or care coordination services.  ? ?Consent to Services:  ?The patient was given information about Chronic Care Management services, agreed to services, and gave verbal consent prior to initiation of services.  Please see initial visit note for detailed documentation.  ? ?Patient Care Team: ?PColon Branch MD as PCP - General ?SBelva Crome MD as PCP - Cardiology (Cardiology) ?LVickie Epley MD as PCP - Electrophysiology (Cardiology) ?WCeasar Mons MD as Consulting Physician (Urology) ?RRalene Ok MD as Consulting Physician (General Surgery) ?MMarica Otter OD as Consulting Physician (Optometry) ?MMaggie Schwalbe, MD as Consulting Physician (Otolaryngology) ?ECherre Robins RPH-CPP (Pharmacist) ? ?Recent office visits: ?10/21/2021 -  PCP (Dr PLarose Kells Annual physical exam. Labs checked. Referred to GI. No med changes noted.  ?04/18/2021 - PCP (Dr PLarose Kells Routine check up. Son died 4 days earlier unexpectantly.No medication changes.  ? ?Recent consult visits: ?None in last 6 months ? ?Hospital visits: ?None in previous 6 months ? ?Objective: ? ?Lab Results  ?Component Value Date  ? CREATININE 1.11 10/21/2021  ? CREATININE 1.19 04/18/2021  ? CREATININE 1.21 07/22/2020   ? ? ?Lab Results  ?Component Value Date  ? HGBA1C 5.9 10/14/2020  ? ?Last diabetic Eye exam: No results found for: HMDIABEYEEXA  ?Last diabetic Foot exam: No results found for: HMDIABFOOTEX  ? ?   ?Component Value Date/Time  ? CHOL 166 10/21/2021 1331  ? TRIG 205.0 (H) 10/21/2021 1331  ? HDL 31.40 (L) 10/21/2021 1331  ? CHOLHDL 5 10/21/2021 1331  ? VLDL 41.0 (H) 10/21/2021 1331  ? LOverly102 (H) 10/14/2020 0931  ? LDLDIRECT 106.0 10/21/2021 1331  ? ? ? ?  Latest Ref Rng & Units 10/21/2021  ?  1:31 PM 07/22/2020  ?  8:13 AM 01/19/2020  ?  9:16 AM  ?Hepatic Function  ?Total Protein 6.0 - 8.3 g/dL 7.0   7.1   7.1    ?Albumin 3.5 - 5.2 g/dL 4.2   4.3   4.4    ?AST 0 - 37 U/L '19   16   16    ' ?ALT 0 - 53 U/L '19   21   15    ' ?Alk Phosphatase 39 - 117 U/L 50   51   56    ?Total Bilirubin 0.2 - 1.2 mg/dL 0.5   0.6   0.6    ? ? ?Lab Results  ?Component Value Date/Time  ? TSH 2.114 03/21/2020 03:55 PM  ? TSH 2.09 03/02/2018 09:44 AM  ? TSH 2.32 02/15/2017 08:38 AM  ? ? ? ?  Latest Ref Rng & Units 10/21/2021  ?  1:31 PM 04/18/2021  ?  1:26 PM 07/22/2020  ?  8:13 AM  ?CBC  ?  WBC 4.0 - 10.5 K/uL 7.1   6.6   6.2    ?Hemoglobin 13.0 - 17.0 g/dL 13.6   14.4   14.9    ?Hematocrit 39.0 - 52.0 % 41.1   42.1   44.4    ?Platelets 150.0 - 400.0 K/uL 225.0   205.0   176    ? ? ?Lab Results  ?Component Value Date/Time  ? VD25OH 44.15 10/14/2020 09:31 AM  ? VD25OH 58 05/24/2009 09:48 PM  ? ? ?Clinical ASCVD: No  ?The ASCVD Risk score (Arnett DK, et al., 2019) failed to calculate for the following reasons: ?  The 2019 ASCVD risk score is only valid for ages 82 to 42   ? ? ?Social History  ? ?Tobacco Use  ?Smoking Status Never  ?Smokeless Tobacco Never  ? ?BP Readings from Last 3 Encounters:  ?10/21/21 128/86  ?07/02/21 (!) 150/78  ?04/18/21 (!) 152/80  ? ?Pulse Readings from Last 3 Encounters:  ?10/21/21 (!) 56  ?07/02/21 (!) 57  ?04/18/21 60  ? ?Wt Readings from Last 3 Encounters:  ?10/21/21 202 lb 8 oz (91.9 kg)  ?07/02/21 200 lb 6.4 oz (90.9  kg)  ?04/18/21 198 lb (89.8 kg)  ? ? ?Assessment: Review of patient past medical history, allergies, medications, health status, including review of consultants reports, laboratory and other test data, was performed as part of comprehensive evaluation and provision of chronic care management services.  ? ?SDOH:  (Social Determinants of Health) assessments and interventions performed:  ? ? ? ?CCM Care Plan ? ?Allergies  ?Allergen Reactions  ? Hydrocodone Shortness Of Breath  ? Oxycodone   ? Latex Hives  ? ? ?Medications Reviewed Today   ? ? Reviewed by Colon Branch, MD (Physician) on 10/22/21 at [place] List Status: <None>  ? ?Medication Order Taking? Sig Documenting Provider Last Dose Status Informant  ?acetaminophen (TYLENOL) 500 MG tablet [id number] Yes Take 500 mg by mouth as needed for mild pain or headache. [provider] Taking Active Self  ?         ?Med Note [facility], CARLOS A   Thu Mar 28, 2020  9:17 AM)    ?bisoprolol-hydrochlorothiazide (ZIAC) 5-6.25 MG tablet [id number] Yes TAKE 1 TABLET BY MOUTH  DAILY OKAY TO TAKE A SECOND TABLET IF BP MORE THAN  140/85 Colon Branch, MD Taking Active   ?Cholecalciferol (VITAMIN D3) 2000 units TABS [id number] Yes Take 2,000 Units by mouth daily.  [provider] Taking Active Self  ?cyclobenzaprine (FLEXERIL) 10 MG tablet [id number] No Take 1 tablet (10 mg total) by mouth at bedtime as needed for muscle spasms.  ?Patient not taking: Reported on 12/26/2020  ? Colon Branch, MD Not Taking Active   ?doxazosin (CARDURA) 4 MG tablet [id number] Yes Take 1 tablet (4 mg total) by mouth at bedtime. Colon Branch, MD Taking Active Self  ?ELIQUIS 2.5 MG TABS tablet [id number] Yes TAKE 1 TABLET BY MOUTH  TWICE DAILY Ennever, Rudell Cobb, MD Taking Active   ?Flaxseed Oil OIL [id number] Yes Take 1,400 mg by mouth daily.  [provider] Taking Active Self  ?fluticasone (FLONASE) 50 MCG/ACT nasal spray [id number] Yes USE 2 SPRAYS IN BOTH  NOSTRILS DAILY AS NEEDED  FOR  ALLERGIES OR RHINITIS Colon Branch, MD Taking Active   ?loratadine (CLARITIN) 10 MG tablet [id number] Yes Take 10 mg by mouth daily. [provider] Taking Active   ?OVER THE COUNTER MEDICATION [id number] Yes Take 1 tablet by  mouth daily. Prostate Plus Health Complex w/ Cran-Max-Cranberry 1 daily [provider] Taking Active Self  ? ?  ?  ? ?  ? ? ?Patient Active Problem List  ? Diagnosis Date Noted  ? DVT (deep venous thrombosis) ([place]) 08/21/2019  ? PVC (premature ventricular contraction) 06/29/2018  ? PCP NOTES >>>>>>>>>>>>>>>>>>>> 02/12/2016  ? Umbilical hernia 53/61/4431  ? Hyperglycemia 01/20/2012  ? Annual physical exam 08/10/2011  ? BPH (benign prostatic hyperplasia)   ? Recurrent UTI   ? HYPERTRIGLYCERIDEMIA 11/16/2008  ? Obstructive sleep apnea 03/07/2008  ? Essential hypertension 02/21/2007  ? Allergic rhinitis due to allergen 02/21/2007  ? ? ?Immunization History  ?Administered Date(s) Administered  ? Fluad Quad(high Dose 65+) 05/24/2019, 06/12/2020, 07/02/2021  ? Influenza Split 06/07/2014  ? Influenza Whole 07/28/2007, 05/26/2010  ? Influenza, High Dose Seasonal PF 07/02/2015, 05/27/2016, 06/17/2017, 06/16/2018  ? PFIZER(Purple Top)SARS-COV-2 Vaccination 09/22/2019, 10/13/2019, 07/12/2020, 01/16/2021  ? Pension scheme manager 79yr & up 07/02/2021  ? Pneumococcal Conjugate-13 02/16/2014  ? Pneumococcal Polysaccharide-23 05/18/2008, 03/02/2018  ? Td 09/08/2003  ? Tetanus 02/16/2014  ? Zoster, Live 05/26/2010  ? ? ?Conditions to be addressed/monitored: ?HTN, HLD and BPH; pre Diabetes; recurrent DVT; vitamin D deficiency ? ?There are no care plans that you recently modified to display for this patient. ? ? ? ?Medication Assistance:  Discuss assistance for Eliquis when hits Medicare coverage gap but patient does not qualify based on income.  ? ?Patient's preferred pharmacy is: ? ?[facility] [address] [place] ?[place]?Phone: [phone]: [phone]? ?[id number] [place]Phone: [phone]

## 2022-01-04 DIAGNOSIS — I10 Essential (primary) hypertension: Secondary | ICD-10-CM

## 2022-01-04 DIAGNOSIS — E785 Hyperlipidemia, unspecified: Secondary | ICD-10-CM | POA: Diagnosis not present

## 2022-01-27 ENCOUNTER — Ambulatory Visit: Payer: Medicare Other | Admitting: Cardiology

## 2022-01-27 ENCOUNTER — Encounter: Payer: Self-pay | Admitting: Cardiology

## 2022-01-27 VITALS — BP 142/80 | HR 52 | Ht 67.0 in | Wt 200.8 lb

## 2022-01-27 DIAGNOSIS — I10 Essential (primary) hypertension: Secondary | ICD-10-CM | POA: Diagnosis not present

## 2022-01-27 DIAGNOSIS — I5042 Chronic combined systolic (congestive) and diastolic (congestive) heart failure: Secondary | ICD-10-CM | POA: Diagnosis not present

## 2022-01-27 DIAGNOSIS — I493 Ventricular premature depolarization: Secondary | ICD-10-CM | POA: Diagnosis not present

## 2022-01-27 NOTE — Progress Notes (Signed)
Electrophysiology Office Follow up Visit Note:    Date:  01/27/2022   ID:  Timothy HarryGabriel G Knope Jr., DOB 07-Feb-1941, MRN [id number]  PCP:  Wanda PlumpPaz, Jose E, MD  [facility] Cardiologist:  Lesleigh NoeHenry W Smith III, MD  [facility] Electrophysiologist:  Lanier PrudeAMERON T LAMBERT, MD    Interval History:    Timothy HarryGabriel G Vallery Jr. is a 81 y.o. male who presents for a follow up visit. They were last seen in clinic 11/05/2020 where he was doing well and working on increasing his exercise. He was symptomatic with normal EF so there was no indication for PVC suppression at that time.  Overall, he is feeling well. He states that his PVC's are "sometimes there and sometimes not." In the past year he notes that the PVC's a very infrequent.  In the last 3 months he has noticed some low blood pressures in the systolic 100's in the early morning. He then goes to the gym and his blood pressure is more normal by the time he arrives home.  At least twice a day he checks his blood pressure at home. There have been months at a time that his blood pressure monitor did not show any irregular heartbeats, but it may rarely. He also uses a Kardia device, but this has not detected any irregular heartbeats.  He denies any palpitations, chest pain, shortness of breath, or peripheral edema. No lightheadedness, headaches, syncope, orthopnea, or PND.      Past Medical History:  Diagnosis Date   Actinic keratosis    hyperplastic aktinic keratosis forehead s/p surgery Dr Park LiterAlbertini   BPH (benign prostatic hyperplasia)    s/p TUNA   Chronic cystitis    Dr. Liliane ShiWinter   DVT (deep venous thrombosis) (HCC) 2005   no know triggers per pt   HTN (hypertension)    dx in the 70s   OSA (obstructive sleep apnea)    on CPAP   Recurrent UTI    Umbilical hernia    no surgery   Vitamin D deficiency     Past Surgical History:  Procedure Laterality Date   PROSTATE SURGERY  2003   TUNA around 2003    Current Medications: Current Meds   Medication Sig   acetaminophen (TYLENOL) 500 MG tablet Take 1,000 mg by mouth daily as needed for mild pain or headache.   bisoprolol-hydrochlorothiazide (ZIAC) 5-6.25 MG tablet TAKE 1 TABLET BY MOUTH  DAILY OKAY TO TAKE A SECOND TABLET IF BP MORE THAN  140/85   Cholecalciferol (VITAMIN D3) 2000 units TABS Take 2,000 Units by mouth daily.    doxazosin (CARDURA) 4 MG tablet Take 1 tablet (4 mg total) by mouth at bedtime.   ELIQUIS 2.5 MG TABS tablet TAKE 1 TABLET BY MOUTH  TWICE DAILY   Flaxseed Oil OIL Take 1,400 mg by mouth daily.    fluticasone (FLONASE) 50 MCG/ACT nasal spray USE 2 SPRAYS IN BOTH  NOSTRILS DAILY AS NEEDED  FOR ALLERGIES OR RHINITIS   loratadine (CLARITIN) 10 MG tablet Take 10 mg by mouth daily.   OVER THE COUNTER MEDICATION Take 1 tablet by mouth daily. Prostate Plus Health Complex w/ Cran-Max-Cranberry 1 daily   rosuvastatin (CRESTOR) 10 MG tablet Take 1 tablet (10 mg total) by mouth daily.     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  Tobacco Use   Smoking status: Never   Smokeless tobacco: Never  Vaping Use   Vaping Use: Never used  Substance and Sexual Activity   Alcohol use: Yes    Alcohol/week: 0.0 standard drinks    Comment: 2x per week   Drug use: No   Sexual activity: Not Currently  Other Topics Concern   Not on file  Social History Narrative   Lives w/ wife   Lost a son 04/2021         Social Determinants of Health   Financial Resource Strain: Low Risk    Difficulty of Paying Living Expenses: Not very hard  Food Insecurity: No Food Insecurity   Worried About Programme researcher, broadcasting/film/video in the Last Year: Never true   Ran Out of Food  in the Last Year: Never true  Transportation Needs: No Transportation Needs   Lack of Transportation (Medical): No   Lack of Transportation (Non-Medical): No  Physical Activity: Sufficiently Active   Days of Exercise per Week: 6 days   Minutes of Exercise per Session: 50 min  Stress: No Stress Concern Present   Feeling of Stress : Not at all  Social Connections: Moderately Isolated   Frequency of Communication with Friends and Family: Once a week   Frequency of Social Gatherings with Friends and Family: Once a week   Attends Religious Services: More than 4 times per year   Active Member of [employer] or Organizations: No   Attends Engineer, structural: Never   Marital Status: Married     Family History: The patient's family history includes Breast cancer in his sister; Diabetes in his mother; Heart attack (age of onset: 81) in his father; Lung cancer (age of onset: 35) in his brother; Rectal cancer (age of onset: 77) in his sister. There is no history of Prostate cancer.  ROS:   Please see the history of present illness.    (+) Very rare palpitations All other systems reviewed and are negative.  EKGs/Labs/Other Studies Reviewed:    The following studies were reviewed today:  04/2020  Monitor Sinus rhythm and sinus bradycardia was the underlying rhythm. Heart rate ranged between 47 and 142 bpm. Frequent premature ventricular ectopic beats occurred, with a burden of 35%. Rare ventricular bigeminy and trigeminy was noted. A 4 beat run of ventricular tachycardia occurred. PACs were rare. First-degree AV block was noted.   Frequent premature ventricular contractions with burden approximately 35%.  Could be at risk for cardiomyopathy.  04/11/2020  Echo  1. Left ventricular ejection fraction, by estimation, is 40 to 45% with  beat to beat variability due to ectopy. The left ventricle has mildly  decreased function. The left ventricle demonstrates global hypokinesis.  Left  ventricular diastolic parameters  are consistent with Grade II diastolic dysfunction (pseudonormalization).  Elevated left ventricular end-diastolic pressure.   2. Right ventricular systolic function is normal. The right ventricular  size is mildly enlarged. Mildly increased right ventricular wall  thickness. There is normal pulmonary artery systolic pressure.   3. The mitral valve is grossly normal. Mild mitral valve regurgitation.  No evidence of mitral stenosis.   4. The aortic valve is tricuspid. Aortic valve regurgitation is not  visualized. No aortic stenosis is present.   5. Aortic dilatation noted. 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   6. The inferior vena cava is normal in size with greater than 50%  respiratory variability, suggesting right atrial pressure of 3 mmHg.   7. Left atrial size was mildly dilated.   Comparison(s): A prior study was performed on 03/07/2018. Prior images  reviewed side by side. LVEF has decreased.     EKG:  EKG is personally reviewed.  01/27/2022: Sinus bradycardia with a ventricular rate of 52 bpm.  No PVCs. 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)  Recent Labs: 10/21/2021: ALT 19; BUN 23; Creatinine, Ser 1.11; Hemoglobin 13.6; Platelets 225.0; Potassium 4.4; Sodium 138   Recent Lipid Panel    Component Value Date/Time   CHOL 166 10/21/2021 1331   TRIG 205.0 (H) 10/21/2021 1331   HDL 31.40 (L) 10/21/2021 1331   CHOLHDL 5 10/21/2021 1331   VLDL 41.0 (H) 10/21/2021 1331   LDLCALC 102 (H) 10/14/2020 0931   LDLDIRECT 106.0 10/21/2021 1331    Physical Exam:    VS:  BP (!) 142/80   Pulse (!) 52   Ht 5\' 7"  (1.702 m)   Wt 200 lb 12.8 oz (91.1 kg)   SpO2 98%   BMI 31.45 kg/m     Wt Readings from Last 3 Encounters:  01/27/22 200 lb 12.8 oz (91.1 kg)  10/21/21 202 lb 8 oz (91.9 kg)  07/02/21 200 lb 6.4 oz (90.9 kg)     GEN: Well nourished, well developed in no acute  distress HEENT: Normal NECK: No JVD; No carotid bruits LYMPHATICS: No lymphadenopathy CARDIAC: RRR, no murmurs, rubs, gallops RESPIRATORY:  Clear to auscultation without rales, wheezing or rhonchi  ABDOMEN: Soft, non-tender, non-distended MUSCULOSKELETAL:  No edema; No deformity  SKIN: Warm and dry NEUROLOGIC:  Alert and oriented x 3 PSYCHIATRIC:  Normal affect        ASSESSMENT:    1. PVC (premature ventricular contraction)   2. Essential hypertension   3. Chronic combined systolic and diastolic heart failure (HCC)    PLAN:    In order of problems listed above:  #PVCs Largely resolved since I last saw him.  Continue bisoprolol.  #Hypertension Controlled.  Continue monitoring blood pressures 1-2 times per week.  He should bring these recordings to your primary care physician for further medication titration.  #Chronic mild systolic and diastolic heart failure Last echo showed a mildly reduced ejection fraction of 45% in 2021.  NYHA class I today.    Follow-up in 2 years or sooner as needed.   Medication Adjustments/Labs and Tests Ordered: Current medicines are reviewed at length with the patient today.  Concerns regarding medicines are outlined above.  Orders Placed This Encounter  Procedures   EKG 12-Lead   No orders of the defined types were placed in this encounter.   I,Mathew Stumpf,acting as a 2022 for Neurosurgeon, MD.,have documented all relevant documentation on the behalf of Lanier Prude, MD,as directed by  Lanier Prude, MD while in the presence of Lanier Prude, MD.  I, Lanier Prude, MD, have reviewed all documentation for this visit. The documentation on 01/27/22 for the exam, diagnosis, procedures, and orders are all accurate and complete.   Signed, 01/29/22, MD, [facility], [facility] 01/27/2022 1:09 PM    Electrophysiology [facility]

## 2022-01-27 NOTE — Patient Instructions (Signed)
Medication Instructions:  Your physician recommends that you continue on your current medications as directed. Please refer to the Current Medication list given to you today. *If you need a refill on your cardiac medications before your next appointment, please call your pharmacy*  Lab Work: None. If you have labs (blood work) drawn today and your tests are completely normal, you will receive your results only by: MyChart Message (if you have MyChart) OR A paper copy in the mail If you have any lab test that is abnormal or we need to change your treatment, we will call you to review the results.  Testing/Procedures: None.  Follow-Up: At [facility], you and your health needs are our priority.  As part of our continuing mission to provide you with exceptional heart care, we have created designated Provider Care Teams.  These Care Teams include your primary Cardiologist (physician) and Advanced Practice Providers (APPs -  Physician Assistants and Nurse Practitioners) who all work together to provide you with the care you need, when you need it.  Your physician wants you to follow-up in: 2 years with Steffanie Dunn, MD or one of the following Advanced Practice Providers on your designated Care Team:    Francis Dowse, [place] Casimiro Needle "Mardelle Matte" [place], [place]   You will receive a reminder letter in the mail two months in advance. If you don't receive a letter, please call our office to schedule the follow-up appointment.  We recommend signing up for the patient portal called "MyChart".  Sign up information is provided on this After Visit Summary.  MyChart is used to connect with patients for Virtual Visits (Telemedicine).  Patients are able to view lab/test results, encounter notes, upcoming appointments, etc.  Non-urgent messages can be sent to your provider as well.   To learn more about what you can do with MyChart, go to [email or web address].    Any Other Special Instructions Will Be Listed  Below (If Applicable).

## 2022-02-17 ENCOUNTER — Ambulatory Visit (INDEPENDENT_AMBULATORY_CARE_PROVIDER_SITE_OTHER): Payer: Medicare Other | Admitting: Pharmacist

## 2022-02-17 DIAGNOSIS — E785 Hyperlipidemia, unspecified: Secondary | ICD-10-CM

## 2022-02-17 DIAGNOSIS — I10 Essential (primary) hypertension: Secondary | ICD-10-CM

## 2022-02-17 NOTE — Patient Instructions (Signed)
Timothy Stone It was a pleasure speaking with you  Below is a summary of your health goals and care plan  If you have any questions or concerns, please feel free to contact me either at the phone number below or with a MyChart message.   Keep up the good work!  Henrene Pastor, PharmD Clinical Pharmacist [facility] [phone] (direct line)  [phone] (main office number)   Chronic Care Management Care Plan  Hypertension BP Readings from Last 3 Encounters:  01/27/22 (!) 142/80  10/21/21 128/86  07/02/21 (!) 150/78   Pharmacist Clinical Goal(s): Over the next 180 days, patient will work with PharmD and providers to maintain BP goal <140/90 Current regimen:  Bisoprolol-hydrochlorothiazide 5-6.25mg  once each morning (will take additional 1/2 to 1 tablet depending on BP in afternoon) Interventions: Discussed blood pressure  goal Patient self care activities - Over the next 180 days, patient will: Maintain hypertension medication regimen.  Continue to check blood pressure twice a day and adjust bisoprolol hydrochlorothiazide according to Dr Leta Jungling recommendations  Hyperlipidemia Lab Results  Component Value Date/Time   LDLCALC 102 (H) 10/14/2020 09:31 AM   LDLDIRECT 106.0 10/21/2021 01:31 PM   Pharmacist Clinical Goal(s): Over the next 180 days, patient will work with PharmD and providers to achieve LDL goal < 100 or prevent LDL from increaseing Current regimen:  Flaxseed Oil 1400mg  daily Rosuvastatin 10mg  daily (started 12/29/2021) Interventions: Discussed LDL goal Discuss statin as an option to get LDL <100 Patient self care activities - Over the next 180 dfays, patient will: Continue rosuvastatin 10mg  daily and Flaxseed Oil Continue to exercise and limit intake of saturated fat.  Pre-Diabetes Lab Results  Component Value Date/Time   HGBA1C 5.9 10/14/2020 09:31 AM   HGBA1C 6.2 11/21/2019 01:29 PM   Pharmacist Clinical Goal(s): Over  the next 180 days, patient will work with PharmD and providers to maintain A1c goal <6.5% Current regimen:  Diet and exercise management   Interventions: Discussed a1c goal Patient self care activities - Over the next 180 days, patient will: Maintain a1c <6.5%  BPH:  Pharmacist Clinical Goal(s): Over the next 180 days, patient will work with PharmD and providers to maintain control of nocturia while minimizing side effects to medication Current regimen:  Doxazosin 4mg  - take 1 tablet at bedtime Interventions: Reminded patient to take at night to lower daytime dizziness Patient self care activities - Over the next 180 days, patient will: Continue current regimen   Medication management Pharmacist Clinical Goal(s): Over the next 180 days, patient will work with PharmD and providers to maintain optimal medication adherence Current pharmacy: [facility] Interventions Comprehensive medication review performed. Continue current medication management strategy Patient self care activities - Over the next 180 days, patient will: Focus on medication adherence by filling and taking medications appropriately Take medications as prescribed Report any questions or concerns to PharmD and/or provider(s)  Patient Goals/Self-Care Activities take medications as prescribed,  check blood pressure daily, document, and provide at future appointments, and  continue to exercise regularly Continue rosuvastatin 10mg  daily to lower cholesterol and triglycerides.    Patient verbalizes understanding of instructions and care plan provided today and agrees to view in MyChart. Active MyChart status and patient understanding of how to access instructions and care plan via MyChart confirmed with patient.

## 2022-02-17 NOTE — Chronic Care Management (AMB) (Signed)
Chronic Care Management Pharmacy Note  02/17/2022 Name:  Timothy Stone. MRN:  [id number] DOB:  05-21-1941  Summary:  Last  and Triglyceride above goal. Patient started rosuvastatin 51m daily 12/29/2021 and is tolerating well. Planned to reheck lipids/LFTS in 6 to 8 weeks but patient would like to wait until appointment with PCP in October 2023.  Continue to exercise 6 days per week and limit intake of saturated and trans fat.   Subjective: Timothy Stone is an 81y.o. year old male who is a primary patient of Paz, JAlda Berthold MD.  The CCM team was consulted for assistance with disease management and care coordination needs.    Engaged with patient by telephone for follow up visit in response to provider referral for pharmacy case management and/or care coordination services.   Consent to Services:  The patient was given information about Chronic Care Management services, agreed to services, and gave verbal consent prior to initiation of services.  Please see initial visit note for detailed documentation.   Patient Care Team: PColon Branch MD as PCP - General STamala JulianHLynnell Dike MD as PCP - Cardiology (Cardiology) LVickie Epley MD as PCP - Electrophysiology (Cardiology) WCeasar Mons MD as Consulting Physician (Urology) RRalene Ok MD as Consulting Physician (General Surgery) MMarica Otter [place]ock [place]as Consulting Physician (Optometry) MLaurance FlattenDVelora Heckler, MD as Consulting Physician (Otolaryngology) ECherre Robins RPH-CPP (Pharmacist)  Recent office visits: 10/21/2021 -  PCP (Dr PLarose Kells Annual physical exam. Labs checked. Referred to GI. No med changes noted.  04/18/2021 - PCP (Dr PLarose Kells Routine check up. Son died 4 days earlier unexpectantly.No medication changes.   Recent consult visits: 01/27/2022 - Cardio (Dr LQuentin Ore Seen for PVC and CHF. No med changes noted.  Hospital visits: None in previous 6 months  Objective:  Lab Results  Component Value Date    CREATININE 1.11 10/21/2021   CREATININE 1.19 04/18/2021   CREATININE 1.21 07/22/2020    Lab Results  Component Value Date   HGBA1C 5.9 10/14/2020   Last diabetic Eye exam: No results found for: "HMDIABEYEEXA"  Last diabetic Foot exam: No results found for: "HMDIABFOOTEX"      Component Value Date/Time   CHOL 166 10/21/2021 1331   TRIG 205.0 (H) 10/21/2021 1331   HDL 31.40 (L) 10/21/2021 1331   CHOLHDL 5 10/21/2021 1331   VLDL 41.0 (H) 10/21/2021 1331   LDLCALC 102 (H) 10/14/2020 0931   LDLDIRECT 106.0 10/21/2021 1331       Latest Ref Rng & Units 10/21/2021    1:31 PM 07/22/2020    8:13 AM 01/19/2020    9:16 AM  Hepatic Function  Total Protein 6.0 - 8.3 g/dL 7.0  7.1  7.1   Albumin 3.5 - 5.2 g/dL 4.2  4.3  4.4   AST 0 - 37 U/L '19  16  16   ' ALT 0 - 53 U/L '19  21  15   ' Alk Phosphatase 39 - 117 U/L 50  51  56   Total Bilirubin 0.2 - 1.2 mg/dL 0.5  0.6  0.6     Lab Results  Component Value Date/Time   TSH 2.114 03/21/2020 03:55 PM   TSH 2.09 03/02/2018 09:44 AM   TSH 2.32 02/15/2017 08:38 AM       Latest Ref Rng & Units 10/21/2021    1:31 PM 04/18/2021    1:26 PM 07/22/2020    8:13 AM  CBC  WBC 4.0 - 10.5 K/uL  7.1  6.6  6.2   Hemoglobin 13.0 - 17.0 g/dL 13.6  14.4  14.9   Hematocrit 39.0 - 52.0 % 41.1  42.1  44.4   Platelets 150.0 - 400.0 K/uL 225.0  205.0  176     Lab Results  Component Value Date/Time   VD25OH 44.15 10/14/2020 09:31 AM   VD25OH 58 05/24/2009 09:48 PM    Clinical ASCVD: No  The ASCVD Risk score (Arnett DK, et al., 2019) failed to calculate for the following reasons:   The 2019 ASCVD risk score is only valid for ages 82 to 3     Social History   Tobacco Use  Smoking Status Never  Smokeless Tobacco Never   BP Readings from Last 3 Encounters:  01/27/22 (!) 142/80  10/21/21 128/86  07/02/21 (!) 150/78   Pulse Readings from Last 3 Encounters:  01/27/22 (!) 52  10/21/21 (!) 56  07/02/21 (!) 57   Wt Readings from Last 3 Encounters:   01/27/22 200 lb 12.8 oz (91.1 kg)  10/21/21 202 lb 8 oz (91.9 kg)  07/02/21 200 lb 6.4 oz (90.9 kg)    Assessment: Review of patient past medical history, allergies, medications, health status, including review of consultants reports, laboratory and other test data, was performed as part of comprehensive evaluation and provision of chronic care management services.   SDOH:  (Social Determinants of Health) assessments and interventions performed:  SDOH Interventions    Flowsheet Row Most Recent Value  SDOH Interventions   Financial Strain Interventions Intervention Not Indicated  Transportation Interventions Intervention Not Indicated        CCM Care Plan  Allergies  Allergen Reactions   Hydrocodone Shortness Of Breath   Oxycodone    Latex Hives    Medications Reviewed Today     Reviewed by Cherre Robins, RPH-CPP (Pharmacist) on 02/17/22 at 1313  Med List Status: <None>   Medication Order Taking? Sig Documenting Provider Last Dose Status Informant  acetaminophen (TYLENOL) 500 MG tablet [id number] Yes Take 1,000 mg by mouth daily as needed for mild pain or headache. [provider] Taking Active Self           Med Note [facility] Algis Downs, Marca Ancona Mar 28, 2020  9:17 AM)    bisoprolol-hydrochlorothiazide [facility]) 5-6.25 MG tablet [id number] Yes TAKE 1 TABLET BY MOUTH  DAILY OKAY TO TAKE A SECOND TABLET IF BP MORE THAN  140/85 Colon Branch, MD Taking Active   Cholecalciferol (VITAMIN D3) 2000 units TABS [id number] Yes Take 2,000 Units by mouth daily.  [provider] Taking Active Self  doxazosin (CARDURA) 4 MG tablet [id number] Yes Take 1 tablet (4 mg total) by mouth at bedtime. Colon Branch, MD Taking Active Self  ELIQUIS 2.5 MG TABS tablet [id number] Yes TAKE 1 TABLET BY MOUTH  TWICE DAILY Volanda Napoleon, MD Taking Active   Flaxseed Oil OIL [id number] Yes Take 1,400 mg by mouth daily.  [provider] Taking Active Self  fluticasone (FLONASE) 50 MCG/ACT  nasal spray [id number] Yes USE 2 SPRAYS IN BOTH  NOSTRILS DAILY AS NEEDED  FOR ALLERGIES OR RHINITIS Colon Branch, MD Taking Active   loratadine (CLARITIN) 10 MG tablet [id number] Yes Take 10 mg by mouth daily. [provider] Taking Active   OVER THE [place] [id number] Yes Take 1 tablet by mouth daily. Prostate Plus Health Complex w/ Cran-Max-Cranberry 1 daily [provider] Taking Active Self  rosuvastatin (CRESTOR) 10 MG tablet [id number]  Yes Take 1 tablet (10 mg total) by mouth daily. 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   Obstructive sleep apnea 03/07/2008   Essential hypertension 02/21/2007   Allergic rhinitis due to allergen 02/21/2007    Immunization History  Administered Date(s) Administered   Fluad Quad(high Dose 65+) 05/24/2019, 06/12/2020, 07/02/2021   Influenza Split 06/07/2014   Influenza Whole 07/28/2007, 05/26/2010   Influenza, High Dose Seasonal PF 07/02/2015, 05/27/2016, 06/17/2017, 06/16/2018   PFIZER(Purple Top)SARS-COV-2 Vaccination 09/22/2019, 10/13/2019, 07/12/2020, 01/16/2021   Pfizer Covid-19 Vaccine Bivalent Booster 10yr & up 07/02/2021   Pneumococcal Conjugate-13 02/16/2014   Pneumococcal Polysaccharide-23 05/18/2008, 03/02/2018   Td 09/08/2003   Tetanus 02/16/2014   Zoster, Live 05/26/2010    Conditions to be addressed/monitored: HTN, HLD and BPH; pre Diabetes; recurrent DVT; vitamin D deficiency  Care Plan : General Pharmacy (Adult)  Updates made by ECherre Robins RPH-CPP since 02/17/2022 12:00 AM     Problem: Provide education, support and  care coordination for medication therapy and chronic conditions   Priority: High  Onset Date: 12/26/2020  Note:   Current Barriers:  Chronic Disease Management support, education, and care coordination needs related to Hypertension, Hyperlipidemia, Pre-Diabetes, Recurrent DVT, BPH  Pharmacist Clinical Goal(s):  Over the next 180 days, patient will achieve adherence to monitoring guidelines and medication adherence to achieve therapeutic efficacy through collaboration with PharmD and provider.   Interventions: 1:1 collaboration with PColon Branch MD regarding development and update of comprehensive plan of care as evidenced by provider attestation and co-signature Inter-disciplinary care team collaboration (see longitudinal plan of care) Comprehensive medication review performed; medication list updated in electronic medical record  Pre - Diabetes: Controlled; goal A1c < 6.5% Lab Results  Component Value Date   HGBA1C 5.9 10/14/2020  current treatment: none Current glucose readings: doesn't check BG at home Current exercise: daily - usually 50 minutes at gym - 30 minutes on treadmill and 20 minutes of stretching No interventions - continue diet and exercise   Hypertension (goal <140/85): Blood pressure Improving BP Readings from Last 3 Encounters:  10/21/21 128/86  07/02/21 (!) 150/78  04/18/21 (!) 152/80   Current regimen:  Bisoprolol-hydrochlorothiazide  5-6.25mg once each morning (will take additional 1/2 to 1 tablet depending on BP in afternoon) Patient reports that daytime dizziness has improved with taking doxazosin at night.  Checks BP at home twice a day. Usually is 125-130 / 60 to 70's in morning. If in afternoon BP is >140/85 he will take additional bisoprolol/HCTZ. Takes additional tablet about 5% of time  Patient goes to gym 6 days per week for 60 minutes - walks on treadmill or uses stationary bike and stretches.  Interventions: Discussed blood pressure goal Maintain  hypertension medication regimen.  Continue to check blood pressure 1 to 2 times daily Continue to exercise with goal of at least 150 minutes per week. Do variety of exercise - cardio, stretching and light weights.  Hyperlipidemia - goals: LDL <100, HDLC > 40 and triglycerides > 150 :  Not at goal  Lab Results  Component Value Date  CHOL 166 10/21/2021   HDL 31.40 (L) 10/21/2021   LDLCALC 102 (H) 10/14/2020   LDLDIRECT 106.0 10/21/2021   TRIG 205.0 (H) 10/21/2021   CHOLHDL 5 10/21/2021   Current regimen:  Flaxseed Oil 1422m daily Rosuvastatin 179mdaily Interventions: Discussed lipid goals Continue current therapy - due to recheck labs in October 2023.  Continue to limit intake of saturated fat diet and exercise daily  BPH:  Current regimen:  Doxazosin 65m57m take 1 tablet at bedtime Interventions: Reminded patient to take at night to lower daytime dizziness Continue current regimen    Medication management Current pharmacy: [facility] Interventions Comprehensive medication review performed. Updated med list Reviewed refill history Discussed patient assistance for Eliquis when reaches coverage gap. Unfortunately patient's yearly income does not qualify for assistance.   Patient Goals/Self-Care Activities Over the next 180 days, patient will:  take medications as prescribed,  check blood pressure daily, document, and provide at future appointments, and  continue to exercise regularly Continue rosuvastatin 40m15mily to lower cholesterol and triglycerides.   Follow Up Plan: Telephone follow up appointment with care management team member scheduled for:  6 months              Medication Assistance:  Discuss assistance for Eliquis when hits Medicare coverage gap but patient does not qualify based on income.   Patient's preferred pharmacy is:  [facility]  [address]: [phone]: [phone]/pharmacy #[id number] [place]- [place] [place]: [phone] endorses 96% compliance  Follow Up:  Patient requests no follow-up at this time.  Plan: Telephone follow up appointment with care management team member scheduled for:   4 to 6 months  TammyCherre RobinsrmD Clinical Pharmacist [facility]

## 2022-03-06 DIAGNOSIS — E785 Hyperlipidemia, unspecified: Secondary | ICD-10-CM | POA: Diagnosis not present

## 2022-03-06 DIAGNOSIS — N400 Enlarged prostate without lower urinary tract symptoms: Secondary | ICD-10-CM | POA: Diagnosis not present

## 2022-03-06 DIAGNOSIS — I10 Essential (primary) hypertension: Secondary | ICD-10-CM | POA: Diagnosis not present

## 2022-03-23 DIAGNOSIS — R3914 Feeling of incomplete bladder emptying: Secondary | ICD-10-CM | POA: Diagnosis not present

## 2022-03-23 DIAGNOSIS — R8279 Other abnormal findings on microbiological examination of urine: Secondary | ICD-10-CM | POA: Diagnosis not present

## 2022-03-23 DIAGNOSIS — N3020 Other chronic cystitis without hematuria: Secondary | ICD-10-CM | POA: Diagnosis not present

## 2022-03-30 DIAGNOSIS — N3020 Other chronic cystitis without hematuria: Secondary | ICD-10-CM | POA: Diagnosis not present

## 2022-05-01 ENCOUNTER — Telehealth: Payer: Self-pay | Admitting: *Deleted

## 2022-05-01 NOTE — Telephone Encounter (Signed)
   Pre-operative Risk Assessment    Patient Name: Timothy Stone.  DOB: 09/05/1941 MRN: [id number]      Request for Surgical Clearance    Procedure:   COLONOSCOPY  Date of Surgery:  Clearance TBD                                 Surgeon:  DR. Loman Chroman Surgeon's Group or Practice Name:  [facility] Phone number:  [phone] Fax number:  [phone]   Type of Clearance Requested:   - Medical  - Pharmacy:  Hold Apixaban (Eliquis) x 2 DAYS PRIOR   Type of Anesthesia:  Not Indicated (PROPOFOL?)   Additional requests/questions:    Elpidio Anis   05/01/2022, 4:38 PM

## 2022-05-04 NOTE — Telephone Encounter (Addendum)
   Name: Timothy Stone.  DOB: 1940/09/13  MRN: [id number]  Primary Cardiologist: Lesleigh Noe, MD   Preoperative team, please contact this patient and set up a phone call appointment for further preoperative risk assessment. Please obtain consent and complete medication review. Thank you for your help.  Please note, the patient's anticoagulation is not managed by our office. Please notify requesting surgeon that rec'd regarding Eliquis will need to come from his hematologist.   Tereso Newcomer, PA-C 05/04/2022, 5:29 PM [facility]

## 2022-05-05 NOTE — Telephone Encounter (Signed)
Left message to call back for tele pre op appt 

## 2022-05-06 NOTE — Telephone Encounter (Signed)
2nd attempt to reach pt regarding surgical clearance and the need of a tele visit.  Left pt a message to call back.

## 2022-05-08 NOTE — Telephone Encounter (Signed)
Our office has tried x 3 to reach the pt to set up tele pre op appt. Pt has not returned our calls. I will update the requesting office. I will send a letter to the pt to call for a tele pre op appt. Will remove from the pre op call back pool at this time.

## 2022-05-22 ENCOUNTER — Ambulatory Visit (INDEPENDENT_AMBULATORY_CARE_PROVIDER_SITE_OTHER): Payer: Medicare Other | Admitting: Family

## 2022-05-22 VITALS — BP 128/78 | HR 72 | Temp 98.8°F | Resp 16 | Ht 67.0 in | Wt 193.6 lb

## 2022-05-22 DIAGNOSIS — U071 COVID-19: Secondary | ICD-10-CM

## 2022-05-22 MED ORDER — MOLNUPIRAVIR EUA 200MG CAPSULE: 4.0000 | ORAL_CAPSULE | Freq: Two times a day (BID) | ORAL | 0 refills | Status: AC

## 2022-05-22 MED ORDER — AZELASTINE HCL 0.1 % NA SOLN: 2.0000 | Freq: Two times a day (BID) | NASAL | 12 refills | Status: DC

## 2022-05-22 MED ORDER — BENZONATATE 100 MG PO CAPS: 100.0000 mg | ORAL_CAPSULE | Freq: Three times a day (TID) | ORAL | 0 refills | Status: DC | PRN

## 2022-05-22 NOTE — Progress Notes (Signed)
Timothy Stone. is a 81 y.o. 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     azelastine (ASTELIN) 0.1 % nasal spray Place 2 sprays into both nostrils 2 (two) times daily. Use in each nostril as directed 30 mL 12   benzonatate (TESSALON) 100 MG capsule Take 1 capsule (100 mg total) by mouth 3 (three) times daily as needed. 20 capsule 0   bisoprolol-hydrochlorothiazide (ZIAC) 5-6.25 MG tablet TAKE 1 TABLET BY MOUTH  DAILY OKAY TO TAKE A SECOND TABLET IF BP MORE THAN  140/85 180 tablet 1   Cholecalciferol (VITAMIN D3) 2000 units TABS Take 2,000 Units by mouth daily.      doxazosin (CARDURA) 4 MG tablet Take 1 tablet (4 mg total) by mouth at bedtime. 90 tablet 1   ELIQUIS 2.5 MG TABS tablet TAKE 1 TABLET BY MOUTH  TWICE DAILY 180 tablet 3   Flaxseed Oil OIL Take 1,400 mg by mouth daily.      fluticasone (FLONASE) 50 MCG/ACT nasal spray USE 2 SPRAYS IN BOTH  NOSTRILS DAILY AS NEEDED  FOR ALLERGIES OR RHINITIS 48 g 3   loratadine (CLARITIN) 10 MG tablet Take 10 mg by mouth daily.     molnupiravir EUA (LAGEVRIO) 200 mg CAPS capsule Take 4 capsules (800 mg total) by mouth 2 (two) times daily for 5 days. 40 capsule 0   OVER THE COUNTER MEDICATION Take 1 tablet by mouth daily. Prostate Plus Health Complex w/  Cran-Max-Cranberry 1 daily     rosuvastatin (CRESTOR) 10 MG tablet Take 1 tablet (10 mg total) by mouth daily. 90 tablet 3   No current facility-administered medications for this visit.    Allergies: Hydrocodone, Oxycodone, and Latex  Past Medical History:  Diagnosis Date   Actinic keratosis    hyperplastic aktinic keratosis forehead s/p surgery Dr Park Liter   BPH (benign prostatic hyperplasia)    s/p TUNA   Chronic cystitis    Dr. Liliane Shi   DVT (deep venous thrombosis) (HCC) 2005   no know triggers per pt   HTN (hypertension)    dx in the 70s   OSA (obstructive sleep apnea)    on CPAP   Recurrent UTI    Umbilical hernia    no surgery   Vitamin D deficiency     Past Surgical History:  Procedure Laterality Date   PROSTATE SURGERY  2003   TUNA around 2003    Family History  Problem Relation Age of Onset   Diabetes Mother        M, sister   Heart attack Father 44   Breast cancer Sister    Rectal cancer Sister 53       @ time of her first Cscope   Lung cancer Brother 97       smoker    Prostate cancer Neg  Hx     Social History   Tobacco Use   Smoking status: Never   Smokeless tobacco: Never  Substance Use Topics   Alcohol use: Yes    Alcohol/week: 0.0 standard drinks of alcohol    Comment: 2x per week    Subjective:  4 day history of cough/ congestion; tested positive for COVID this am; no fever or chest pain or shortness of breath; did take Claritin this am which was beneficial;      Objective:  Vitals:   05/22/22 1121  BP: 128/78  Pulse: 72  Resp: 16  Temp: 98.8 F (37.1 C)  TempSrc: Oral  SpO2: 97%  Weight: 193 lb 9.6 oz (87.8 kg)  Height: 5\' 7"  (1.702 m)    General: Well developed, well nourished, in no acute distress  Skin : Warm and dry.  Head: Normocephalic and atraumatic  Eyes: Sclera and conjunctiva clear; pupils round and reactive to light; extraocular movements intact  Ears: External normal; canals clear; tympanic membranes normal   Oropharynx: Pink, supple. No suspicious lesions  Neck: Supple without thyromegaly, adenopathy  Lungs: Respirations unlabored; clear to auscultation bilaterally without wheeze, rales, rhonchi  CVS exam: normal rate and regular rhythm.  Neurologic: Alert and oriented; speech intact; face symmetrical; moves all extremities well; CNII-XII intact without focal deficit   Assessment:  1. COVID-19     Plan:  Symptomatic treatment encouraged; Rx for Molnupiravir, Tessalon Perles and Astelin; increase fluids, rest and follow up worse, no better; to consider oral antibiotic if symptoms persist.   No follow-ups on file.  No orders of the defined types were placed in this encounter.   Requested Prescriptions   Signed Prescriptions Disp Refills   molnupiravir EUA (LAGEVRIO) 200 mg CAPS capsule 40 capsule 0    Sig: Take 4 capsules (800 mg total) by mouth 2 (two) times daily for 5 days.   benzonatate (TESSALON) 100 MG capsule 20 capsule 0    Sig: Take 1 capsule (100 mg total) by mouth 3 (three) times daily as needed.   azelastine (ASTELIN) 0.1 % nasal spray 30 mL 12    Sig: Place 2 sprays into both nostrils 2 (two) times daily. Use in each nostril as directed

## 2022-05-23 ENCOUNTER — Other Ambulatory Visit: Payer: Self-pay | Admitting: Internal Medicine

## 2022-06-22 ENCOUNTER — Encounter: Payer: Self-pay | Admitting: Internal Medicine

## 2022-06-22 ENCOUNTER — Ambulatory Visit (INDEPENDENT_AMBULATORY_CARE_PROVIDER_SITE_OTHER): Payer: Medicare Other | Admitting: Internal Medicine

## 2022-06-22 VITALS — BP 126/68 | HR 59 | Temp 97.6°F | Resp 18 | Ht 67.0 in | Wt 194.5 lb

## 2022-06-22 DIAGNOSIS — E781 Pure hyperglyceridemia: Secondary | ICD-10-CM

## 2022-06-22 DIAGNOSIS — I498 Other specified cardiac arrhythmias: Secondary | ICD-10-CM

## 2022-06-22 DIAGNOSIS — E1169 Type 2 diabetes mellitus with other specified complication: Secondary | ICD-10-CM

## 2022-06-22 DIAGNOSIS — M159 Polyosteoarthritis, unspecified: Secondary | ICD-10-CM

## 2022-06-22 DIAGNOSIS — I10 Essential (primary) hypertension: Secondary | ICD-10-CM

## 2022-06-22 DIAGNOSIS — Z1211 Encounter for screening for malignant neoplasm of colon: Secondary | ICD-10-CM

## 2022-06-22 LAB — LIPID PANEL
Cholesterol: 93 mg/dL (ref 0–200)
HDL: 34.9 mg/dL — ABNORMAL LOW (ref >39.00)
LDL Cholesterol: 36 mg/dL (ref 0–99)
NonHDL: 58.04
Total CHOL/HDL Ratio: 3
Triglycerides: 110 mg/dL (ref 0.0–149.0)
VLDL: 22 mg/dL (ref 0.0–40.0)

## 2022-06-22 LAB — BASIC METABOLIC PANEL
BUN: 20 mg/dL (ref 6–23)
CO2: 26 mEq/L (ref 19–32)
Calcium: 8.9 mg/dL (ref 8.4–10.5)
Chloride: 104 mEq/L (ref 96–112)
Creatinine, Ser: 1.08 mg/dL (ref 0.40–1.50)
GFR: 64.11 mL/min (ref >60.00)
Glucose, Bld: 102 mg/dL — ABNORMAL HIGH (ref 70–99)
Potassium: 4.4 mEq/L (ref 3.5–5.1)
Sodium: 140 mEq/L (ref 135–145)

## 2022-06-22 LAB — ALT: ALT: 13 U/L (ref 0–53)

## 2022-06-22 LAB — AST: AST: 15 U/L (ref 0–37)

## 2022-06-22 LAB — HEMOGLOBIN A1C: Hgb A1c MFr Bld: 6.5 % (ref 4.6–6.5)

## 2022-06-22 NOTE — Patient Instructions (Addendum)
Vaccines I recommend:  Shingrix (shingles) RSV vaccine   Continue with Tylenol and icing for hip pain Avoid any anti-inflammatories such as ibuprofen, naproxen, etc.   Check the  blood pressure regularly BP GOAL is between 110/65 and  135/85. If it is consistently higher or lower, let me know    GO TO THE LAB : Get the blood work     [place], [place] Come back for a physical exam by 10-2022   Per our records you are due for your diabetic eye exam. Please contact your eye doctor to schedule an appointment. Please have them send copies of your office visit notes to [place]. Our fax number is [phone]. If you need a referral to an eye doctor please let us know.

## 2022-06-22 NOTE — Progress Notes (Unsigned)
Subjective:    Patient ID: Timothy Stone., male    DOB: July 24, 1941, 81 y.o.   MRN: [id number]  DOS:  06/22/2022 Type of visit - description: f/u  Here for routine checkup, chronic medical problems were addressed. He also had questions about vaccines. Wonders about the need of a colonoscopy Wonders about the need of a hip replacement, has bilateral hip pain mostly when he goes to a cruise and walks more than usual. He goes to the gym frequently, walks on the treadmill without major pains.  Review of Systems See above   Past Medical History:  Diagnosis Date   Actinic keratosis    hyperplastic aktinic keratosis forehead s/p surgery Dr Link Snuffer   BPH (benign prostatic hyperplasia)    s/p TUNA   Chronic cystitis    Dr. Lovena Neighbours   DVT (deep venous thrombosis) ([place]) 2005   no know triggers per pt   HTN (hypertension)    dx in the 70s   OSA (obstructive sleep apnea)    on CPAP   Recurrent UTI    Umbilical hernia    no surgery   Vitamin D deficiency     Past Surgical History:  Procedure Laterality Date   PROSTATE SURGERY  2003   TUNA around 2003    Current Outpatient Medications  Medication Instructions   acetaminophen (TYLENOL) 1,000 mg, Oral, Daily PRN,     azelastine (ASTELIN) 0.1 % nasal spray 2 sprays, Each Nare, 2 times daily, Use in each nostril as directed   benzonatate (TESSALON) 100 mg, Oral, 3 times daily PRN   bisoprolol-hydrochlorothiazide (ZIAC) 5-6.25 MG tablet TAKE 1 TABLET BY MOUTH DAILY .  OKAY TO TAKE A SECOND TABLET IF  BLOOD PRESSURE IS MORE THAN  140/85   doxazosin (CARDURA) 4 mg, Oral, Daily at bedtime   ELIQUIS 2.5 MG TABS tablet TAKE 1 TABLET BY MOUTH  TWICE DAILY   Flaxseed Oil OIL 1,400 mg, Oral, Daily   fluticasone (FLONASE) 50 MCG/ACT nasal spray USE 2 SPRAYS IN BOTH  NOSTRILS DAILY AS NEEDED  FOR ALLERGIES OR RHINITIS   loratadine (CLARITIN) 10 mg, Oral, Daily   OVER THE COUNTER MEDICATION 1 tablet, Oral, Daily, Prostate Plus Health  Complex w/ Cran-Max-Cranberry 1 daily    rosuvastatin (CRESTOR) 10 mg, Oral, Daily   Vitamin D3 2,000 Units, Oral, Daily       Objective:   Physical Exam BP 126/68   Pulse (!) 59   Temp 97.6 F (36.4 C) (Oral)   Resp 18   Ht 5\' 7"  (1.702 m)   Wt 194 lb 8 oz (88.2 kg)   SpO2 97%   BMI 30.46 kg/m  General:   Well developed, NAD, BMI noted. HEENT:  Normocephalic . Face symmetric, atraumatic Lungs:  CTA B Normal respiratory effort, no intercostal retractions, no accessory muscle use. Heart: RRR,  no murmur.  Lower extremities: no pretibial edema bilaterally No TTP at the trochanteric bursa's Hip rotation normal bilaterally Skin: Not pale. Not jaundice Neurologic:  alert & oriented X3.  Speech normal, gait appropriate for age and unassisted Psych--  Cognition and judgment appear intact.  Cooperative with normal attention span and concentration.  Behavior appropriate. No anxious or depressed appearing.      Assessment   Assessment: DM (A1c 6.5 2020) HTN Bigeminy: Saw cardiology 2019, Holter: 80% QRS with PVCs.  Echo was done, see report BPH, recurrent UTIs, chronic cystitis OSA, on CPAP Umbilical hernia Actinic keratosis; no derm eval recently as off  02/2018 Tinnitus  H/o vitamin D deficiency H/o R leg  DVT, 2005 and 2020, unprovoked, hematology Rx lifelong anticoagulant   PLAN: DM: Diet controlled, has a very healthy lifestyle.  Check A1c HTN: On Ziac, Cardura.  BP today is very good, check a BMP Dyslipidemia: LDL 10-2021 was 106, given history of DM he was recommended to statins, tolerate rosuvastatin very well, started April 2023.  Plan: FLP AST ALT. Bigeminy Saw cardiology 01/27/2022, felt to be stable Lifelong anticoagulation: Tolerating Eliquis. DJD: Having hip pain bilaterally, able to do all his ADLs and go to the gym, pain is bothersome only when he takes long walks when he goes to a cruise.  Pros and cons of hip replacement discussed, we agreed that he is  doing well with conservative treatment with Tylenol and icing. COVID: Diagnosed last month, he feels fully recuperated. Preventive care: Had a flu and a COVID-vaccine updated. Plans to proceed with RSV and Shingrix CCS: Pros and cons of proceeding with a colonoscopy at his age discussed, we elected GI referral to see what they think. RTC CPX 10/2022    Here for CPX Prediabetes: Has a healthy lifestyle, check A1c HTN: Ambulatory.  Range from 100/60 to 140/85.  From time to time BP is low and he delays his BP med intake (see last visit).  We agreed  to continue Ziac, Cardura. Bigeminy: Asymptomatic, anticoagulated RTC 8 months.

## 2022-06-23 ENCOUNTER — Telehealth: Payer: Self-pay | Admitting: Internal Medicine

## 2022-06-23 DIAGNOSIS — M1990 Unspecified osteoarthritis, unspecified site: Secondary | ICD-10-CM | POA: Insufficient documentation

## 2022-06-23 DIAGNOSIS — I498 Other specified cardiac arrhythmias: Secondary | ICD-10-CM | POA: Insufficient documentation

## 2022-06-23 NOTE — Assessment & Plan Note (Signed)
DM: Diet controlled, has a very healthy lifestyle.  Check A1c HTN: On Ziac, Cardura.  BP today is very good, check a BMP Dyslipidemia: LDL 10-2021 was 106, given history of DM he was recommended to statins, tolerate rosuvastatin very well, started April 2023.  Plan: FLP AST ALT. 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  Pros and cons of hip replacement discussed, we agreed that he is doing well with conservative treatment with Tylenol and icing thus in my opinion is better to avoid surgery for now. COVID: Diagnosed last month, he feels fully recuperated. Preventive care: Had a flu and a COVID-vaccine updated. Plans to proceed with RSV and Shingrix CCS: Pros and cons of proceeding with a colonoscopy at his age discussed, we elected GI referral to see what they think. RTC CPX 10/2022

## 2022-06-23 NOTE — Telephone Encounter (Signed)
Pt just wanted pcp to be aware he did schedule a colonoscopy.

## 2022-06-23 NOTE — Telephone Encounter (Signed)
Thank you, noted.

## 2022-06-23 NOTE — Telephone Encounter (Signed)
FYI- cscope scheduled for 08/25/22 w/ [facility]. He has been cleared by cardiology already.

## 2022-07-06 ENCOUNTER — Ambulatory Visit (INDEPENDENT_AMBULATORY_CARE_PROVIDER_SITE_OTHER): Payer: Medicare Other | Admitting: *Deleted

## 2022-07-06 VITALS — BP 120/78 | HR 53 | Ht 67.0 in | Wt 197.6 lb

## 2022-07-06 DIAGNOSIS — Z0000 Encounter for general adult medical examination without abnormal findings: Secondary | ICD-10-CM | POA: Diagnosis not present

## 2022-07-06 NOTE — Progress Notes (Cosign Needed)
Subjective:   Bronco Mcgrory. is a 81 y.o. male who presents for Medicare Annual/Subsequent preventive examination.  Review of Systems    Defer to PCP Cardiac Risk Factors include: dyslipidemia;hypertension;male gender;advanced age (>18men, >49 women)     Objective:    Today's Vitals   07/06/22 1455 07/06/22 1500  Weight: 197 lb 9.6 oz (89.6 kg)   Height: 5\' 7"  (1.702 m)   PainSc:  2    Body mass index is 30.95 kg/m.     07/06/2022    3:02 PM 07/02/2021    3:55 PM 07/22/2020    8:29 AM 01/19/2020   10:03 AM 08/22/2019   12:24 PM 08/11/2019    4:53 PM 07/21/2019    3:34 PM  Advanced Directives  Does Patient Have a Medical Advance Directive? Yes Yes No No No No No  Type of Advance Directive Living will [place];Living will       Does patient want to make changes to medical advance directive? No - Patient declined        Copy of [place] in Chart?  No - copy requested       Would patient like information on creating a medical advance directive?    No - Patient declined No - Patient declined No - Patient declined No - Patient declined    Current Medications (verified) Outpatient Encounter Medications as of 07/06/2022  Medication Sig   acetaminophen (TYLENOL) 500 MG tablet Take 1,000 mg by mouth daily as needed for mild pain or headache.   azelastine (ASTELIN) 0.1 % nasal spray Place 2 sprays into both nostrils 2 (two) times daily. Use in each nostril as directed   bisoprolol-hydrochlorothiazide (ZIAC) 5-6.25 MG tablet TAKE 1 TABLET BY MOUTH DAILY .  OKAY TO TAKE A SECOND TABLET IF  BLOOD PRESSURE IS MORE THAN  140/85   Cholecalciferol (VITAMIN D3) 2000 units TABS Take 2,000 Units by mouth daily.    doxazosin (CARDURA) 4 MG tablet Take 1 tablet (4 mg total) by mouth at bedtime.   ELIQUIS 2.5 MG TABS tablet TAKE 1 TABLET BY MOUTH  TWICE DAILY   Flaxseed Oil OIL Take 1,400 mg by mouth daily.    fluticasone (FLONASE) 50 MCG/ACT nasal  spray USE 2 SPRAYS IN BOTH  NOSTRILS DAILY AS NEEDED  FOR ALLERGIES OR RHINITIS   loratadine (CLARITIN) 10 MG tablet Take 10 mg by mouth daily.   OVER THE COUNTER MEDICATION Take 1 tablet by mouth daily. Prostate Plus Health Complex w/ Cran-Max-Cranberry 1 daily   rosuvastatin (CRESTOR) 10 MG tablet Take 1 tablet (10 mg total) by mouth daily.   No facility-administered encounter medications on file as of 07/06/2022.    Allergies (verified) Hydrocodone, Oxycodone, and Latex   History: Past Medical History:  Diagnosis Date   Actinic keratosis    hyperplastic aktinic keratosis forehead s/p surgery Dr Link Snuffer   BPH (benign prostatic hyperplasia)    s/p TUNA   Chronic cystitis    Dr. Lovena Neighbours   Diabetes mellitus without complication ([place])    DVT (deep venous thrombosis) ([place]) 2005   no know triggers per pt   HTN (hypertension)    dx in the 70s   OSA (obstructive sleep apnea)    on CPAP   Recurrent UTI    Umbilical hernia    no surgery   Vitamin D deficiency    Past Surgical History:  Procedure Laterality Date   PROSTATE SURGERY  2003  TUNA around 2003   Family History  Problem Relation Age of Onset   Diabetes Mother        M, sister   Heart attack Father 40   Breast cancer Sister    Rectal cancer Sister 45       @ time of her first Cscope   Lung cancer Brother 71       smoker    Prostate cancer Neg Hx    Social History   Socioeconomic History   Marital status: Married    Spouse name: Not on file   Number of children: 4   Years of education: Not on file   Highest education level: Not on file  Occupational History   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Music therapist, Art gallery manager, 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  Tobacco Use   Smoking status: Never   Smokeless tobacco: Never  Vaping Use   Vaping Use: Never used  Substance and Sexual Activity   Alcohol use: Yes    Alcohol/week: 0.0 standard drinks of alcohol    Comment: 2x per week   Drug use: No   Sexual  activity: Not Currently  Other Topics Concern   Not on file  Social History Narrative   Lives w/ wife   Lost a son 04/2021         Social Determinants of Health   Financial Resource Strain: Low Risk  (02/17/2022)   Overall Financial Resource Strain (CARDIA)    Difficulty of Paying Living Expenses: Not hard at all  Food Insecurity: No Food Insecurity (07/02/2021)   Hunger Vital Sign    Worried About Running Out of Food in the Last Year: Never true    Ran Out of Food in the Last Year: Never true  Transportation Needs: No Transportation Needs (02/17/2022)   PRAPARE - Administrator, Civil Service (Medical): No    Lack of Transportation (Non-Medical): No  Physical Activity: Unknown (12/29/2021)   Exercise Vital Sign    Days of Exercise per Week: 6 days    Minutes of Exercise per Session: Not on file  Stress: No Stress Concern Present (07/02/2021)   [employer] of Occupational Health - Occupational Stress Questionnaire    Feeling of Stress : Not at all  Social Connections: Moderately Isolated (07/02/2021)   Social Connection and Isolation Panel [NHANES]    Frequency of Communication with Friends and Family: Once a week    Frequency of Social Gatherings with Friends and Family: Once a week    Attends Religious Services: More than 4 times per year    Active Member of [employer] or Organizations: No    Attends Engineer, structural: Never    Marital Status: Married    Tobacco Counseling Counseling given: Not Answered   Clinical Intake:  Pre-visit preparation completed: Yes  Pain : 0-10 Pain Score: 2  Pain Location: Back Pain Descriptors / Indicators: Aching Pain Onset: Today (was planting flowers)     Diabetes: No  How often do you need to have someone help you when you read instructions, pamphlets, or other written materials from your doctor or pharmacy?: 1 - Never   Activities of Daily Living    07/06/2022    3:03 PM  In your present state of  health, do you have any difficulty performing the following activities:  Hearing? 1  Comment slight hearing difficulty sometimes  Vision? 0  Difficulty concentrating or making decisions? 0  Walking or climbing stairs? 0  Dressing or bathing?  0  Doing errands, shopping? 0  Preparing Food and eating ? N  Using the Toilet? N  In the past six months, have you accidently leaked urine? N  Do you have problems with loss of bowel control? N  Managing your Medications? N  Managing your Finances? N  Housekeeping or managing your Housekeeping? N    Patient Care Team: Colon Branch, MD as PCP - General Tamala Julian Lynnell Dike, MD as PCP - Cardiology (Cardiology) Vickie Epley, MD as PCP - Electrophysiology (Cardiology) Ceasar Mons, MD as Consulting Physician (Urology) Ralene Ok, MD as Consulting Physician (General Surgery) Marica Otter, [place] as Consulting Physician (Optometry) Laurance Flatten Velora Heckler., MD as Consulting Physician (Otolaryngology) Cherre Robins, RPH-CPP (Pharmacist)  Indicate any recent Medical Services you may have received from other than [facility] providers in the past year (date may be approximate).     Assessment:   This is a routine wellness examination for [facility].  Hearing/Vision screen No results found.  Dietary issues and exercise activities discussed: Current Exercise Habits: Home exercise routine, Type of exercise: walking;stretching, Time (Minutes): 50, Frequency (Times/Week): 6, Weekly Exercise (Minutes/Week): 300, Intensity: Mild, Exercise limited by: None identified   Goals Addressed   None    Depression Screen    07/06/2022    3:03 PM 06/22/2022    1:01 PM 10/21/2021    1:07 PM 07/02/2021    3:59 PM 04/18/2021   12:57 PM 10/14/2020    8:58 AM 07/21/2019    3:37 PM  PHQ 2/9 Scores  PHQ - 2 Score 0 0 0 0 0 0 0    Fall Risk    07/06/2022    3:02 PM 06/22/2022    1:01 PM 10/21/2021    1:07 PM 07/02/2021    3:57 PM 04/18/2021   12:58 PM   [place] in the past year? 0 0 0 0 0  Number falls in past yr: 0 0 0 0 0  Injury with Fall? 0 0 0 0 0  Risk for fall due to : No Fall Risks      Follow up Falls evaluation completed Falls evaluation completed Falls evaluation completed  Falls evaluation completed    [place]:  Any stairs in or around the home? Yes  If so, are there any without handrails? No  Home free of loose throw rugs in walkways, pet beds, electrical cords, etc? Yes  Adequate lighting in your home to reduce risk of falls? Yes   ASSISTIVE DEVICES UTILIZED TO PREVENT FALLS:  Life alert? No  Use of a cane, walker or w/c? No  Grab bars in the bathroom? No  Shower chair or bench in shower? Yes  Elevated toilet seat or a handicapped toilet? No   TIMED UP AND GO:  Was the test performed? Yes .  Length of time to ambulate 10 feet: 6 sec.   Gait steady and fast without use of assistive device  Cognitive Function:    04/26/2017    9:17 AM 04/06/2016    1:31 PM  MMSE - Mini Mental State Exam  Orientation to time 5 5  Orientation to Place 5 5  Registration 3 3  Attention/ Calculation 5 5  Recall 3 3  Language- name 2 objects 2 2  Language- repeat 1 1  Language- follow 3 step command 3 3  Language- read & follow direction 1 1  Write a sentence 1 1  Copy design  1 1  Total score 30 30        07/06/2022    3:09 PM 07/02/2021    4:09 PM  6CIT Screen  What Year? 0 points 0 points  What month? 0 points 0 points  What time? 0 points 0 points  Count back from 20 0 points 0 points  Months in reverse 0 points 0 points  Repeat phrase 2 points 0 points  Total Score 2 points 0 points    Immunizations Immunization History  Administered Date(s) Administered   Fluad Quad(high Dose 65+) 05/24/2019, 06/12/2020, 07/02/2021   Influenza Split 06/07/2014, 06/12/2022   Influenza Whole 07/28/2007, 05/26/2010   Influenza, High Dose Seasonal PF 07/02/2015, 05/27/2016,  06/17/2017, 06/16/2018   PFIZER(Purple Top)SARS-COV-2 Vaccination 09/22/2019, 10/13/2019, 07/12/2020, 01/16/2021   Pfizer Covid-19 Vaccine Bivalent Booster 39yrs & up 07/02/2021, 06/12/2022   Pneumococcal Conjugate-13 02/16/2014   Pneumococcal Polysaccharide-23 05/18/2008, 03/02/2018   Td 09/08/2003   Tetanus 02/16/2014   Zoster, Live 05/26/2010    TDAP status: Up to date  Flu Vaccine status: Up to date  Pneumococcal vaccine status: Up to date  Covid-19 vaccine status: Completed vaccines  Qualifies for Shingles Vaccine? Yes   Zostavax completed Yes   Shingrix Completed?: No.    Education has been provided regarding the importance of this vaccine. Patient has been advised to call insurance company to determine out of pocket expense if they have not yet received this vaccine. Advised may also receive vaccine at local pharmacy or Health Dept. Verbalized acceptance and understanding.  Screening Tests Health Maintenance  Topic Date Due   FOOT EXAM  Never done   OPHTHALMOLOGY EXAM  Never done   Zoster Vaccines- Shingrix (1 of 2) Never done   COLONOSCOPY (Pts 45-48yrs Insurance coverage will need to be confirmed)  03/30/2022   Medicare Annual Wellness (AWV)  07/02/2022   Diabetic kidney evaluation - Urine ACR  06/23/2027 (Originally 06/02/1959)   HEMOGLOBIN A1C  12/22/2022   Diabetic kidney evaluation - GFR measurement  06/23/2023   TETANUS/TDAP  02/17/2024   Pneumonia Vaccine 83+ Years old  Completed   INFLUENZA VACCINE  Completed   COVID-19 Vaccine  Completed   HPV VACCINES  Aged Out    Health Maintenance  Health Maintenance Due  Topic Date Due   FOOT EXAM  Never done   OPHTHALMOLOGY EXAM  Never done   Zoster Vaccines- Shingrix (1 of 2) Never done   COLONOSCOPY (Pts 45-87yrs Insurance coverage will need to be confirmed)  03/30/2022   Medicare Annual Wellness (AWV)  07/02/2022    Colorectal cancer screening: Type of screening: Colonoscopy. Completed 03/30/17. Repeat every 5  years  Lung Cancer Screening: (Low Dose CT Chest recommended if Age 52-80 years, 30 pack-year currently smoking OR have quit w/in 15years.) does not qualify.   Lung Cancer Screening Referral: N/a  Additional Screening:  Hepatitis C Screening: does not qualify  Vision Screening: Recommended annual ophthalmology exams for early detection of glaucoma and other disorders of the eye. Is the patient up to date with their annual eye exam?  Yes  Who is the provider or what is the name of the office in which the patient attends annual eye exams? Dr. Sabra Heck If pt is not established with a provider, would they like to be referred to a provider to establish care? No .   Dental Screening: Recommended annual dental exams for proper oral hygiene  Community Resource Referral / Chronic Care Management: CRR required this visit?  No  CCM required this visit?  No      Plan:     I have personally reviewed and noted the following in the patient's chart:   Medical and social history Use of alcohol, tobacco or illicit drugs  Current medications and supplements including opioid prescriptions. Patient is not currently taking opioid prescriptions. Functional ability and status Nutritional status Physical activity Advanced directives List of other physicians Hospitalizations, surgeries, and ER visits in previous 12 months Vitals Screenings to include cognitive, depression, and falls Referrals and appointments  In addition, I have reviewed and discussed with patient certain preventive protocols, quality metrics, and best practice recommendations. A written personalized care plan for preventive services as well as general preventive health recommendations were provided to patient.     Beatris Ship, [place]   07/06/2022   Nurse Notes: None  I have reviewed and agree with Health Coaches documentation.  Kathlene November, MD

## 2022-07-06 NOTE — Patient Instructions (Signed)
Mr. Timothy Stone , Thank you for taking time to come for your Medicare Wellness Visit. I appreciate your ongoing commitment to your health goals. Please review the following plan we discussed and let me know if I can assist you in the future.   These are the goals we discussed:  Goals       Chronic Care Management Pharmacy Care Plan      CARE PLAN ENTRY (see longitudinal plan of care for additional care plan information)  Current Barriers:  Chronic Disease Management support, education, and care coordination needs related to Hypertension, Hyperlipidemia, Pre-Diabetes, Hx of DVT, BPH   Hypertension BP Readings from Last 3 Encounters:  01/27/22 (!) 142/80  10/21/21 128/86  07/02/21 (!) 150/78  Pharmacist Clinical Goal(s): Over the next 180 days, patient will work with PharmD and providers to maintain BP goal <140/90 Current regimen:  Bisoprolol-hydrochlorothiazide 5-6.25mg  once each morning (will take additional 1/2 to 1 tablet depending on BP in afternoon) Interventions: Discussed blood pressure  goal Patient self care activities - Over the next 180 days, patient will: Maintain hypertension medication regimen.  Continue to check blood pressure twice a day and adjust bisoprolol hydrochlorothiazide according to Dr Leta Jungling recommendations  Hyperlipidemia Lab Results  Component Value Date/Time   LDLCALC 102 (H) 10/14/2020 09:31 AM   LDLDIRECT 106.0 10/21/2021 01:31 PM  Pharmacist Clinical Goal(s): Over the next 180 days, patient will work with PharmD and providers to achieve LDL goal < 100 or prevent LDL from increaseing Current regimen:  Flaxseed Oil 1400mg  daily Rosuvastatin 10mg  daily (started 12/29/2021) Interventions: Discussed LDL goal Discuss statin as an option to get LDL <100 Patient self care activities - Over the next 180 dfays, patient will: Continue rosuvastatin 10mg  daily and Flaxseed Oil Continue to exercise and limit intake of saturated fat.  Pre-Diabetes Lab Results   Component Value Date/Time   HGBA1C 5.9 10/14/2020 09:31 AM   HGBA1C 6.2 11/21/2019 01:29 PM  Pharmacist Clinical Goal(s): Over the next 180 days, patient will work with PharmD and providers to maintain A1c goal <6.5% Current regimen:  Diet and exercise management   Interventions: Discussed a1c goal Patient self care activities - Over the next 180 days, patient will: Maintain a1c <6.5%  BPH:  Pharmacist Clinical Goal(s): Over the next 180 days, patient will work with PharmD and providers to maintain control of nocturia while minimizing side effects to medication Current regimen:  Doxazosin 4mg  - take 1 tablet at bedtime Interventions: Reminded patient to take at night to lower daytime dizziness Patient self care activities - Over the next 180 days, patient will: Continue current regimen   Medication management Pharmacist Clinical Goal(s): Over the next 180 days, patient will work with PharmD and providers to maintain optimal medication adherence Current pharmacy: [facility] Interventions Comprehensive medication review performed. Continue current medication management strategy Patient self care activities - Over the next 180 days, patient will: Focus on medication adherence by filling and taking medications appropriately Take medications as prescribed Report any questions or concerns to PharmD and/or provider(s)  Patient Goals/Self-Care Activities take medications as prescribed,  check blood pressure daily, document, and provide at future appointments, and  continue to exercise regularly Continue rosuvastatin 10mg  daily to lower cholesterol and triglycerides.          Patient Stated (pt-stated)      Lose 15 lbs.      Patient Stated      Maintain health active lifestyle.  This is a list of the screening recommended for you and due dates:  Health Maintenance  Topic Date Due   Complete foot exam   Never done   Eye exam for diabetics  Never done    Zoster (Shingles) Vaccine (1 of 2) Never done   Colon Cancer Screening  03/30/2022   Yearly kidney health urinalysis for diabetes  06/23/2027*   Hemoglobin A1C  12/22/2022   Yearly kidney function blood test for diabetes  06/23/2023   Medicare Annual Wellness Visit  07/07/2023   Tetanus Vaccine  02/17/2024   Pneumonia Vaccine  Completed   Flu Shot  Completed   COVID-19 Vaccine  Completed   HPV Vaccine  Aged Out  *Topic was postponed. The date shown is not the original due date.     Next appointment: Follow up in one year for your annual wellness visit.  Preventive Care 31 Years and Older, Male Preventive care refers to lifestyle choices and visits with your health care provider that can promote health and wellness. What does preventive care include? A yearly physical exam. This is also called an annual well check. Dental exams once or twice a year. Routine eye exams. Ask your health care provider how often you should have your eyes checked. Personal lifestyle choices, including: Daily care of your teeth and gums. Regular physical activity. Eating a healthy diet. Avoiding tobacco and drug use. Limiting alcohol use. Practicing safe sex. Taking low doses of aspirin every day. Taking vitamin and mineral supplements as recommended by your health care provider. What happens during an annual well check? The services and screenings done by your health care provider during your annual well check will depend on your age, overall health, lifestyle risk factors, and family history of disease. Counseling  Your health care provider may ask you questions about your: Alcohol use. Tobacco use. Drug use. Emotional well-being. Home and relationship well-being. Sexual activity. Eating habits. History of falls. Memory and ability to understand (cognition). Work and work Statistician. Screening  You may have the following tests or measurements: Height, weight, and BMI. Blood  pressure. Lipid and cholesterol levels. These may be checked every 5 years, or more frequently if you are over 35 years old. Skin check. Lung cancer screening. You may have this screening every year starting at age 22 if you have a 30-pack-year history of smoking and currently smoke or have quit within the past 15 years. Fecal occult blood test (FOBT) of the stool. You may have this test every year starting at age 22. Flexible sigmoidoscopy or colonoscopy. You may have a sigmoidoscopy every 5 years or a colonoscopy every 10 years starting at age 16. Prostate cancer screening. Recommendations will vary depending on your family history and other risks. Hepatitis C blood test. Hepatitis B blood test. Sexually transmitted disease (STD) testing. Diabetes screening. This is done by checking your blood sugar (glucose) after you have not eaten for a while (fasting). You may have this done every 1-3 years. Abdominal aortic aneurysm (AAA) screening. You may need this if you are a current or former smoker. Osteoporosis. You may be screened starting at age 35 if you are at high risk. Talk with your health care provider about your test results, treatment options, and if necessary, the need for more tests. Vaccines  Your health care provider may recommend certain vaccines, such as: Influenza vaccine. This is recommended every year. Tetanus, diphtheria, and acellular pertussis (Tdap, Td) vaccine. You may need a Td booster every 10  years. Zoster vaccine. You may need this after age 86. Pneumococcal 13-valent conjugate (PCV13) vaccine. One dose is recommended after age 21. Pneumococcal polysaccharide (PPSV23) vaccine. One dose is recommended after age 57. Talk to your health care provider about which screenings and vaccines you need and how often you need them. This information is not intended to replace advice given to you by your health care provider. Make sure you discuss any questions you have with your  health care provider. Document Released: 09/20/2015 Document Revised: 05/13/2016 Document Reviewed: 06/25/2015 Elsevier Interactive Patient Education  2017 [employer].  Fall Prevention in the Home Falls can cause injuries. They can happen to people of all ages. There are many things you can do to make your home safe and to help prevent falls. What can I do on the outside of my home? Regularly fix the edges of walkways and driveways and fix any cracks. Remove anything that might make you trip as you walk through a door, such as a raised step or threshold. Trim any bushes or trees on the path to your home. Use bright outdoor lighting. Clear any walking paths of anything that might make someone trip, such as rocks or tools. Regularly check to see if handrails are loose or broken. Make sure that both sides of any steps have handrails. Any raised decks and porches should have guardrails on the edges. Have any leaves, snow, or ice cleared regularly. Use sand or salt on walking paths during winter. Clean up any spills in your garage right away. This includes oil or grease spills. What can I do in the bathroom? Use night lights. Install grab bars by the toilet and in the tub and shower. Do not use towel bars as grab bars. Use non-skid mats or decals in the tub or shower. If you need to sit down in the shower, use a plastic, non-slip stool. Keep the floor dry. Clean up any water that spills on the floor as soon as it happens. Remove soap buildup in the tub or shower regularly. Attach bath mats securely with double-sided non-slip rug tape. Do not have throw rugs and other things on the floor that can make you trip. What can I do in the bedroom? Use night lights. Make sure that you have a light by your bed that is easy to reach. Do not use any sheets or blankets that are too big for your bed. They should not hang down onto the floor. Have a firm chair that has side arms. You can use this for  support while you get dressed. Do not have throw rugs and other things on the floor that can make you trip. What can I do in the kitchen? Clean up any spills right away. Avoid walking on wet floors. Keep items that you use a lot in easy-to-reach places. If you need to reach something above you, use a strong step stool that has a grab bar. Keep electrical cords out of the way. Do not use floor polish or wax that makes floors slippery. If you must use wax, use non-skid floor wax. Do not have throw rugs and other things on the floor that can make you trip. What can I do with my stairs? Do not leave any items on the stairs. Make sure that there are handrails on both sides of the stairs and use them. Fix handrails that are broken or loose. Make sure that handrails are as long as the stairways. Check any carpeting to make sure  that it is firmly attached to the stairs. Fix any carpet that is loose or worn. Avoid having throw rugs at the top or bottom of the stairs. If you do have throw rugs, attach them to the floor with carpet tape. Make sure that you have a light switch at the top of the stairs and the bottom of the stairs. If you do not have them, ask someone to add them for you. What else can I do to help prevent falls? Wear shoes that: Do not have high heels. Have rubber bottoms. Are comfortable and fit you well. Are closed at the toe. Do not wear sandals. If you use a stepladder: Make sure that it is fully opened. Do not climb a closed stepladder. Make sure that both sides of the stepladder are locked into place. Ask someone to hold it for you, if possible. Clearly mark and make sure that you can see: Any grab bars or handrails. First and last steps. Where the edge of each step is. Use tools that help you move around (mobility aids) if they are needed. These include: Canes. Walkers. Scooters. Crutches. Turn on the lights when you go into a dark area. Replace any light bulbs as soon  as they burn out. Set up your furniture so you have a clear path. Avoid moving your furniture around. If any of your floors are uneven, fix them. If there are any pets around you, be aware of where they are. Review your medicines with your doctor. Some medicines can make you feel dizzy. This can increase your chance of falling. Ask your doctor what other things that you can do to help prevent falls. This information is not intended to replace advice given to you by your health care provider. Make sure you discuss any questions you have with your health care provider. Document Released: 06/20/2009 Document Revised: 01/30/2016 Document Reviewed: 09/28/2014 Elsevier Interactive Patient Education  2017 [employer].

## 2022-07-10 ENCOUNTER — Encounter: Payer: Self-pay | Admitting: Internal Medicine

## 2022-08-19 ENCOUNTER — Ambulatory Visit: Payer: Medicare Other | Admitting: Pharmacist

## 2022-08-19 DIAGNOSIS — E781 Pure hyperglyceridemia: Secondary | ICD-10-CM

## 2022-08-19 DIAGNOSIS — I10 Essential (primary) hypertension: Secondary | ICD-10-CM

## 2022-08-19 NOTE — Progress Notes (Signed)
Pharmacy Note  08/19/2022 Name: Imir Brumbach. MRN: [id number] DOB: [birth year]  Subjective: Timothy Stone. is a 81 y.o. year old male who is a primary care patient of Colon Branch, MD. Clinical Pharmacist Practitioner referral was placed to assist with medication management.    Engaged with patient by telephone for follow up visit today.  Hyperlipidemia: Patient is taking rosuvastatin since 12/29/2021. LDL has decreased from 106 to 36 since starting rosuvastatin and triglycerides have improved from 205 to 110.   Hypertension:  Recent blood pressure in office has been at goal. Patient is taking bisoprolol hydrochlorothiazide once daily (takes a second dose if needed for elevated blood pressure or HR).  He continues to exercise every day about 60 minutes at either [employer] [facility].   History of DVT:  Taking Eliquis 2.53m twice a day. Patient states he is on Medicare coverage gap - just filled 90 day supply 08/03/2022. He was able to afford. Benefits will restart with next fill.   Objective: Review of patient status, including review of consultants reports, laboratory and other test data, was performed as part of comprehensive.  Lab Results  Component Value Date   CREATININE 1.08 06/22/2022   CREATININE 1.11 10/21/2021   CREATININE 1.19 04/18/2021    Lab Results  Component Value Date   HGBA1C 6.5 06/22/2022       Component Value Date/Time   CHOL 93 06/22/2022 1333   TRIG 110.0 06/22/2022 1333   HDL 34.90 (L) 06/22/2022 1333   CHOLHDL 3 06/22/2022 1333   VLDL 22.0 06/22/2022 1333   LDLCALC 36 06/22/2022 1333   LDLDIRECT 106.0 10/21/2021 1331     Clinical ASCVD: No  The ASCVD Risk score (Arnett DK, et al., 2019) failed to calculate for the following reasons:   The 2019 ASCVD risk score is only valid for ages 453to 775   BP Readings from Last 3 Encounters:  07/06/22 120/78  06/22/22 126/68  05/22/22 128/78     Allergies  Allergen  Reactions   Hydrocodone Shortness Of Breath   Oxycodone    Latex Hives    Medications Reviewed Today     Reviewed by ECherre Robins RPH-CPP (Pharmacist) on 08/19/22 at 1313  Med List Status: <None>   Medication Order Taking? Sig Documenting Provider Last Dose Status Informant  acetaminophen (TYLENOL) 500 MG tablet [id number] Take 1,000 mg by mouth daily as needed for mild pain or headache. [provider] Taking Active Self           Med Note ([facility], CARLOS A   Thu Mar 28, 2020  9:17 AM)    azelastine (ASTELIN) 0.1 % nasal spray [id number] Place 2 sprays into both nostrils 2 (two) times daily. Use in each nostril as directed MMarrian Salvage FNP Taking Active   bisoprolol-hydrochlorothiazide ([facility] 5-6.25 MG tablet [id number] TAKE 1 TABLET BY MOUTH DAILY .  OKAY TO TAKE A SECOND TABLET IF  BLOOD PRESSURE IS MORE THAN  140/85 PColon Branch MD Taking Active   Cholecalciferol (VITAMIN D3) 2000 units TABS [id number] Take 2,000 Units by mouth daily.  [provider] Taking Active Self  doxazosin (CARDURA) 4 MG tablet [id number] Take 1 tablet (4 mg total) by mouth at bedtime. PColon Branch MD Taking Active Self  ELIQUIS 2.5 MG TABS tablet [id number] TAKE 1 TABLET BY MOUTH  TWICE DAILY Ennever, PRudell Cobb MD Taking Active  Flaxseed Oil OIL [id number] Yes Take 1,400 mg by mouth daily.  [provider] Taking Active Self  fluticasone (FLONASE) 50 MCG/ACT nasal spray [id number] Yes USE 2 SPRAYS IN BOTH  NOSTRILS DAILY AS NEEDED  FOR ALLERGIES OR RHINITIS Colon Branch, MD Taking Active   loratadine (CLARITIN) 10 MG tablet [id number] Yes Take 10 mg by mouth daily. [provider] Taking Active   OVER THE [place] [id number] Yes Take 1 tablet by mouth daily. Prostate Plus Health Complex w/ Cran-Max-Cranberry 1 daily [provider] Taking Active Self  rosuvastatin (CRESTOR) 10 MG tablet [id number] Yes Take 1 tablet (10 mg total)  by mouth daily. 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  Patient affirms current coverage meets needs. Patient does not qualify for Eliquis patient assistance program.   Assessment / Plan: Hyperlipidemia: At LDL and Triglyceride goal. HDLC still a little low Continue rosuvastatin  Continue to exercise daily.   Hypertension: controlled Continue bisoprolol hydrochlorothiazide once daily (take a second dose if needed for elevated blood pressure or HR).   History of DVT:  Patient is aware to hold Eliquis 48 hours prior to upcoming colonoscopy 08/25/2022.   Medication management:  Reviewed and updated medication list Reviewed refill history and adherence  Health Maintenance:  Patient already scheduled for colonoscopy 08/25/2022 Reminded to get Shingrix vaccine series.  Reminded eye exam due in early 2024.    Follow Up:  No further follow up required: Patient has met    Cherre Robins, PharmD Clinical Pharmacist [facility] [place] [phone]

## 2022-08-25 DIAGNOSIS — D123 Benign neoplasm of transverse colon: Secondary | ICD-10-CM | POA: Diagnosis not present

## 2022-08-25 DIAGNOSIS — K5730 Diverticulosis of large intestine without perforation or abscess without bleeding: Secondary | ICD-10-CM | POA: Diagnosis not present

## 2022-08-25 DIAGNOSIS — K635 Polyp of colon: Secondary | ICD-10-CM | POA: Diagnosis not present

## 2022-08-25 DIAGNOSIS — Z1211 Encounter for screening for malignant neoplasm of colon: Secondary | ICD-10-CM | POA: Diagnosis not present

## 2022-08-25 DIAGNOSIS — D124 Benign neoplasm of descending colon: Secondary | ICD-10-CM | POA: Diagnosis not present

## 2022-08-25 DIAGNOSIS — K640 First degree hemorrhoids: Secondary | ICD-10-CM | POA: Diagnosis not present

## 2022-09-27 ENCOUNTER — Other Ambulatory Visit: Payer: Self-pay | Admitting: Hematology & Oncology

## 2022-09-27 ENCOUNTER — Other Ambulatory Visit: Payer: Self-pay | Admitting: Internal Medicine

## 2022-09-27 DIAGNOSIS — I824Z1 Acute embolism and thrombosis of unspecified deep veins of right distal lower extremity: Secondary | ICD-10-CM

## 2022-10-15 ENCOUNTER — Encounter: Payer: Self-pay | Admitting: Internal Medicine

## 2022-10-26 ENCOUNTER — Encounter: Payer: Medicare Other | Admitting: Internal Medicine

## 2022-10-28 ENCOUNTER — Encounter: Payer: Medicare Other | Admitting: Internal Medicine

## 2022-11-09 ENCOUNTER — Encounter: Payer: Self-pay | Admitting: Internal Medicine

## 2022-11-09 ENCOUNTER — Other Ambulatory Visit: Payer: Self-pay | Admitting: Internal Medicine

## 2022-11-09 ENCOUNTER — Ambulatory Visit (INDEPENDENT_AMBULATORY_CARE_PROVIDER_SITE_OTHER): Payer: Medicare Other | Admitting: Internal Medicine

## 2022-11-09 VITALS — BP 126/80 | HR 56 | Temp 98.0°F | Resp 16 | Ht 67.0 in | Wt 199.5 lb

## 2022-11-09 DIAGNOSIS — B349 Viral infection, unspecified: Secondary | ICD-10-CM | POA: Diagnosis not present

## 2022-11-09 NOTE — Assessment & Plan Note (Signed)
Viral syndrome: The patient had a viral illness, apparently not severe, and is feeling better. Plan: Conservative treatment, see AVS, due for a CPX, schedule at his convenience

## 2022-11-09 NOTE — Patient Instructions (Addendum)
   GO TO THE FRONT DESK, PLEASE SCHEDULE YOUR APPOINTMENTS Come back for   a physical at your convenience    Rest, fluids , tylenol  For cough:  Take Mucinex DM or Robitussin-DM OTC.  Follow the instructions in the box.  Call if not gradually better over the next  10 days   Call anytime if the symptoms are severe, you have high fever, short of breath, chest pain   Per our records you are due for your diabetic eye exam. Please contact your eye doctor to schedule an appointment. Please have them send copies of your office visit notes to [place]. Our fax number is [phone]. If you need a referral to an eye doctor please let us know.

## 2022-11-09 NOTE — Progress Notes (Signed)
Subjective:    Patient ID: Timothy Stone., male    DOB: 03/28/1941, 82 y.o.   MRN: [id number]  DOS:  11/09/2022 Type of visit - description: Acute  Returned  from a cruise around February 19. Several days later he developed a virus: Headache, cough, chest and sinus congestion. Does not recall having a fever. No nausea vomiting or diarrhea.  At this point he is feeling better, he only took few OTC medications for cough.   Review of Systems See above   Past Medical History:  Diagnosis Date   Actinic keratosis    hyperplastic aktinic keratosis forehead s/p surgery Dr Link Snuffer   BPH (benign prostatic hyperplasia)    s/p TUNA   Chronic cystitis    Dr. Lovena Neighbours   Diabetes mellitus without complication [facility])    DVT (deep venous thrombosis) ([place]) 2005   no know triggers per pt   HTN (hypertension)    dx in the 70s   OSA (obstructive sleep apnea)    on CPAP   Recurrent UTI    Umbilical hernia    no surgery   Vitamin D deficiency     Past Surgical History:  Procedure Laterality Date   PROSTATE SURGERY  2003   TUNA around 2003    Current Outpatient Medications  Medication Instructions   acetaminophen (TYLENOL) 1,000 mg, Oral, Daily PRN,     azelastine (ASTELIN) 0.1 % nasal spray 2 sprays, Each Nare, 2 times daily, Use in each nostril as directed   bisoprolol-hydrochlorothiazide (ZIAC) 5-6.25 MG tablet TAKE 1 TABLET BY MOUTH DAILY .  OKAY TO TAKE A SECOND TABLET IF  BLOOD PRESSURE IS MORE THAN  140/85   Chlorphen-Pseudoephed-APAP (CORICIDIN D PO) 1 tablet, Oral, Daily PRN   diclofenac Sodium (VOLTAREN) 2 g, Topical, 4 times daily   doxazosin (CARDURA) 4 mg, Oral, Daily at bedtime   ELIQUIS 2.5 MG TABS tablet TAKE 1 TABLET BY MOUTH TWICE  DAILY   Flaxseed Oil OIL 1,400 mg, Oral, Daily   fluticasone (FLONASE) 50 MCG/ACT nasal spray USE 2 SPRAYS IN BOTH NOSTRILS  DAILY AS NEEDED FOR ALLERGIES OR RHINITIS   loratadine (CLARITIN) 10 mg, Daily   OVER THE COUNTER MEDICATION  1 tablet, Oral, Daily, Prostate Plus Health Complex w/ Cran-Max-Cranberry 1 daily    rosuvastatin (CRESTOR) 10 mg, Oral, Daily   Vitamin D3 2,000 Units, Oral, Daily       Objective:   Physical Exam BP 126/80   Pulse (!) 56   Temp 98 F (36.7 C) (Oral)   Resp 16   Ht '5\' 7"'$  (1.702 m)   Wt 199 lb 8 oz (90.5 kg)   SpO2 97%   BMI 31.25 kg/m  General:   Well developed, NAD, BMI noted. HEENT:  Normocephalic . Face symmetric, atraumatic.  TMs normal.  Nose minimal congestion, throat: Symmetric Lungs:  CTA B Normal respiratory effort, no intercostal retractions, no accessory muscle use. Heart: RRR,  no murmur.  Lower extremities: no pretibial edema bilaterally  Skin: Not pale. Not jaundice Neurologic:  alert & oriented X3.  Speech normal, gait appropriate for age and unassisted Psych--  Cognition and judgment appear intact.  Cooperative with normal attention span and concentration.  Behavior appropriate. No anxious or depressed appearing.      Assessment     Assessment: DM (A1c 6.5 2020) HTN Bigeminy: Saw cardiology 2019, Holter: 80% QRS with PVCs.  Echo was done, see report BPH, recurrent UTIs, chronic cystitis OSA, on CPAP Umbilical  hernia Actinic keratosis; no derm eval recently as off 02/2018 Tinnitus  H/o vitamin D deficiency H/o R leg  DVT, 2005 and 2020, unprovoked, hematology Rx lifelong anticoagulant   PLAN: Viral syndrome: The patient had a viral illness, apparently not severe, and is feeling better. Plan: Conservative treatment, see AVS, due for a CPX, schedule at his convenience

## 2022-11-25 ENCOUNTER — Ambulatory Visit (INDEPENDENT_AMBULATORY_CARE_PROVIDER_SITE_OTHER): Payer: Medicare Other | Admitting: Internal Medicine

## 2022-11-25 ENCOUNTER — Encounter: Payer: Self-pay | Admitting: Internal Medicine

## 2022-11-25 VITALS — BP 136/80 | HR 54 | Temp 97.6°F | Resp 16 | Ht 67.0 in | Wt 193.4 lb

## 2022-11-25 DIAGNOSIS — I10 Essential (primary) hypertension: Secondary | ICD-10-CM

## 2022-11-25 DIAGNOSIS — E781 Pure hyperglyceridemia: Secondary | ICD-10-CM

## 2022-11-25 DIAGNOSIS — E119 Type 2 diabetes mellitus without complications: Secondary | ICD-10-CM

## 2022-11-25 DIAGNOSIS — Z0000 Encounter for general adult medical examination without abnormal findings: Secondary | ICD-10-CM

## 2022-11-25 DIAGNOSIS — Z23 Encounter for immunization: Secondary | ICD-10-CM | POA: Diagnosis not present

## 2022-11-25 NOTE — Patient Instructions (Addendum)
Vaccines I recommend:  Shingrix (shingles) RSV  Check the  blood pressure regularly BP GOAL is between 110/65 and  135/85. If it is consistently higher or lower, let me know   GO TO THE LAB : Get the blood work     [place], [place] back for   checkup in 6 months     Please bring [place] a copy of your Healthcare Power of [place].  Per our records you are due for your diabetic eye exam. Please contact your eye doctor to schedule an appointment. Please have them send copies of your office visit notes to [place]. Our fax number is [phone]. If you need a referral to an eye doctor please let us know.

## 2022-11-25 NOTE — Progress Notes (Unsigned)
Subjective:    Patient ID: Timothy Stone., male    DOB: 02-22-1941, 82 y.o.   MRN: [id number]  DOS:  11/25/2022 Type of visit - description: CPX Here for CPX Few days ago was seen with a viral syndrome, he feels fully recuperated. Denies any GI or GU symptoms specifically no blood in the urine or problems with incontinence.   Review of Systems See above   Past Medical History:  Diagnosis Date   Actinic keratosis    hyperplastic aktinic keratosis forehead s/p surgery Dr Link Snuffer   BPH (benign prostatic hyperplasia)    s/p TUNA   Chronic cystitis    Dr. Lovena Neighbours   Diabetes mellitus without complication [facility])    DVT (deep venous thrombosis) ([place]) 2005   no know triggers per pt   HTN (hypertension)    dx in the 70s   OSA (obstructive sleep apnea)    on CPAP   Recurrent UTI    Umbilical hernia    no surgery   Vitamin D deficiency     Past Surgical History:  Procedure Laterality Date   PROSTATE SURGERY  2003   TUNA around 2003    Current Outpatient Medications  Medication Instructions   acetaminophen (TYLENOL) 1,000 mg, Oral, Daily PRN,     azelastine (ASTELIN) 0.1 % nasal spray 2 sprays, Each Nare, 2 times daily, Use in each nostril as directed   bisoprolol-hydrochlorothiazide (ZIAC) 5-6.25 MG tablet TAKE 1 TABLET BY MOUTH DAILY .  OKAY TO TAKE A SECOND TABLET IF  BLOOD PRESSURE IS MORE THAN  140/85   Chlorphen-Pseudoephed-APAP (CORICIDIN D PO) 1 tablet, Oral, Daily PRN   diclofenac Sodium (VOLTAREN) 2 g, Topical, 4 times daily   doxazosin (CARDURA) 4 mg, Oral, Daily at bedtime   ELIQUIS 2.5 MG TABS tablet TAKE 1 TABLET BY MOUTH TWICE  DAILY   Flaxseed Oil OIL 1,400 mg, Oral, Daily   fluticasone (FLONASE) 50 MCG/ACT nasal spray USE 2 SPRAYS IN BOTH NOSTRILS  DAILY AS NEEDED FOR ALLERGIES OR RHINITIS   loratadine (CLARITIN) 10 mg, Daily   OVER THE COUNTER MEDICATION 1 tablet, Oral, Daily, Prostate Plus Health Complex w/ Cran-Max-Cranberry 1 daily    rosuvastatin  (CRESTOR) 10 mg, Oral, Daily   Vitamin D3 2,000 Units, Oral, Daily       Objective:   Physical Exam BP 136/80   Pulse (!) 54   Temp 97.6 F (36.4 C) (Oral)   Resp 16   Ht 5\' 7"  (1.702 m)   Wt 193 lb 6 oz (87.7 kg)   SpO2 99%   BMI 30.29 kg/m  General: Well developed, NAD, BMI noted Neck: No  thyromegaly  HEENT:  Normocephalic . Face symmetric, atraumatic Lungs:  CTA B Normal respiratory effort, no intercostal retractions, no accessory muscle use. Heart: RRR,  no murmur.  Abdomen:  Not distended, soft, non-tender. No rebound or rigidity.   DM foot exam: No edema, good pedal pulses, pinprick examination normal Skin: Exposed areas without rash. Not pale. Not jaundice Neurologic:  alert & oriented X3.  Speech normal, gait appropriate for age and unassisted Strength symmetric and appropriate for age.  Psych: Cognition and judgment appear intact.  Cooperative with normal attention span and concentration.  Behavior appropriate. No anxious or depressed appearing.     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  Echo was done, see report BPH, recurrent UTIs, chronic cystitis OSA, on CPAP  Umbilical hernia Actinic keratosis; no derm eval recently as off 2024 Tinnitus  H/o vitamin D deficiency H/o R leg  DVT, 2005 and 2020, unprovoked, hematology Rx lifelong anticoagulant   PLAN: Here for CPX DM: Diet controlled, check labs including micro.  Foot exam negative. HTN: On Ziac, Cardura, BP looks very good, she checks at least once daily.  No change.  Labs. BPH: Reports no symptoms, sees urology per patient.  I do not have any recent notes. Actinic keratosis: Recommend to see dermatology regularly, states he will reach out to them. Lifelong anticoagulation: Seems to be tolerating well. OSA: has not being using a CPAP, benefits d/w pt, rec to restart RTC 6 months  -Td 2015 - pneumonia shot 02/2018;  prevnar  02-2014; PNM 20: today -  Zostavax:2011 - shingrex - RSV -COVID VAX UTD --CCS:  Cscope @ [facility] 11-08-2001 Dr Dorrene German: small internal Hemorrhoids. Cscope 05-12-2012, 1 polyp, tubular adenoma, Cscope again 03/30/17, no report.cscope 08-2022 , next per GI --DRE PSA: Sees urology --Labs:  CMP CBC A1c -- recommend healthy diet and stay active. -- Healthcare POA: See AVS

## 2022-11-26 ENCOUNTER — Encounter: Payer: Self-pay | Admitting: Internal Medicine

## 2022-11-26 LAB — COMPREHENSIVE METABOLIC PANEL
ALT: 12 U/L (ref 0–53)
AST: 16 U/L (ref 0–37)
Albumin: 4.5 g/dL (ref 3.5–5.2)
Alkaline Phosphatase: 51 U/L (ref 39–117)
BUN: 18 mg/dL (ref 6–23)
CO2: 30 mEq/L (ref 19–32)
Calcium: 9.4 mg/dL (ref 8.4–10.5)
Chloride: 101 mEq/L (ref 96–112)
Creatinine, Ser: 1.05 mg/dL (ref 0.40–1.50)
GFR: 66.12 mL/min (ref >60.00)
Glucose, Bld: 95 mg/dL (ref 70–99)
Potassium: 4.1 mEq/L (ref 3.5–5.1)
Sodium: 139 mEq/L (ref 135–145)
Total Bilirubin: 0.7 mg/dL (ref 0.2–1.2)
Total Protein: 7.3 g/dL (ref 6.0–8.3)

## 2022-11-26 LAB — CBC WITH DIFFERENTIAL/PLATELET
Basophils Absolute: 0 10*3/uL (ref 0.0–0.1)
Basophils Relative: 0.3 % (ref 0.0–3.0)
Eosinophils Absolute: 0 10*3/uL (ref 0.0–0.7)
Eosinophils Relative: 0.5 % (ref 0.0–5.0)
HCT: 40.7 % (ref 39.0–52.0)
Hemoglobin: 13.5 g/dL (ref 13.0–17.0)
Lymphocytes Relative: 40.8 % (ref 12.0–46.0)
Lymphs Abs: 2.4 10*3/uL (ref 0.7–4.0)
MCHC: 33.3 g/dL (ref 30.0–36.0)
MCV: 89.5 fl (ref 78.0–100.0)
Monocytes Absolute: 0.3 10*3/uL (ref 0.1–1.0)
Monocytes Relative: 5.8 % (ref 3.0–12.0)
Neutro Abs: 3.1 10*3/uL (ref 1.4–7.7)
Neutrophils Relative %: 52.6 % (ref 43.0–77.0)
Platelets: 211 10*3/uL (ref 150.0–400.0)
RBC: 4.55 Mil/uL (ref 4.22–5.81)
RDW: 14.5 % (ref 11.5–15.5)
WBC: 5.9 10*3/uL (ref 4.0–10.5)

## 2022-11-26 LAB — MICROALBUMIN / CREATININE URINE RATIO
Creatinine,U: 48.5 mg/dL
Microalb Creat Ratio: 1.4 mg/g (ref 0.0–30.0)
Microalb, Ur: <0.7 mg/dL (ref 0.0–1.9)

## 2022-11-26 LAB — HEMOGLOBIN A1C: Hgb A1c MFr Bld: 6.4 % (ref 4.6–6.5)

## 2022-11-26 NOTE — Assessment & Plan Note (Signed)
-  Td 2015 - pneumonia shot 02/2018;  prevnar 02-2014; PNM 20: today 11/2022 -  Zostavax:2011 - shingrex - RSV recommended -COVID VAX UTD --CCS:  Cscope @ [facility] 11-08-2001 Dr Dorrene German: small internal Hemorrhoids. Cscope 05-12-2012, 1 polyp, tubular adenoma, Cscope again 03/30/17, no report.cscope 08-2022 , next per GI --DRE PSA: Sees urology --Labs:  CMP CBC A1c -- recommend healthy diet and stay active. -- Healthcare POA: See AVS

## 2022-11-26 NOTE — Assessment & Plan Note (Signed)
Here for CPX DM: Diet controlled, check labs including micro.  Foot exam negative. HTN: On Ziac, Cardura, BP looks very good, she checks at least once daily.  No change.  Labs. BPH: Reports no symptoms, sees urology ( I do not have any recent notes). Actinic keratosis: Recommend to see dermatology regularly, states he will reach out to them. Lifelong anticoagulation: Seems to be tolerating anticoags  well. OSA: has not being using a CPAP, benefits d/w pt, rec to restart RTC 6 months

## 2023-01-27 ENCOUNTER — Other Ambulatory Visit: Payer: Self-pay | Admitting: Internal Medicine

## 2023-03-01 ENCOUNTER — Other Ambulatory Visit: Payer: Self-pay | Admitting: Internal Medicine

## 2023-04-07 ENCOUNTER — Ambulatory Visit (INDEPENDENT_AMBULATORY_CARE_PROVIDER_SITE_OTHER): Payer: Medicare Other | Admitting: Internal Medicine

## 2023-04-07 VITALS — BP 130/74 | HR 57 | Temp 98.3°F | Resp 16 | Ht 67.0 in | Wt 190.0 lb

## 2023-04-07 DIAGNOSIS — M25551 Pain in right hip: Secondary | ICD-10-CM | POA: Diagnosis not present

## 2023-04-07 DIAGNOSIS — M25552 Pain in left hip: Secondary | ICD-10-CM

## 2023-04-07 NOTE — Progress Notes (Signed)
Subjective:    Patient ID: Timothy Stone., male    DOB: 22-Sep-1940, 82 y.o.   MRN: [id number]  DOS:  04/07/2023 Type of visit - description: acute  The patient took a trip to [place], he did a lot of walking, developed bilateral hip pain.  He took some Tylenol with mild relief. Pain was enough that he had to slow down. No back pain, no knee pain. Ortho referral?   Review of Systems See above   Past Medical History:  Diagnosis Date   Actinic keratosis    hyperplastic aktinic keratosis forehead s/p surgery Dr Park Liter   BPH (benign prostatic hyperplasia)    s/p TUNA   Chronic cystitis    Dr. Liliane Shi   Diabetes mellitus without complication [facility])    DVT (deep venous thrombosis) ([facility]) 2005   no know triggers per pt   HTN (hypertension)    dx in the 70s   OSA (obstructive sleep apnea)    on CPAP   Recurrent UTI    Umbilical hernia    no surgery   Vitamin D deficiency     Past Surgical History:  Procedure Laterality Date   PROSTATE SURGERY  2003   TUNA around 2003    Current Outpatient Medications  Medication Instructions   acetaminophen (TYLENOL) 1,000 mg, Oral, Daily PRN,     azelastine (ASTELIN) 0.1 % nasal spray 2 sprays, Each Nare, 2 times daily, Use in each nostril as directed   bisoprolol-hydrochlorothiazide (ZIAC) 5-6.25 MG tablet TAKE 1 TABLET BY MOUTH DAILY  OKAY TO TAKE A SECOND TABLET IF  BLOOD PRESSURE IS MORE THAN  140/85   Chlorphen-Pseudoephed-APAP (CORICIDIN D PO) 1 tablet, Oral, Daily PRN   diclofenac Sodium (VOLTAREN) 2 g, Topical, 4 times daily   doxazosin (CARDURA) 4 mg, Oral, Daily at bedtime   ELIQUIS 2.5 MG TABS tablet TAKE 1 TABLET BY MOUTH TWICE  DAILY   Flaxseed Oil OIL 1,400 mg, Oral, Daily   fluticasone (FLONASE) 50 MCG/ACT nasal spray 2 sprays, Each Nare, Daily PRN   loratadine (CLARITIN) 10 mg, Daily   OVER THE COUNTER MEDICATION 1 tablet, Oral, Daily, Prostate Plus Health Complex w/ Cran-Max-Cranberry 1 daily    rosuvastatin  (CRESTOR) 10 mg, Oral, Daily   Vitamin D3 2,000 Units, Oral, Daily       Objective:   Physical Exam BP 130/74   Pulse (!) 57   Temp 98.3 F (36.8 C) (Oral)   Resp 16   Ht 5\' 7"  (1.702 m)   Wt 190 lb (86.2 kg)   SpO2 97%   BMI 29.76 kg/m  General:   Well developed, NAD, BMI noted. HEENT:  Normocephalic . Face symmetric, atraumatic Lower extremities: Hips are: Normal.  No TTP at the trochanteric bursa's Skin: Not pale. Not jaundice Neurologic:  alert & oriented X3.  Speech normal, gait appropriate for age and unassisted Psych--  Cognition and judgment appear intact.  Cooperative with normal attention span and concentration.  Behavior appropriate. No anxious or depressed appearing.      Assessment     Assessment: DM (A1c 6.5 2020) HTN Bigeminy: Saw cardiology 2019, Holter: 80% QRS with PVCs.  Echo was done, see report BPH, recurrent UTIs, chronic cystitis OSA, on CPAP Umbilical hernia Actinic keratosis; no derm eval recently as off 02/2018 Tinnitus  H/o vitamin D deficiency H/o R leg  DVT, 2005 and 2020, unprovoked, hematology Rx lifelong anticoagulant   PLAN: Hip pain, bilateral: Normally he exercises several times a  week, walks on the treadmill for 1-1/2-hour without problems.  Took a trip to [place] for 10 days, had long days and walk quite a bit and developed bilateral hip pain. Would like to see a orthopedic doctor to see if something can be done.  Will refer   to Dr. Christell Constant. From my side I could rx  PT in preparation for another trip, also icing at night and Voltaren gel as needed. He is coming back for CPX in October

## 2023-04-07 NOTE — Assessment & Plan Note (Signed)
Hip pain, bilateral: Normally he exercises several times a week, walks on the treadmill for 1-1/2-hour without problems.  Took a trip to [place] for 10 days, had long days and walk quite a bit and developed bilateral hip pain. Would like to see a orthopedic doctor to see if something can be done.  Will refer   to Dr. Christell Constant. From my side I could rx  PT in preparation for another trip, also icing at night and Voltaren gel as needed. He is coming back for CPX in October

## 2023-04-07 NOTE — Patient Instructions (Addendum)
Per our records you are due for your diabetic eye exam. Please contact your eye doctor to schedule an appointment. Please have them send copies of your office visit notes to us. Our fax number is [phone]. If you need a referral to an eye doctor please let us know.  

## 2023-04-21 ENCOUNTER — Other Ambulatory Visit: Payer: Self-pay | Admitting: Internal Medicine

## 2023-04-21 DIAGNOSIS — H52223 Regular astigmatism, bilateral: Secondary | ICD-10-CM | POA: Diagnosis not present

## 2023-04-21 DIAGNOSIS — H524 Presbyopia: Secondary | ICD-10-CM | POA: Diagnosis not present

## 2023-04-21 DIAGNOSIS — H53143 Visual discomfort, bilateral: Secondary | ICD-10-CM | POA: Diagnosis not present

## 2023-04-21 LAB — HM DIABETES EYE EXAM

## 2023-04-23 DIAGNOSIS — M7062 Trochanteric bursitis, left hip: Secondary | ICD-10-CM | POA: Diagnosis not present

## 2023-04-23 DIAGNOSIS — M7061 Trochanteric bursitis, right hip: Secondary | ICD-10-CM | POA: Diagnosis not present

## 2023-04-23 DIAGNOSIS — R52 Pain, unspecified: Secondary | ICD-10-CM | POA: Diagnosis not present

## 2023-04-26 DIAGNOSIS — R3914 Feeling of incomplete bladder emptying: Secondary | ICD-10-CM | POA: Diagnosis not present

## 2023-04-29 ENCOUNTER — Telehealth: Payer: Self-pay | Admitting: Internal Medicine

## 2023-04-29 ENCOUNTER — Encounter: Payer: Self-pay | Admitting: Internal Medicine

## 2023-04-29 NOTE — Telephone Encounter (Signed)
Request faxed to Dr. Rondel Baton office.

## 2023-04-29 NOTE — Telephone Encounter (Signed)
Pt states he was told to get a diabetic eye exam which he has already received. He states he was told to have our office contact [employer] and request the ov. Blima Ledger, fax# [phone].

## 2023-04-30 ENCOUNTER — Encounter: Payer: Self-pay | Admitting: Internal Medicine

## 2023-05-03 DIAGNOSIS — N3020 Other chronic cystitis without hematuria: Secondary | ICD-10-CM | POA: Diagnosis not present

## 2023-05-03 DIAGNOSIS — R3914 Feeling of incomplete bladder emptying: Secondary | ICD-10-CM | POA: Diagnosis not present

## 2023-05-03 DIAGNOSIS — N281 Cyst of kidney, acquired: Secondary | ICD-10-CM | POA: Diagnosis not present

## 2023-05-31 ENCOUNTER — Ambulatory Visit: Payer: Medicare Other | Admitting: Internal Medicine

## 2023-06-03 DIAGNOSIS — H903 Sensorineural hearing loss, bilateral: Secondary | ICD-10-CM | POA: Diagnosis not present

## 2023-06-03 DIAGNOSIS — H9313 Tinnitus, bilateral: Secondary | ICD-10-CM | POA: Diagnosis not present

## 2023-06-16 ENCOUNTER — Encounter: Payer: Self-pay | Admitting: Internal Medicine

## 2023-06-16 ENCOUNTER — Ambulatory Visit (INDEPENDENT_AMBULATORY_CARE_PROVIDER_SITE_OTHER): Payer: Medicare Other | Admitting: Internal Medicine

## 2023-06-16 VITALS — BP 132/68 | HR 54 | Temp 97.8°F | Resp 16 | Ht 67.0 in | Wt 184.2 lb

## 2023-06-16 DIAGNOSIS — I10 Essential (primary) hypertension: Secondary | ICD-10-CM

## 2023-06-16 DIAGNOSIS — E1169 Type 2 diabetes mellitus with other specified complication: Secondary | ICD-10-CM

## 2023-06-16 DIAGNOSIS — I498 Other specified cardiac arrhythmias: Secondary | ICD-10-CM | POA: Diagnosis not present

## 2023-06-16 DIAGNOSIS — E781 Pure hyperglyceridemia: Secondary | ICD-10-CM | POA: Diagnosis not present

## 2023-06-16 NOTE — Patient Instructions (Addendum)
Vaccines I recommend: RSV vaccine Shingrix (shingles)  Continue checking your blood pressure regularly Blood pressure goal:  between 110/65 and  135/85. If it is consistently higher or lower, let me know     GO TO THE LAB : Get the blood work     Next visit with me by 11-2023, physical exam. Please schedule it at the front desk

## 2023-06-16 NOTE — Assessment & Plan Note (Signed)
DM: Diet controlled, check A1c. High cholesterol: On Crestor, last LFTs normal, check FLP. Bigeminy: History of bigeminy, heart rate today approximately 45.  EKG showed bradycardia with PVCs, not unusual for him but he is having some dizziness and I wonder if there is a relationship. LOV with cardiology a year ago, refer him back to them.   Asked to call me if BPs are persistently low or if he has more dizziness.   HTN:   continue Ziac and Cardura.  Labs. Vaccine information updated. RTC CPX 11/2023.

## 2023-06-16 NOTE — Progress Notes (Signed)
Subjective:    Patient ID: Timothy Stone., male    DOB: 1940/11/21, 82 y.o.   MRN: [id number]  DOS:  06/16/2023 Type of visit - description: Routine checkup  In general feeling well. Chronic medical problems addressed. I noted him to be bradycardic. He reports that occasionally he gets slightly dizzy but denies chest pain, difficulty breathing or LOC. BP at times is low, this morning was 110/60.   Review of Systems See above   Past Medical History:  Diagnosis Date   Actinic keratosis    hyperplastic aktinic keratosis forehead s/p surgery Dr Park Liter   BPH (benign prostatic hyperplasia)    s/p TUNA   Chronic cystitis    Dr. Liliane Shi   Diabetes mellitus without complication [facility])    DVT (deep venous thrombosis) ([facility]) 2005   no know triggers per pt   HTN (hypertension)    dx in the 70s   OSA (obstructive sleep apnea)    on CPAP   Recurrent UTI    Umbilical hernia    no surgery   Vitamin D deficiency     Past Surgical History:  Procedure Laterality Date   PROSTATE SURGERY  2003   TUNA around 2003    Current Outpatient Medications  Medication Instructions   acetaminophen (TYLENOL) 1,000 mg, Oral, Daily PRN,     azelastine (ASTELIN) 0.1 % nasal spray 2 sprays, Each Nare, 2 times daily, Use in each nostril as directed   bisoprolol-hydrochlorothiazide (ZIAC) 5-6.25 MG tablet TAKE 1 TABLET BY MOUTH DAILY  OKAY TO TAKE A SECOND TABLET IF  BLOOD PRESSURE IS MORE THAN  140/85   Chlorphen-Pseudoephed-APAP (CORICIDIN D PO) 1 tablet, Oral, Daily PRN   diclofenac Sodium (VOLTAREN) 2 g, Topical, 4 times daily   doxazosin (CARDURA) 4 mg, Oral, Daily at bedtime   ELIQUIS 2.5 MG TABS tablet TAKE 1 TABLET BY MOUTH TWICE  DAILY   Flaxseed Oil OIL 1,400 mg, Oral, Daily   fluticasone (FLONASE) 50 MCG/ACT nasal spray 2 sprays, Each Nare, Daily PRN   loratadine (CLARITIN) 10 mg, Daily   OVER THE COUNTER MEDICATION 1 tablet, Oral, Daily, Prostate Plus Health Complex w/  Cran-Max-Cranberry 1 daily    rosuvastatin (CRESTOR) 10 mg, Oral, Daily   Vitamin D3 2,000 Units, Oral, Daily       Objective:   Physical Exam BP 132/68   Pulse (!) 54   Temp 97.8 F (36.6 C) (Oral)   Resp 16   Ht 5\' 7"  (1.702 m)   Wt 184 lb 4 oz (83.6 kg)   SpO2 97%   BMI 28.86 kg/m  General:   Well developed, NAD, BMI noted. HEENT:  Normocephalic . Face symmetric, atraumatic Lungs:  CTA B Normal respiratory effort, no intercostal retractions, no accessory muscle use. Heart: Bradycardic, occasional skipped beat, heart rate approximately 45.  Lower extremities: no pretibial edema bilaterally  Skin: Not pale. Not jaundice Neurologic:  alert & oriented X3.  Speech normal, gait appropriate for age and unassisted Psych--  Cognition and judgment appear intact.  Cooperative with normal attention span and concentration.  Behavior appropriate. No anxious or depressed appearing.      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  See full report. --Echo was done, see report BPH, recurrent UTIs, chronic cystitis OSA, on CPAP Umbilical hernia Actinic keratosis;  no derm eval recently as off 02/2018 Tinnitus  H/o vitamin D deficiency H/o R leg  DVT, 2005 and 2020, unprovoked, hematology Rx lifelong anticoagulant   PLAN: DM: Diet controlled, check A1c. High cholesterol: On Crestor, last LFTs normal, check FLP. Bigeminy: History of bigeminy, heart rate today approximately 45.  EKG showed bradycardia with PVCs, not unusual for him but he is having some dizziness and I wonder if there is a relationship. LOV with cardiology a year ago, refer him back to them.   Asked to call me if BPs are persistently low or if he has more dizziness.   HTN:   continue Ziac and Cardura.  Labs. Vaccine  information updated. RTC CPX 11/2023.

## 2023-06-17 LAB — BASIC METABOLIC PANEL
BUN: 24 mg/dL — ABNORMAL HIGH (ref 6–23)
CO2: 29 meq/L (ref 19–32)
Calcium: 9.3 mg/dL (ref 8.4–10.5)
Chloride: 100 meq/L (ref 96–112)
Creatinine, Ser: 1 mg/dL (ref 0.40–1.50)
GFR: 69.83 mL/min (ref >60.00)
Glucose, Bld: 89 mg/dL (ref 70–99)
Potassium: 3.8 meq/L (ref 3.5–5.1)
Sodium: 137 meq/L (ref 135–145)

## 2023-06-17 LAB — LIPID PANEL
Cholesterol: 87 mg/dL (ref 0–200)
HDL: 33.8 mg/dL — ABNORMAL LOW (ref >39.00)
LDL Cholesterol: 36 mg/dL (ref 0–99)
NonHDL: 53.01
Total CHOL/HDL Ratio: 3
Triglycerides: 86 mg/dL (ref 0.0–149.0)
VLDL: 17.2 mg/dL (ref 0.0–40.0)

## 2023-06-17 LAB — HEMOGLOBIN A1C: Hgb A1c MFr Bld: 6 % (ref 4.6–6.5)

## 2023-06-21 DIAGNOSIS — G4733 Obstructive sleep apnea (adult) (pediatric): Secondary | ICD-10-CM | POA: Diagnosis not present

## 2023-06-22 DIAGNOSIS — G4733 Obstructive sleep apnea (adult) (pediatric): Secondary | ICD-10-CM | POA: Diagnosis not present

## 2023-07-08 ENCOUNTER — Ambulatory Visit: Payer: Medicare Other

## 2023-07-08 ENCOUNTER — Ambulatory Visit: Payer: Medicare Other | Admitting: *Deleted

## 2023-07-08 VITALS — BP 119/80 | HR 71 | Ht 67.0 in | Wt 187.8 lb

## 2023-07-08 DIAGNOSIS — Z0000 Encounter for general adult medical examination without abnormal findings: Secondary | ICD-10-CM | POA: Diagnosis not present

## 2023-07-08 NOTE — Patient Instructions (Signed)
Timothy Stone , Thank you for taking time to come for your Medicare Wellness Visit. I appreciate your ongoing commitment to your health goals. Please review the following plan we discussed and let me know if I can assist you in the future.     This is a list of the screening recommended for you and due dates:  Health Maintenance  Topic Date Due   COVID-19 Vaccine (8 - 2023-24 season) 10/09/2023   Yearly kidney health urinalysis for diabetes  11/25/2023   Complete foot exam   11/25/2023   Hemoglobin A1C  12/15/2023   DTaP/Tdap/Td vaccine (3 - Tdap) 02/17/2024   Eye exam for diabetics  04/20/2024   Yearly kidney function blood test for diabetes  06/15/2024   Medicare Annual Wellness Visit  07/07/2024   Colon Cancer Screening  08/25/2025   Pneumonia Vaccine  Completed   Flu Shot  Completed   Zoster (Shingles) Vaccine  Completed   HPV Vaccine  Aged Out    Next appointment: Follow up in one year for your annual wellness visit.  Preventive Care 71 Years and Older, Male Preventive care refers to lifestyle choices and visits with your health care provider that can promote health and wellness. What does preventive care include? A yearly physical exam. This is also called an annual well check. Dental exams once or twice a year. Routine eye exams. Ask your health care provider how often you should have your eyes checked. Personal lifestyle choices, including: Daily care of your teeth and gums. Regular physical activity. Eating a healthy diet. Avoiding tobacco and drug use. Limiting alcohol use. Practicing safe sex. Taking low doses of aspirin every day. Taking vitamin and mineral supplements as recommended by your health care provider. What happens during an annual well check? The services and screenings done by your health care provider during your annual well check will depend on your age, overall health, lifestyle risk factors, and family history of disease. Counseling  Your health  care provider may ask you questions about your: Alcohol use. Tobacco use. Drug use. Emotional well-being. Home and relationship well-being. Sexual activity. Eating habits. History of falls. Memory and ability to understand (cognition). Work and work Astronomer. Screening  You may have the following tests or measurements: Height, weight, and BMI. Blood pressure. Lipid and cholesterol levels. These may be checked every 5 years, or more frequently if you are over 60 years old. Skin check. Lung cancer screening. You may have this screening every year starting at age 68 if you have a 30-pack-year history of smoking and currently smoke or have quit within the past 15 years. Fecal occult blood test (FOBT) of the stool. You may have this test every year starting at age 36. Flexible sigmoidoscopy or colonoscopy. You may have a sigmoidoscopy every 5 years or a colonoscopy every 10 years starting at age 47. Prostate cancer screening. Recommendations will vary depending on your family history and other risks. Hepatitis C blood test. Hepatitis B blood test. Sexually transmitted disease (STD) testing. Diabetes screening. This is done by checking your blood sugar (glucose) after you have not eaten for a while (fasting). You may have this done every 1-3 years. Abdominal aortic aneurysm (AAA) screening. You may need this if you are a current or former smoker. Osteoporosis. You may be screened starting at age 51 if you are at high risk. Talk with your health care provider about your test results, treatment options, and if necessary, the need for more tests. Vaccines  Your  health care provider may recommend certain vaccines, such as: Influenza vaccine. This is recommended every year. Tetanus, diphtheria, and acellular pertussis (Tdap, Td) vaccine. You may need a Td booster every 10 years. Zoster vaccine. You may need this after age 74. Pneumococcal 13-valent conjugate (PCV13) vaccine. One dose is  recommended after age 75. Pneumococcal polysaccharide (PPSV23) vaccine. One dose is recommended after age 73. Talk to your health care provider about which screenings and vaccines you need and how often you need them. This information is not intended to replace advice given to you by your health care provider. Make sure you discuss any questions you have with your health care provider. Document Released: 09/20/2015 Document Revised: 05/13/2016 Document Reviewed: 06/25/2015 Elsevier Interactive Patient Education  2017 [employer].  Fall Prevention in the Home Falls can cause injuries. They can happen to people of all ages. There are many things you can do to make your home safe and to help prevent falls. What can I do on the outside of my home? Regularly fix the edges of walkways and driveways and fix any cracks. Remove anything that might make you trip as you walk through a door, such as a raised step or threshold. Trim any bushes or trees on the path to your home. Use bright outdoor lighting. Clear any walking paths of anything that might make someone trip, such as rocks or tools. Regularly check to see if handrails are loose or broken. Make sure that both sides of any steps have handrails. Any raised decks and porches should have guardrails on the edges. Have any leaves, snow, or ice cleared regularly. Use sand or salt on walking paths during winter. Clean up any spills in your garage right away. This includes oil or grease spills. What can I do in the bathroom? Use night lights. Install grab bars by the toilet and in the tub and shower. Do not use towel bars as grab bars. Use non-skid mats or decals in the tub or shower. If you need to sit down in the shower, use a plastic, non-slip stool. Keep the floor dry. Clean up any water that spills on the floor as soon as it happens. Remove soap buildup in the tub or shower regularly. Attach bath mats securely with double-sided non-slip rug  tape. Do not have throw rugs and other things on the floor that can make you trip. What can I do in the bedroom? Use night lights. Make sure that you have a light by your bed that is easy to reach. Do not use any sheets or blankets that are too big for your bed. They should not hang down onto the floor. Have a firm chair that has side arms. You can use this for support while you get dressed. Do not have throw rugs and other things on the floor that can make you trip. What can I do in the kitchen? Clean up any spills right away. Avoid walking on wet floors. Keep items that you use a lot in easy-to-reach places. If you need to reach something above you, use a strong step stool that has a grab bar. Keep electrical cords out of the way. Do not use floor polish or wax that makes floors slippery. If you must use wax, use non-skid floor wax. Do not have throw rugs and other things on the floor that can make you trip. What can I do with my stairs? Do not leave any items on the stairs. Make sure that there are handrails  on both sides of the stairs and use them. Fix handrails that are broken or loose. Make sure that handrails are as long as the stairways. Check any carpeting to make sure that it is firmly attached to the stairs. Fix any carpet that is loose or worn. Avoid having throw rugs at the top or bottom of the stairs. If you do have throw rugs, attach them to the floor with carpet tape. Make sure that you have a light switch at the top of the stairs and the bottom of the stairs. If you do not have them, ask someone to add them for you. What else can I do to help prevent falls? Wear shoes that: Do not have high heels. Have rubber bottoms. Are comfortable and fit you well. Are closed at the toe. Do not wear sandals. If you use a stepladder: Make sure that it is fully opened. Do not climb a closed stepladder. Make sure that both sides of the stepladder are locked into place. Ask someone to  hold it for you, if possible. Clearly mark and make sure that you can see: Any grab bars or handrails. First and last steps. Where the edge of each step is. Use tools that help you move around (mobility aids) if they are needed. These include: Canes. Walkers. Scooters. Crutches. Turn on the lights when you go into a dark area. Replace any light bulbs as soon as they burn out. Set up your furniture so you have a clear path. Avoid moving your furniture around. If any of your floors are uneven, fix them. If there are any pets around you, be aware of where they are. Review your medicines with your doctor. Some medicines can make you feel dizzy. This can increase your chance of falling. Ask your doctor what other things that you can do to help prevent falls. This information is not intended to replace advice given to you by your health care provider. Make sure you discuss any questions you have with your health care provider. Document Released: 06/20/2009 Document Revised: 01/30/2016 Document Reviewed: 09/28/2014 Elsevier Interactive Patient Education  2017 [employer].

## 2023-07-08 NOTE — Progress Notes (Signed)
Subjective:   Timothy Stone. is a 82 y.o. male who presents for Medicare Annual/Subsequent preventive examination.  Visit Complete: In person  Patient Medicare AWV questionnaire was completed by the patient on 07/07/23; I have confirmed that all information answered by patient is correct and no changes since this date.  Cardiac Risk Factors include: advanced age (>80men, >32 women);male gender;diabetes mellitus;dyslipidemia;hypertension     Objective:    Today's Vitals   07/08/23 1102  BP: 119/80  Pulse: 71  Weight: 187 lb 12.8 oz (85.2 kg)  Height: 5\' 7"  (1.702 m)   Body mass index is 29.41 kg/m.     07/08/2023   11:07 AM 07/06/2022    3:02 PM 07/02/2021    3:55 PM 07/22/2020    8:29 AM 01/19/2020   10:03 AM 08/22/2019   12:24 PM 08/11/2019    4:53 PM  Advanced Directives  Does Patient Have a Medical Advance Directive? Yes Yes Yes No No No No  Type of Estate agent of [place];Living will Living will Healthcare Power of [place];Living will      Does patient want to make changes to medical advance directive? No - Patient declined No - Patient declined       Copy of Healthcare Power of Attorney in Chart? No - copy requested  No - copy requested      Would patient like information on creating a medical advance directive?     No - Patient declined No - Patient declined No - Patient declined    Current Medications (verified) Outpatient Encounter Medications as of 07/08/2023  Medication Sig   acetaminophen (TYLENOL) 500 MG tablet Take 1,000 mg by mouth daily as needed for mild pain or headache.   azelastine (ASTELIN) 0.1 % nasal spray Place 2 sprays into both nostrils 2 (two) times daily. Use in each nostril as directed   bisoprolol-hydrochlorothiazide (ZIAC) 5-6.25 MG tablet TAKE 1 TABLET BY MOUTH DAILY  OKAY TO TAKE A SECOND TABLET IF  BLOOD PRESSURE IS MORE THAN  140/85   Cholecalciferol (VITAMIN D3) 2000 units TABS Take 2,000 Units by mouth  daily.    diclofenac Sodium (VOLTAREN) 1 % GEL Apply 2 g topically 4 (four) times daily.   doxazosin (CARDURA) 4 MG tablet Take 1 tablet (4 mg total) by mouth at bedtime.   ELIQUIS 2.5 MG TABS tablet TAKE 1 TABLET BY MOUTH TWICE  DAILY   Flaxseed Oil OIL Take 1,400 mg by mouth daily.    fluticasone (FLONASE) 50 MCG/ACT nasal spray Place 2 sprays into both nostrils daily as needed for allergies or rhinitis.   loratadine (CLARITIN) 10 MG tablet Take 10 mg by mouth daily. (Patient not taking: Reported on 11/09/2022)   OVER THE COUNTER MEDICATION Take 1 tablet by mouth daily. Prostate Plus Health Complex w/ Cran-Max-Cranberry 1 daily   rosuvastatin (CRESTOR) 10 MG tablet TAKE 1 TABLET BY MOUTH DAILY   No facility-administered encounter medications on file as of 07/08/2023.    Allergies (verified) Hydrocodone, Oxycodone, and Latex   History: Past Medical History:  Diagnosis Date   Actinic keratosis    hyperplastic aktinic keratosis forehead s/p surgery Dr Park Liter   Allergy    Arthritis    BPH (benign prostatic hyperplasia)    s/p TUNA   Cataract    Chronic cystitis    Dr. Liliane Shi   Diabetes mellitus without complication (HCC)    DVT (deep venous thrombosis) (HCC) 2005   no know triggers per pt  Heart murmur    HTN (hypertension)    dx in the 70s   OSA (obstructive sleep apnea)    on CPAP   Recurrent UTI    Sleep apnea    Umbilical hernia    no surgery   Vitamin D deficiency    Past Surgical History:  Procedure Laterality Date   PROSTATE SURGERY  2003   TUNA around 2003   Family History  Problem Relation Age of Onset   Diabetes Mother        M, sister   Heart attack Father 1   Hearing loss Father    Breast cancer Sister    Cancer Sister    Rectal cancer Sister 52       @ time of her first Cscope   Lung cancer Brother 76       smoker    Cancer Sister    Prostate cancer Neg Hx    Social History   Socioeconomic History   Marital status: Married    Spouse  name: Not on file   Number of children: 4   Years of education: Not on file   Highest education level: Associate degree: academic program  Occupational History   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Music therapist, Art gallery manager, 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 Less than 1 per week.   Drug use: Never   Sexual activity: Yes  Other Topics Concern   Not on file  Social History Narrative   Lives w/ wife   Lost a son 04/2021         Social Determinants of Health   Financial Resource Strain: Low Risk  (07/07/2023)   Overall Financial Resource Strain (CARDIA)    Difficulty of Paying Living Expenses: Not hard at all  Food Insecurity: No Food Insecurity (07/07/2023)   Hunger Vital Sign    Worried About Running Out of Food in the Last Year: Never true    Ran Out of Food in the Last Year: Never true  Transportation Needs: No Transportation Needs (07/07/2023)   PRAPARE - Administrator, Civil Service (Medical): No    Lack of Transportation (Non-Medical): No  Physical Activity: Sufficiently Active (07/07/2023)   Exercise Vital Sign    Days of Exercise per Week: 6 days    Minutes of Exercise per Session: 80 min  Stress: No Stress Concern Present (07/07/2023)   [employer] of Occupational Health - Occupational Stress Questionnaire    Feeling of Stress : Not at all  Social Connections: Socially Integrated (07/07/2023)   Social Connection and Isolation Panel [NHANES]    Frequency of Communication with Friends and Family: Twice a week    Frequency of Social Gatherings with Friends and Family: More than three times a week    Attends Religious Services: More than 4 times per year    Active Member of [employer] or Organizations: Yes    Attends Engineer, structural: More than 4 times per year     Marital Status: Married    Tobacco Counseling Counseling given: Not Answered   Clinical Intake:  Pre-visit preparation completed: Yes  Pain : No/denies pain  BMI - recorded: 29.41 Nutritional Status: BMI 25 -29 Overweight Nutritional Risks: None Diabetes: Yes CBG done?: No Did pt. bring in CBG monitor  from home?: No  How often do you need to have someone help you when you read instructions, pamphlets, or other written materials from your doctor or pharmacy?: 1 - Never  Interpreter Needed?: No  Information entered by :: Timothy Stone, CMA   Activities of Daily Living    07/07/2023   11:16 PM  In your present state of health, do you have any difficulty performing the following activities:  Hearing? 0  Vision? 0  Difficulty concentrating or making decisions? 0  Walking or climbing stairs? 0  Dressing or bathing? 0  Doing errands, shopping? 0  Preparing Food and eating ? N  Using the Toilet? N  In the past six months, have you accidently leaked urine? N  Do you have problems with loss of bowel control? N  Managing your Medications? N  Managing your Finances? N  Housekeeping or managing your Housekeeping? N    Patient Care Team: Wanda Plump, MD as PCP - General Katrinka Blazing Barry Dienes, MD (Inactive) as PCP - Cardiology (Cardiology) Lanier Prude, MD as PCP - Electrophysiology (Cardiology) Rene Paci, MD as Consulting Physician (Urology) Axel Filler, MD as Consulting Physician (General Surgery) Blima Ledger, OD as Consulting Physician (Optometry) Christell Constant Rowe Robert., MD as Consulting Physician (Otolaryngology) Henrene Pastor, RPH-CPP (Pharmacist)  Indicate any recent Medical Services you may have received from other than [facility] providers in the past year (date may be approximate).     Assessment:   This is a routine wellness examination for [facility].  Hearing/Vision screen No results found.   Goals Addressed   None    Depression Screen     07/08/2023   11:08 AM 06/16/2023    1:01 PM 04/07/2023    2:42 PM 11/25/2022    2:46 PM 11/09/2022    1:00 PM 07/06/2022    3:03 PM 06/22/2022    1:01 PM  PHQ 2/9 Scores  PHQ - 2 Score 0 0 0 0 0 0 0    Fall Risk    07/07/2023   11:16 PM 06/16/2023    1:01 PM 04/07/2023    2:42 PM 11/25/2022    2:46 PM 11/09/2022    1:00 PM  Fall Risk   Falls in the past year? 0 0 0 0 0  Number falls in past yr: 0 0 0 0 0  Injury with Fall? 0 0 0 0 0  Risk for fall due to : No Fall Risks      Follow up Falls evaluation completed Falls evaluation completed Falls evaluation completed Falls evaluation completed Falls evaluation completed    MEDICARE RISK AT HOME: Medicare Risk at Home Any stairs in or around the home?: Yes If so, are there any without handrails?: No Home free of loose throw rugs in walkways, pet beds, electrical cords, etc?: Yes Adequate lighting in your home to reduce risk of falls?: Yes Life alert?: No Use of a cane, walker or w/c?: No Grab bars in the bathroom?: No Shower chair or bench in shower?: Yes Elevated toilet seat or a handicapped toilet?: No  TIMED UP AND GO:  Was the test performed?  Yes  Length of time to ambulate 10 feet: 6 sec Gait steady and fast without use of assistive device    Cognitive Function:    04/26/2017    9:17 AM 04/06/2016    1:31 PM  MMSE - Mini Mental State Exam  Orientation to time 5 5  Orientation to Place 5 5  Registration  3 3  Attention/ Calculation 5 5  Recall 3 3  Language- name 2 objects 2 2  Language- repeat 1 1  Language- follow 3 step command 3 3  Language- read & follow direction 1 1  Write a sentence 1 1  Copy design 1 1  Total score 30 30        07/08/2023   11:10 AM 07/06/2022    3:09 PM 07/02/2021    4:09 PM  6CIT Screen  What Year? 0 points 0 points 0 points  What month? 0 points 0 points 0 points  What time? 0 points 0 points 0 points  Count back from 20 0 points 0 points 0 points  Months in reverse 0 points  0 points 0 points  Repeat phrase 0 points 2 points 0 points  Total Score 0 points 2 points 0 points    Immunizations Immunization History  Administered Date(s) Administered   Fluad Quad(high Dose 65+) 05/24/2019, 06/12/2020, 07/02/2021   Influenza Split 06/07/2014, 06/12/2022, 06/08/2023   Influenza Whole 07/28/2007, 05/26/2010   Influenza, High Dose Seasonal PF 07/02/2015, 05/27/2016, 06/17/2017, 06/16/2018   PFIZER(Purple Top)SARS-COV-2 Vaccination 09/22/2019, 10/13/2019, 07/12/2020, 01/16/2021   PNEUMOCOCCAL CONJUGATE-20 11/25/2022   Pfizer Covid-19 Vaccine Bivalent Booster 42yrs & up 07/02/2021, 06/12/2022   Pfizer(Comirnaty)Fall Seasonal Vaccine 12 years and older 06/08/2023   Pneumococcal Conjugate-13 02/16/2014   Pneumococcal Polysaccharide-23 05/18/2008, 03/02/2018   RSV,unspecified 12/14/2022   Td 09/08/2003   Tetanus 02/16/2014   Zoster Recombinant(Shingrix) 12/14/2022, 02/16/2023   Zoster, Live 05/26/2010    TDAP status: Up to date  Flu Vaccine status: Up to date  Pneumococcal vaccine status: Up to date  Covid-19 vaccine status: Completed vaccines  Qualifies for Shingles Vaccine? Yes   Zostavax completed Yes   Shingrix Completed?: Yes  Screening Tests Health Maintenance  Topic Date Due   Medicare Annual Wellness (AWV)  07/07/2023   COVID-19 Vaccine (8 - 2023-24 season) 10/09/2023   Diabetic kidney evaluation - Urine ACR  11/25/2023   FOOT EXAM  11/25/2023   HEMOGLOBIN A1C  12/15/2023   DTaP/Tdap/Td (3 - Tdap) 02/17/2024   OPHTHALMOLOGY EXAM  04/20/2024   Diabetic kidney evaluation - eGFR measurement  06/15/2024   Colonoscopy  08/25/2025   Pneumonia Vaccine 59+ Years old  Completed   INFLUENZA VACCINE  Completed   Zoster Vaccines- Shingrix  Completed   HPV VACCINES  Aged Out    Health Maintenance  Health Maintenance Due  Topic Date Due   Medicare Annual Wellness (AWV)  07/07/2023    Colorectal cancer screening: No longer required.   Lung  Cancer Screening: (Low Dose CT Chest recommended if Age 27-80 years, 20 pack-year currently smoking OR have quit w/in 15years.) does not qualify.   Additional Screening:  Hepatitis C Screening: does not qualify  Vision Screening: Recommended annual ophthalmology exams for early detection of glaucoma and other disorders of the eye. Is the patient up to date with their annual eye exam?  Yes  Who is the provider or what is the name of the office in which the patient attends annual eye exams? Dr. Hyacinth Meeker If pt is not established with a provider, would they like to be referred to a provider to establish care? No .   Dental Screening: Recommended annual dental exams for proper oral hygiene  Diabetic Foot Exam: Diabetic Foot Exam: Completed 11/25/22  Community Resource Referral / Chronic Care Management: CRR required this visit?  No   CCM required this visit?  No  Plan:     I have personally reviewed and noted the following in the patient's chart:   Medical and social history Use of alcohol, tobacco or illicit drugs  Current medications and supplements including opioid prescriptions. Patient is not currently taking opioid prescriptions. Functional ability and status Nutritional status Physical activity Advanced directives List of other physicians Hospitalizations, surgeries, and ER visits in previous 12 months Vitals Screenings to include cognitive, depression, and falls Referrals and appointments  In addition, I have reviewed and discussed with patient certain preventive protocols, quality metrics, and best practice recommendations. A written personalized care plan for preventive services as well as general preventive health recommendations were provided to patient.     Timothy Stone, CMA   07/08/2023   After Visit Summary: sent to mychart  Nurse Notes: None

## 2023-07-13 ENCOUNTER — Encounter: Payer: Self-pay | Admitting: Internal Medicine

## 2023-07-18 NOTE — Progress Notes (Unsigned)
Cardiology Office Note:  .   Date:  07/18/2023  ID:  Timothy Stone., DOB 05-20-1941, MRN [id number] PCP: Wanda Plump, MD  [facility] Providers Cardiologist:  Lesleigh Noe, MD (Inactive) Electrophysiologist:  Lanier Prude, MD {  History of Present Illness: Timothy Stone. is a 82 y.o. male w/PMHx of PVCs, HTN, chronic CHF (combined)  He saw Dr. Lalla Brothers 01/27/22, on/off symptoms of his PVCs, reported as infrequent, by symptoms, BP monitor and Kardia observations.  Some morning low BPs Going to the gym Bisoprolol continued Advised continued monitoring of his BP and f/u with his PMD Class I HF symptoms, last EF 45% Recommended 2 year follow up, sooner if needed   Today's visit is scheduled for "bigeminy"   ROS: ***  *** low HRs, dizzy described at his PMD visit *** remonitor?  PVCs? Bigeminy on his ekg *** some low BPs by log, mostly ok by notes *** amio  Arrhythmia/AAD hx PVCs noted 2021 None to date  Studies Reviewed: Marland Kitchen    EKG done today and reviewed by myself:  ***   Aug 2021 monitor Sinus rhythm and sinus bradycardia was the underlying rhythm. Heart rate ranged between 47 and 142 bpm. Frequent premature ventricular ectopic beats occurred, with a burden of 35%. Rare ventricular bigeminy and trigeminy was noted. A 4 beat run of ventricular tachycardia occurred. PACs were rare. First-degree AV block was noted.   Frequent premature ventricular contractions with burden approximately 35%.  Could be at risk for cardiomyopathy.   04/11/20: TTE 1. Left ventricular ejection fraction, by estimation, is 40 to 45% with  beat to beat variability due to ectopy. The left ventricle has mildly  decreased function. The left ventricle demonstrates global hypokinesis.  Left ventricular diastolic parameters  are consistent with Grade II diastolic dysfunction (pseudonormalization).  Elevated left ventricular end-diastolic pressure.   2. Right ventricular  systolic function is normal. The right ventricular  size is mildly enlarged. Mildly increased right ventricular wall  thickness. There is normal pulmonary artery systolic pressure.   3. The mitral valve is grossly normal. Mild mitral valve regurgitation.  No evidence of mitral stenosis.   4. The aortic valve is tricuspid. Aortic valve regurgitation is not  visualized. No aortic stenosis is present.   5. Aortic dilatation noted. 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   6. The inferior vena cava is normal in size with greater than 50%  respiratory variability, suggesting right atrial pressure of 3 mmHg.   7. Left atrial size was mildly dilated.   Comparison(s): A prior study was performed on 03/07/2018. Prior images  reviewed side by side. LVEF has decreased.    Risk Assessment/Calculations:    Physical Exam:   VS:  There were no vitals taken for this visit.   Wt Readings from Last 3 Encounters:  07/08/23 187 lb 12.8 oz (85.2 kg)  06/16/23 184 lb 4 oz (83.6 kg)  04/07/23 190 lb (86.2 kg)    GEN: Well nourished, well developed in no acute distress NECK: No JVD; No carotid bruits CARDIAC: ***RRR, no murmurs, rubs, gallops RESPIRATORY:  *** CTA b/l without rales, wheezing or rhonchi  ABDOMEN: Soft, non-tender, non-distended EXTREMITIES:  *** No edema; No deformity     ASSESSMENT AND PLAN: .    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 stress test, cath, DCCV, TEE, etc)?   Press F2        :[id number]}     Dispo: ***  Signed, Sheilah Pigeon, PA-C

## 2023-07-20 ENCOUNTER — Encounter: Payer: Self-pay | Admitting: Physician Assistant

## 2023-07-20 ENCOUNTER — Ambulatory Visit: Payer: Medicare Other | Attending: Physician Assistant | Admitting: Physician Assistant

## 2023-07-20 ENCOUNTER — Ambulatory Visit: Payer: Medicare Other | Attending: Physician Assistant

## 2023-07-20 VITALS — BP 122/78 | HR 69 | Ht 67.0 in | Wt 188.0 lb

## 2023-07-20 DIAGNOSIS — I493 Ventricular premature depolarization: Secondary | ICD-10-CM | POA: Diagnosis not present

## 2023-07-20 DIAGNOSIS — I10 Essential (primary) hypertension: Secondary | ICD-10-CM

## 2023-07-20 DIAGNOSIS — I420 Dilated cardiomyopathy: Secondary | ICD-10-CM

## 2023-07-20 DIAGNOSIS — I440 Atrioventricular block, first degree: Secondary | ICD-10-CM

## 2023-07-20 NOTE — Progress Notes (Unsigned)
Enrolled patient for a 3 day Zio XT monitor to be mailed to patients home   Lalla Brothers to read

## 2023-07-20 NOTE — Patient Instructions (Signed)
Medication Instructions:  Your physician recommends that you continue on your current medications as directed. Please refer to the Current Medication list given to you today.  *If you need a refill on your cardiac medications before your next appointment, please call your pharmacy*   Lab Work: NONE If you have labs (blood work) drawn today and your tests are completely normal, you will receive your results only by: MyChart Message (if you have MyChart) OR A paper copy in the mail If you have any lab test that is abnormal or we need to change your treatment, we will call you to review the results.   Testing/Procedures: Your physician has requested that you have an echocardiogram. Echocardiography is a painless test that uses sound waves to create images of your heart. It provides your doctor with information about the size and shape of your heart and how well your heart's chambers and valves are working. This procedure takes approximately one hour. There are no restrictions for this procedure. Please do NOT wear cologne, perfume, aftershave, or lotions (deodorant is allowed). Please arrive 15 minutes prior to your appointment time.  Please note: We ask at that you not bring children with you during ultrasound (echo/ vascular) testing. Due to room size and safety concerns, children are not allowed in the ultrasound rooms during exams. Our front office staff cannot provide observation of children in our lobby area while testing is being conducted. An adult accompanying a patient to their appointment will only be allowed in the ultrasound room at the discretion of the ultrasound technician under special circumstances. We apologize for any inconvenience.   ZIO AT Long term monitor-Live Telemetry  Your physician has requested you wear a ZIO patch monitor for 14 days.  This is a single patch monitor. [employer] supplies one patch monitor per enrollment. Additional  stickers are not available.  Please  do not apply patch if you will be having a Nuclear Stress Test, Echocardiogram, Cardiac CT, MRI,  or Chest Xray during the period you would be wearing the monitor. The patch cannot be worn during  these tests. You cannot remove and re-apply the ZIO AT patch monitor.  Your ZIO patch monitor will be mailed 3 day USPS to your address on file. It may take 3-5 days to  receive your monitor after you have been enrolled.  Once you have received your monitor, please review the enclosed instructions. Your monitor has  already been registered assigning a specific monitor serial # to you.   Billing and Patient Assistance Program information  Timothy Stone has been supplied with any insurance information on record for billing. [employer] offers a sliding scale Patient Assistance Program for patients without insurance, or whose  insurance does not completely cover the cost of the ZIO patch monitor. You must apply for the  Patient Assistance Program to qualify for the discounted rate. To apply, call [employer] at [phone],  select option 4, select option 2 , ask to apply for the Patient Assistance Program, (you can request an  interpreter if needed). [employer] will ask your household income and how many people are in your  household. [employer] will quote your out-of-pocket cost based on this information. They will also be able  to set up a 12 month interest free payment plan if needed.  Applying the monitor   Shave hair from upper left chest.  Hold the abrader disc by orange tab. Rub the abrader in 40 strokes over left upper chest as indicated in  your monitor  instructions.  Clean area with 4 enclosed alcohol pads. Use all pads to ensure the area is cleaned thoroughly. Let  dry.  Apply patch as indicated in monitor instructions. Patch will be placed under collarbone on left side of  chest with arrow pointing upward.  Rub patch adhesive wings for 2 minutes. Remove the white label marked "1". Remove the white label   marked "2". Rub patch adhesive wings for 2 additional minutes.  While looking in a mirror, press and release button in center of patch. A small green light will flash 3-4  times. This will be your only indicator that the monitor has been turned on.  Do not shower for the first 24 hours. You may shower after the first 24 hours.  Press the button if you feel a symptom. You will hear a small click. Record Date, Time and Symptom in  the Patient Log.   Starting the Gateway  In your kit there is a Audiological scientist box the size of a cellphone. This is Buyer, retail. It transmits all your  recorded data to [facility]. This box must always stay within 10 feet of you. Open the box and push the *  button. There will be a light that blinks orange and then green a few times. When the light stops  blinking, the Gateway is connected to the ZIO patch. Call [employer] at [phone] to confirm your monitor is transmitting.  Returning your monitor  Remove your patch and place it inside the Gateway. In the lower half of the Gateway there is a white  bag with prepaid postage on it. Place Gateway in bag and seal. Mail package back to [place] as soon as  possible. Your physician should have your final report approximately 7 days after you have mailed back  your monitor. Call [facility] Customer Care at [phone] if you have questions regarding your ZIO AT  patch monitor. Call them immediately if you see an orange light blinking on your monitor.  If your monitor falls off in less than 4 days, contact our Monitor department at [phone]. If your  monitor becomes loose or falls off after 4 days call [employer] at [phone] for suggestions on  securing your monitor    Follow-Up: At [facility], you and your health needs are our priority.  As part of our continuing mission to provide you with exceptional heart care, we have created designated Provider Care Teams.  These Care Teams include  your primary Cardiologist (physician) and Advanced Practice Providers (APPs -  Physician Assistants and Nurse Practitioners) who all work together to provide you with the care you need, when you need it.  We recommend signing up for the patient portal called "MyChart".  Sign up information is provided on this After Visit Summary.  MyChart is used to connect with patients for Virtual Visits (Telemedicine).  Patients are able to view lab/test results, encounter notes, upcoming appointments, etc.  Non-urgent messages can be sent to your provider as well.   To learn more about what you can do with MyChart, go to [email or web address].    Your next appointment:   3-4 month(s)  Provider:   Francis Dowse, PA-C

## 2023-07-23 DIAGNOSIS — I493 Ventricular premature depolarization: Secondary | ICD-10-CM

## 2023-07-30 DIAGNOSIS — I493 Ventricular premature depolarization: Secondary | ICD-10-CM | POA: Diagnosis not present

## 2023-08-02 ENCOUNTER — Other Ambulatory Visit: Payer: Self-pay | Admitting: Internal Medicine

## 2023-08-04 ENCOUNTER — Other Ambulatory Visit: Payer: Self-pay | Admitting: Medical Genetics

## 2023-08-24 ENCOUNTER — Ambulatory Visit (HOSPITAL_COMMUNITY): Payer: Medicare Other | Attending: Cardiology

## 2023-08-24 DIAGNOSIS — I440 Atrioventricular block, first degree: Secondary | ICD-10-CM | POA: Diagnosis not present

## 2023-08-24 DIAGNOSIS — I10 Essential (primary) hypertension: Secondary | ICD-10-CM

## 2023-08-24 DIAGNOSIS — I493 Ventricular premature depolarization: Secondary | ICD-10-CM

## 2023-08-24 DIAGNOSIS — I420 Dilated cardiomyopathy: Secondary | ICD-10-CM

## 2023-08-25 LAB — ECHOCARDIOGRAM COMPLETE
Area-P 1/2: 2.93 cm2
MV M vel: 5.32 m/s
MV Peak grad: 113.2 mm[Hg]
S' Lateral: 3.75 cm

## 2023-09-22 ENCOUNTER — Other Ambulatory Visit: Payer: Self-pay | Admitting: Hematology & Oncology

## 2023-09-22 ENCOUNTER — Other Ambulatory Visit: Payer: Self-pay | Admitting: Medical Genetics

## 2023-09-22 DIAGNOSIS — I824Z1 Acute embolism and thrombosis of unspecified deep veins of right distal lower extremity: Secondary | ICD-10-CM

## 2023-10-20 DIAGNOSIS — M7061 Trochanteric bursitis, right hip: Secondary | ICD-10-CM | POA: Diagnosis not present

## 2023-10-20 NOTE — Progress Notes (Signed)
 PATIENT: Timothy Tiedt Jr. MRN:  [id number] DOB:  11-09-1940 DATE OF SERVICE:  10/20/2023  Referring Physician:  No ref. provider found Primary Physician:  No primary care provider on file.  Chief Complaint:  Chief Complaint  Patient presents with  . Follow-up    Bilateral hip pain     SUBJECTIVE: Timothy Shad. is a 83 y.o. male who presents today for evaluation of the bilateral hips.  The patient was last seen here in August at which time he was diagnosed with some trochanteric bursitis and tendinitis.  He has not really gotten much better since his last visit.  The pain is not too severe.  He is able to walk and exercise and goes to the gym every day.  He is just worried busy has an upcoming trip where he is going to go on a lot of excursions and was worried about this impacting his vacation.  Current Outpatient Medications  Medication Sig Dispense Refill  . diclofenac  sodium (VOLTAREN ) 1 % gel Place onto the skin 4 (four) times a day as needed. 100 g 2   No current facility-administered medications for this visit.   No past medical history on file. No past surgical history on file. Social History   Social History Narrative  . Not on file   Allergies  Allergen Reactions  . Hydrocodone  Shortness Of Breath  . Latex Hives and Itching  . Oxycodone Confusion, Confusion/Altered Mental Status, Hallucinations, Other and Shortness Of Breath    Review of Systems:See below for the nursing review of systems which I have reviewed, edited and corrected.  ROS   VITALS: There were no vitals taken for this visit.  PHYSICAL EXAM:  The patient is a well nourished, well developed in no acute distress. Alert, oriented/interactive. Breathing is non labored.  Examination of the involved extremity reveals the skin to be intact and there is no adenopathy.  Physical exam reveals a nonantalgic gait.  He has no significant pain with motion of the hips or some mild trochanteric tenderness  bilaterally slightly greater on the right than the left.  XR Pelvis And Right  Hip Narrative: AP pelvis, and right hip x-rays were performed and reviewed in the office  today the showed, mild degenerative changes of the hips, no fractures or  bony lesions. Impression: Mild calcifications in the vessels.   ASSESSMENT:  1. Trochanteric bursitis of right hip      PLAN: I discussed the condition, exam and imaging studies with the patient/parents/family.  Findings were discussed with the patient.  He has some persistent pain in both hips.  It is likely that this is some trochanteric pain from bursitis and tendinitis but certainly could also be referred from the lumbar spine.  We discussed trying some Voltaren  gel as he cannot take NSAIDs due to being on anticoagulation.  If he does not derive much relief from this over the next few weeks he would want to try injections before his trip and we will set him up for an appointment for follow-up.   No orders of the defined types were placed in this encounter.   Follow up 2 to 3 weeks  Author:  Lavelle JAYSON Ada, MD 10/20/2023 2:25 PM

## 2023-10-24 NOTE — Progress Notes (Unsigned)
Cardiology Office Note:  .   Date:  10/24/2023  ID:  Timothy Stone., DOB 06-Sep-1941, MRN [id number] PCP: Wanda Plump, MD  [facility] Providers Cardiologist:  Lesleigh Noe, MD (Inactive) Electrophysiologist:  Lanier Prude, MD {  History of Present Illness: Timothy Stone. is a 83 y.o. male w/PMHx of PVCs, HTN, chronic CHF (combined), recurrent DVT (unprovoked) on [facility]  He saw Dr. Lalla Brothers 01/27/22, on/off symptoms of his PVCs, reported as infrequent, by symptoms, BP monitor and Kardia observations.  Some morning low BPs Going to the gym Bisoprolol continued Advised continued monitoring of his BP and f/u with his PMD Class I HF symptoms, last EF 45% Recommended 2 year follow up, sooner if needed  I saw him 07/20/23 He feels quite well When asked about dizziness, he tells me that when he is stooped over and been bending down for some time upon standing he will get a little lightheaded, resolves quickly He has noted that after he takes his BP pill in the morning his BP will dip towards low 100's and feels s little sluggish with this to be at the gym. He goes to the gym Mon-Sat morning, feels great with very good exertional capacity, takes his BP pills afterwards and this system has worked great for him. No near syncope or syncope. 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 Would no push nodal blocking agents Planned for 3 day monitor and echo If unchanged/better no changes to meds If elevated burden, ?consider amio  PVC burden 11% (DOWN from 35%) LVEF stable at  40-45%  With improved PVC burden, no symptoms, stable LVEF, 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 This has been for decades  No near syncope or syncope No bleeding or signs of bleeding PMD monitors his labs  Arrhythmia/AAD hx PVCs noted 2021 None to date  Studies Reviewed: Marland Kitchen    EKG not done today 07/20/23: SR 69bpm, 1st degree AVblock , couplets 06/16/23: SB 57bpm, 1st degree AVBlock , V trigeminy    08/24/23: TTE 1. Left ventricular ejection fraction, by estimation, is 40 to 45%. The  left ventricle has mildly decreased function. The left ventricle  demonstrates global hypokinesis. Left ventricular diastolic parameters are  consistent with Grade I diastolic  dysfunction (impaired relaxation).   2. Right ventricular systolic function is normal. The right ventricular  size is mildly enlarged. There is normal pulmonary artery systolic  pressure. The estimated right ventricular systolic pressure is 24.3 mmHg.  3. Left atrial size was moderately dilated.   4. Right atrial size was mildly dilated.   5. The mitral valve is normal in structure. Moderate mitral valve  regurgitation. No evidence of mitral stenosis.   6. The aortic valve is tricuspid. There is mild calcification of the  aortic valve. Aortic valve regurgitation is trivial. Aortic valve  sclerosis is present, with no evidence of aortic valve stenosis.   7. Aortic dilatation noted. There is mild dilatation  of the ascending  aorta, measuring 43 mm.   8. The inferior vena cava is normal in size with greater than 50%  respiratory variability, suggesting right atrial pressure of 3 mmHg.   Comparison(s): Prior EF 40-45%.   Nov 2024 monitor HR 45 - 87, average 58 bpm. Rare supraventricular ectopy. Frequent ventricular ectopy, 11.8%. No sustained arrhythmias. No atrial fibrillation.  Aug 2021 monitor Sinus rhythm and sinus bradycardia was the underlying rhythm. Heart rate ranged between 47 and 142 bpm. Frequent premature ventricular ectopic beats occurred, with a burden of 35%. Rare ventricular bigeminy and trigeminy was noted. A 4 beat run of ventricular tachycardia occurred. PACs were rare. First-degree AV block was noted.   Frequent premature ventricular contractions with burden approximately 35%.  Could be at risk for cardiomyopathy.   04/11/20: TTE 1. Left ventricular ejection fraction, by estimation, is 40 to 45% with  beat to beat variability due to ectopy. The left ventricle has mildly  decreased function. The left ventricle demonstrates global hypokinesis.  Left ventricular diastolic parameters  are consistent with Grade II diastolic dysfunction (pseudonormalization).  Elevated left ventricular end-diastolic pressure.   2. Right ventricular systolic function is normal. The right ventricular  size is mildly enlarged. Mildly increased right ventricular wall  thickness. There is normal pulmonary artery systolic pressure.   3. The mitral valve is grossly normal. Mild mitral valve regurgitation.  No evidence of mitral stenosis.   4. The aortic valve is tricuspid. Aortic valve regurgitation is not  visualized. No aortic stenosis is present.   5. Aortic dilatation noted. 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   6. The inferior vena cava is normal in size with greater than 50%  respiratory variability, suggesting right atrial pressure of 3 mmHg.   7. Left  atrial size was mildly dilated.   Comparison(s): A prior study was performed on 03/07/2018. Prior images  reviewed side by side. LVEF has decreased.    Risk Assessment/Calculations:    Physical Exam:   VS:  There were no vitals taken for this visit.   Wt Readings from Last 3 Encounters:  07/20/23 188 lb (85.3 kg)  07/08/23 187 lb 12.8 oz (85.2 kg)  06/16/23 184 lb 4 oz (83.6 kg)    GEN: Well nourished, well developed in no acute distress NECK: No JVD; No carotid bruits CARDIAC: RRR, no extrasystoles with prolonged auscultation no murmurs, rubs, gallops RESPIRATORY:  CTA b/l without rales, wheezing or rhonchi  ABDOMEN: Soft, non-tender, non-distended EXTREMITIES: No edema; No deformity     ASSESSMENT AND PLAN: .    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  Signed, Sheilah Pigeon, PA-C

## 2023-10-25 ENCOUNTER — Ambulatory Visit: Payer: Medicare Other | Admitting: Physician Assistant

## 2023-10-26 ENCOUNTER — Ambulatory Visit: Payer: Medicare Other | Admitting: Physician Assistant

## 2023-10-27 ENCOUNTER — Ambulatory Visit: Payer: Medicare Other | Attending: Physician Assistant | Admitting: Physician Assistant

## 2023-10-27 ENCOUNTER — Encounter: Payer: Self-pay | Admitting: Physician Assistant

## 2023-10-27 VITALS — BP 112/68 | HR 56 | Ht 67.0 in | Wt 193.4 lb

## 2023-10-27 DIAGNOSIS — I493 Ventricular premature depolarization: Secondary | ICD-10-CM

## 2023-10-27 DIAGNOSIS — I10 Essential (primary) hypertension: Secondary | ICD-10-CM

## 2023-10-27 NOTE — Patient Instructions (Signed)
 Medication Instructions:   Your physician recommends that you continue on your current medications as directed. Please refer to the Current Medication list given to you today.   *If you need a refill on your cardiac medications before your next appointment, please call your pharmacy*   Lab Work:  NONE ORDERED  TODAY     If you have labs (blood work) drawn today and your tests are completely normal, you will receive your results only by: MyChart Message (if you have MyChart) OR A paper copy in the mail If you have any lab test that is abnormal or we need to change your treatment, we will call you to review the results.   Testing/Procedures:  NONE ORDERED  TODAY     Follow-Up: At [facility], you and your health needs are our priority.  As part of our continuing mission to provide you with exceptional heart care, we have created designated Provider Care Teams.  These Care Teams include your primary Cardiologist (physician) and Advanced Practice Providers (APPs -  Physician Assistants and Nurse Practitioners) who all work together to provide you with the care you need, when you need it.  We recommend signing up for the patient portal called "MyChart".  Sign up information is provided on this After Visit Summary.  MyChart is used to connect with patients for Virtual Visits (Telemedicine).  Patients are able to view lab/test results, encounter notes, upcoming appointments, etc.  Non-urgent messages can be sent to your provider as well.   To learn more about what you can do with MyChart, go to [email or web address].    Your next appointment:    1 year(s)  Provider:    You may see Lanier Prude, MD or one of the following Advanced Practice Providers on your designated Care Team:   Francis Dowse, [place]  Other Instructions     1st Floor: - Lobby - Registration  - Pharmacy  - Lab - Cafe  2nd Floor: - PV Lab - Diagnostic Testing (echo, CT, nuclear med)  3rd  Floor: - Vacant  4th Floor: - TCTS (cardiothoracic surgery) - AFib Clinic - Structural Heart Clinic - Vascular Surgery  - Vascular Ultrasound  5th Floor: - HeartCare Cardiology (general and EP) - Clinical Pharmacy for coumadin, hypertension, lipid, weight-loss medications, and med management appointments    Valet parking services will be available as well.

## 2023-11-16 ENCOUNTER — Ambulatory Visit: Payer: Self-pay | Admitting: Internal Medicine

## 2023-11-16 NOTE — Telephone Encounter (Signed)
 Fyi. Pt going to ED.

## 2023-11-16 NOTE — Telephone Encounter (Signed)
 Copied From CRM [id number]. Reason for Triage: Patient stating he has been experiencing some stomach issues for the past 3 days. Vomiting and diarrhea, patient denies having a fever. Slight body aches. Callback number for patient is [phone] to discuss.     Chief Complaint: Hypotension Symptoms: low blood pressure, weak, nausea, vomiting, diarrhea Frequency: x 3 days for nausea/vomiting/diarrhea,  Pertinent Negatives: Patient denies dizzy, lightheadedness Disposition: [x] ED /[] Urgent Care (no appt availability in office) / [] Appointment(In office/virtual)/ []  [facility]/ [] Home Care/ [] Refused Recommended Disposition /[] [facility]/ []  Follow-up with PCP Additional Notes: Patient called and advised that he is experiencing an unsettled stomach. He said he had one episode of vomiting and one episode of diarrhea in the past few days.  Patient added that his blood pressure has also been very low lately. Patient took his blood pressure while on the phone with this triage RN:  81/55 11/16/2023 10:25 am   115/79 11/16/2023 at 10:30 am Patient states that yesterday (Monday) his systolic numbers were all below 90. He didn't check his blood pressure 2 days ago due to feeling so sick on his stomach. Saturday--blood pressures were systolic 113, and systolic 135. Patient denies feeling like he is going to pass out at this time but he does feel weak. Patient states that he believes he has been hydrating well. This RN asked the patient if he would like an ambulance and he denied wanting 911 services at this time. Patient's wife is present with the patient. She said they both were sick with stomach viruses.  She states that hers was worse than his.  They are both advised that the closest ER would be the recommended course of action at this time with his blood pressure being under 90 systolic two days in a row, feeling weak, and having a cardiac history.  Patient is unsure what it was 3  days ago because he was feeling so sick. Wife states that she believes dehydration might be a part of this.  They are advised that getting checked out by a provider at the ER at this time is recommended to be on the safe side.  They state that they will go to the ER for further evaluation at this time.  They are also advised that if at any point on the way to the hospital via person vehicle if they need 911, they can pull over and call 911 & an ambulance can meet them wherever they are.  They verbalized understanding.   Reason for Disposition  [1] Systolic BP < 90 AND [2] dizzy, lightheaded, or weak  Answer Assessment - Initial Assessment Questions 1. BLOOD PRESSURE: "What is the blood pressure?" "Did you take at least two measurements 5 minutes apart?"     81/55 11/16/2023 10:25 am 2. ONSET: "When did you take your blood pressure?"     11/16/2023 10:25 am 3. HOW: "How did you obtain the blood pressure?" (e.g., visiting nurse, automatic home BP monitor)     Cuff at home 4. HISTORY: "Do you have a history of low blood pressure?" "What is your blood pressure normally?"     He states that he has had low blood pressure and it is rare and he doesn't like it that low 5. MEDICINES: "Are you taking any medications for blood pressure?" If Yes, ask: "Have they been changed recently?"     Yes for hypertension but hasn't taken it lately 6. PULSE RATE: "Do you know what your pulse rate is?"  unsure 7. OTHER SYMPTOMS: "Have you been sick recently?" "Have you had a recent injury?"     Nausea/vomiting/diarrhea.  Protocols used: Blood Pressure - Low-A-AH

## 2023-11-16 NOTE — Telephone Encounter (Signed)
 Spoke w/ Pt- informed was calling to check on him, he is no longer having low BPs and didn't want to go to ED. He denies multiple episodes of diarrhea, only had 1 episode 3 days ago. Last BP checked around 1030 today and was 115/79. Informed to let us know if not improving.

## 2023-12-07 ENCOUNTER — Encounter: Payer: Self-pay | Admitting: Internal Medicine

## 2023-12-07 ENCOUNTER — Ambulatory Visit (INDEPENDENT_AMBULATORY_CARE_PROVIDER_SITE_OTHER): Payer: Medicare Other | Admitting: Internal Medicine

## 2023-12-07 VITALS — BP 112/78 | HR 55 | Temp 97.7°F | Resp 16 | Ht 67.0 in | Wt 192.1 lb

## 2023-12-07 DIAGNOSIS — Z0000 Encounter for general adult medical examination without abnormal findings: Secondary | ICD-10-CM

## 2023-12-07 DIAGNOSIS — L570 Actinic keratosis: Secondary | ICD-10-CM

## 2023-12-07 DIAGNOSIS — I10 Essential (primary) hypertension: Secondary | ICD-10-CM

## 2023-12-07 DIAGNOSIS — E119 Type 2 diabetes mellitus without complications: Secondary | ICD-10-CM | POA: Diagnosis not present

## 2023-12-07 DIAGNOSIS — E781 Pure hyperglyceridemia: Secondary | ICD-10-CM | POA: Diagnosis not present

## 2023-12-07 LAB — MICROALBUMIN / CREATININE URINE RATIO
Creatinine,U: 42.1 mg/dL
Microalb Creat Ratio: UNDETERMINED mg/g (ref 0.0–30.0)
Microalb, Ur: <0.7 mg/dL

## 2023-12-07 NOTE — Progress Notes (Signed)
 Subjective:    Patient ID: Timothy Stone., male    DOB: 03-27-1941, 83 y.o.   MRN: [id number]  DOS:  12/07/2023 Type of visit - description: cpx  Here for CPX. Had a episodes of nausea vomiting diarrhea 3 weeks ago, no fever, no blood in the stools.  All symptoms resolved.  Wife had similar symptoms.  Review of Systems  Other than above, a 14 point review of systems is negative      Past Medical History:  Diagnosis Date   Actinic keratosis    hyperplastic aktinic keratosis forehead s/p surgery Dr Park Liter   Allergy    Arthritis    BPH (benign prostatic hyperplasia)    s/p TUNA   Cataract    Chronic cystitis    Dr. Liliane Shi   Diabetes mellitus without complication (HCC)    DVT (deep venous thrombosis) (HCC) 2005   no know triggers per pt   Heart murmur    HTN (hypertension)    dx in the 70s   OSA (obstructive sleep apnea)    on CPAP   Recurrent UTI    Sleep apnea    Umbilical hernia    no surgery   Vitamin D deficiency     Past Surgical History:  Procedure Laterality Date   PROSTATE SURGERY  2003   TUNA around 2003   Social History   Socioeconomic History   Marital status: Married    Spouse name: Not on file   Number of children: 4   Years of education: Not on file   Highest education level: Associate degree: academic program  Occupational History   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Music therapist, Art gallery manager, 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 Less than 1 per week.   Drug use: Never   Sexual activity: Yes  Other Topics Concern   Not on file  Social History Narrative   Lives w/ wife   Lost a son 04/2021         Social Drivers of Health   Financial Resource Strain: Low Risk  (07/07/2023)   Overall Financial Resource Strain (CARDIA)    Difficulty of Paying Living  Expenses: Not hard at all  Food Insecurity: No Food Insecurity (07/07/2023)   Hunger Vital Sign    Worried About Running Out of Food in the Last Year: Never true    Ran Out of Food in the Last Year: Never true  Transportation Needs: No Transportation Needs (07/07/2023)   PRAPARE - Administrator, Civil Service (Medical): No    Lack of Transportation (Non-Medical): No  Physical Activity: Sufficiently Active (07/07/2023)   Exercise Vital Sign    Days of Exercise per Week: 6 days    Minutes of Exercise per Session: 80 min  Stress: No Stress Concern Present (07/07/2023)   [employer] of Occupational Health - Occupational Stress Questionnaire    Feeling of Stress : Not at all  Social Connections: Socially Integrated (07/07/2023)   Social Connection and Isolation Panel [NHANES]    Frequency of Communication with Friends and Family: Twice a week    Frequency of Social Gatherings with Friends and Family: More than three times a week    Attends Religious Services: More than 4 times per year    Active Member of  Clubs or Organizations: Yes    Attends Banker Meetings: More than 4 times per year    Marital Status: Married  Catering manager Violence: Not At Risk (07/08/2023)   Humiliation, Afraid, Rape, and Kick questionnaire    Fear of Current or Ex-Partner: No    Emotionally Abused: No    Physically Abused: No    Sexually Abused: No     Current Outpatient Medications  Medication Instructions   acetaminophen (TYLENOL) 1,000 mg, Daily PRN   azelastine (ASTELIN) 0.1 % nasal spray 2 sprays, Each Nare, 2 times daily, Use in each nostril as directed   bisoprolol-hydrochlorothiazide (ZIAC) 5-6.25 MG tablet TAKE 1 TABLET BY MOUTH DAILY  OKAY TO TAKE A SECOND TABLET IF  BLOOD PRESSURE IS MORE THAN  140/85   diclofenac Sodium (VOLTAREN) 2 g, 4 times daily   doxazosin (CARDURA) 4 mg, Oral, Daily at bedtime   Eliquis 2.5 mg, Oral, 2 times daily   Flaxseed Oil OIL 1,400  mg, Daily   fluticasone (FLONASE) 50 MCG/ACT nasal spray 2 sprays, Each Nare, Daily PRN   loratadine (CLARITIN) 10 mg, Daily   OVER THE COUNTER MEDICATION 1 tablet, Daily   rosuvastatin (CRESTOR) 10 mg, Oral, Daily   Vitamin D3 2,000 Units, Daily       Objective:   Physical Exam BP 112/78   Pulse (!) 55   Temp 97.7 F (36.5 C) (Oral)   Resp 16   Ht 5\' 7"  (1.702 m)   Wt 192 lb 2 oz (87.1 kg)   SpO2 97%   BMI 30.09 kg/m  General: Well developed, NAD, BMI noted Neck: No  thyromegaly  HEENT:  Normocephalic . Face symmetric, atraumatic Lungs:  CTA B Normal respiratory effort, no intercostal retractions, no accessory muscle use. Heart: RRR,  no murmur.  Abdomen:  Not distended, soft, non-tender. No rebound or rigidity.   Lower extremities: no pretibial edema bilaterally  Skin: Exposed areas without rash. Not pale. Not jaundice Neurologic:  alert & oriented X3.  Speech normal, gait appropriate for age and unassisted Strength symmetric and appropriate for age.  Psych: Cognition and judgment appear intact.  Cooperative with normal attention span and concentration.  Behavior appropriate. No anxious or depressed appearing.     Assessment     Assessment: DM (A1c 6.5 2020) HTN CV:  --Bigeminy, frequent PVCs, mild CHF, sees cardiology --Holter 03/17/2018: Frequent PVCs in bigeminy and trigeminy, PVC burden 19% --Holter 05/01/2020: Frequent PVCs, burden 35%.  See full report. --Echo was done, see report BPH, recurrent UTIs, chronic cystitis OSA, on CPAP Umbilical hernia Actinic keratosis; no derm eval recently as off 02/2018 Tinnitus  H/o vitamin D deficiency H/o R leg  DVT, 2005 and 2020, unprovoked, hematology Rx lifelong anticoagulant   PLAN: Here for CPX -Td 2015 - pneumonia shot 02/2018;  prevnar 02-2014; PNM 20:   11/2022 -  Zostavax:2011, s/p shingrex  s/p RSV -Recommend: Tdap,  flu shot every fall  --CCS:  Cscope @ [facility] 11-08-2001 Dr Loman Chroman: small  internal Hemorrhoids. Cscope 05-12-2012, 1 polyp, tubular adenoma, Cscope again 03/30/17, no report.. Cscope [id number] , next per GI -- History of BPH, sees urology. --Labs:  BMP AST ALT CBC A1c -- Has a healthy lifestyle.  Exercises 6 times a week. -- Healthcare POA: See AVS We also discussed other issues: DM: Diet controlled, check A1c. HTN: BP today is very good, no change, recommend ambulatory BPs, continue Ziac, Cardura.  Check labs High cholesterol: On Crestor, well-controlled. Actinic  keratosis: Has not seen dermatology in a while.  Request a referral.  Will do. Lifelong anticoagulation: Good compliance and tolerance RTC 6 months

## 2023-12-07 NOTE — Patient Instructions (Addendum)
 Vaccines I recommend: Tdap (tetanus) Flu shot every fall* Continue exercising regularly.  Take Tylenol as needed for aches and pains.    Check the  blood pressure regularly Blood pressure goal:  between 110/65 and  135/85. If it is consistently higher or lower, let me know     GO TO THE LAB : Get the blood work     Please go to the front desk: Arrange for a follow-up in 6 months       "Health Care Power of attorney" (Also know as a  "Living will" or  Advance care planning documents)  If you already have a living will or healthcare power of attorney, is recommended you bring the copy to be scanned in your chart.   The document will be available to all the doctors you see in the system.  If you are over 43 y/o and don't have the document, please read:  Advance care planning is a process that supports adults in  understanding and sharing their preferences regarding future medical care.  The patient's preferences are recorded in documents called Advance Directives and the can be modified at any time while the patient is in full mental capacity.     More information at: [email or web address]

## 2023-12-07 NOTE — Assessment & Plan Note (Signed)
 Here for CPX  We also discussed other issues: DM: Diet controlled, check A1c. HTN: BP today is very good, no change, recommend ambulatory BPs, continue Ziac, Cardura.  Check labs High cholesterol: On Crestor, well-controlled. Actinic keratosis: Has not seen dermatology in a while.  Request a referral.  Will do. Lifelong anticoagulation: Good compliance and tolerance RTC 6 months

## 2023-12-07 NOTE — Assessment & Plan Note (Signed)
 Here for CPX -Td 2015 - pneumonia shot 02/2018;  prevnar 02-2014; PNM 20:   11/2022 -  Zostavax:2011, s/p shingrex  s/p RSV -Recommend: Tdap,  flu shot every fall  --CCS:  Cscope @ [facility] 11-08-2001 Dr Loman Chroman: small internal Hemorrhoids. Cscope 05-12-2012, 1 polyp, tubular adenoma, Cscope again 03/30/17, no report.. Cscope [id number] , next per GI -- History of BPH, sees urology. --Labs:  BMP AST ALT CBC A1c -- Has a healthy lifestyle.  Exercises 6 times a week. -- Healthcare POA: See AVS

## 2023-12-08 LAB — CBC WITH DIFFERENTIAL/PLATELET
Basophils Absolute: 0 10*3/uL (ref 0.0–0.1)
Basophils Relative: 0.2 % (ref 0.0–3.0)
Eosinophils Absolute: 0 10*3/uL (ref 0.0–0.7)
Eosinophils Relative: 0.6 % (ref 0.0–5.0)
HCT: 40.2 % (ref 39.0–52.0)
Hemoglobin: 13.4 g/dL (ref 13.0–17.0)
Lymphocytes Relative: 34.8 % (ref 12.0–46.0)
Lymphs Abs: 2.3 10*3/uL (ref 0.7–4.0)
MCHC: 33.3 g/dL (ref 30.0–36.0)
MCV: 90.4 fl (ref 78.0–100.0)
Monocytes Absolute: 0.4 10*3/uL (ref 0.1–1.0)
Monocytes Relative: 6.6 % (ref 3.0–12.0)
Neutro Abs: 3.8 10*3/uL (ref 1.4–7.7)
Neutrophils Relative %: 57.8 % (ref 43.0–77.0)
Platelets: 249 10*3/uL (ref 150.0–400.0)
RBC: 4.45 Mil/uL (ref 4.22–5.81)
RDW: 14.4 % (ref 11.5–15.5)
WBC: 6.5 10*3/uL (ref 4.0–10.5)

## 2023-12-08 LAB — BASIC METABOLIC PANEL WITH GFR
BUN: 28 mg/dL — ABNORMAL HIGH (ref 6–23)
CO2: 28 meq/L (ref 19–32)
Calcium: 9.5 mg/dL (ref 8.4–10.5)
Chloride: 102 meq/L (ref 96–112)
Creatinine, Ser: 1.06 mg/dL (ref 0.40–1.50)
GFR: 64.9 mL/min (ref >60.00)
Glucose, Bld: 79 mg/dL (ref 70–99)
Potassium: 4.4 meq/L (ref 3.5–5.1)
Sodium: 138 meq/L (ref 135–145)

## 2023-12-08 LAB — ALT: ALT: 20 U/L (ref 0–53)

## 2023-12-08 LAB — HEMOGLOBIN A1C: Hgb A1c MFr Bld: 6.3 % (ref 4.6–6.5)

## 2023-12-08 LAB — AST: AST: 18 U/L (ref 0–37)

## 2023-12-10 ENCOUNTER — Encounter: Payer: Self-pay | Admitting: Internal Medicine

## 2023-12-13 DIAGNOSIS — L814 Other melanin hyperpigmentation: Secondary | ICD-10-CM | POA: Diagnosis not present

## 2023-12-13 DIAGNOSIS — D1801 Hemangioma of skin and subcutaneous tissue: Secondary | ICD-10-CM | POA: Diagnosis not present

## 2023-12-13 DIAGNOSIS — L218 Other seborrheic dermatitis: Secondary | ICD-10-CM | POA: Diagnosis not present

## 2023-12-20 ENCOUNTER — Other Ambulatory Visit: Payer: Self-pay | Admitting: Internal Medicine

## 2024-01-04 ENCOUNTER — Other Ambulatory Visit: Payer: Self-pay | Admitting: Internal Medicine

## 2024-02-24 ENCOUNTER — Other Ambulatory Visit

## 2024-02-24 DIAGNOSIS — Z006 Encounter for examination for normal comparison and control in clinical research program: Secondary | ICD-10-CM

## 2024-03-03 LAB — GENECONNECT MOLECULAR SCREEN: Genetic Analysis Overall Interpretation: NEGATIVE

## 2024-04-03 ENCOUNTER — Telehealth: Payer: Self-pay | Admitting: Student in an Organized Health Care Education/Training Program

## 2024-04-03 ENCOUNTER — Emergency Department (HOSPITAL_COMMUNITY)

## 2024-04-03 ENCOUNTER — Emergency Department (HOSPITAL_COMMUNITY)
Admission: EM | Admit: 2024-04-03 | Discharge: 2024-04-03 | Disposition: A | Discharge status: Home / Self Care | Attending: Emergency Medicine | Admitting: Emergency Medicine

## 2024-04-03 ENCOUNTER — Encounter (HOSPITAL_COMMUNITY): Payer: Self-pay

## 2024-04-03 ENCOUNTER — Other Ambulatory Visit: Payer: Self-pay

## 2024-04-03 DIAGNOSIS — Q245 Malformation of coronary vessels: Secondary | ICD-10-CM | POA: Diagnosis not present

## 2024-04-03 DIAGNOSIS — R161 Splenomegaly, not elsewhere classified: Secondary | ICD-10-CM | POA: Diagnosis not present

## 2024-04-03 DIAGNOSIS — R0789 Other chest pain: Secondary | ICD-10-CM | POA: Diagnosis not present

## 2024-04-03 DIAGNOSIS — R079 Chest pain, unspecified: Secondary | ICD-10-CM | POA: Diagnosis not present

## 2024-04-03 DIAGNOSIS — I5042 Chronic combined systolic (congestive) and diastolic (congestive) heart failure: Secondary | ICD-10-CM | POA: Diagnosis not present

## 2024-04-03 DIAGNOSIS — R748 Abnormal levels of other serum enzymes: Secondary | ICD-10-CM | POA: Insufficient documentation

## 2024-04-03 DIAGNOSIS — Z91040 Latex allergy status: Secondary | ICD-10-CM | POA: Insufficient documentation

## 2024-04-03 DIAGNOSIS — Z7901 Long term (current) use of anticoagulants: Secondary | ICD-10-CM | POA: Insufficient documentation

## 2024-04-03 DIAGNOSIS — I110 Hypertensive heart disease with heart failure: Secondary | ICD-10-CM | POA: Insufficient documentation

## 2024-04-03 DIAGNOSIS — I719 Aortic aneurysm of unspecified site, without rupture: Secondary | ICD-10-CM | POA: Diagnosis not present

## 2024-04-03 DIAGNOSIS — D1809 Hemangioma of other sites: Secondary | ICD-10-CM | POA: Diagnosis not present

## 2024-04-03 DIAGNOSIS — M546 Pain in thoracic spine: Secondary | ICD-10-CM | POA: Insufficient documentation

## 2024-04-03 DIAGNOSIS — I493 Ventricular premature depolarization: Secondary | ICD-10-CM | POA: Insufficient documentation

## 2024-04-03 LAB — COMPREHENSIVE METABOLIC PANEL WITH GFR
ALT: 17 U/L (ref 0–44)
AST: 21 U/L (ref 15–41)
Albumin: 4.1 g/dL (ref 3.5–5.0)
Alkaline Phosphatase: 66 U/L (ref 38–126)
Anion gap: 9 (ref 5–15)
BUN: 26 mg/dL — ABNORMAL HIGH (ref 8–23)
CO2: 27 mmol/L (ref 22–32)
Calcium: 9.5 mg/dL (ref 8.9–10.3)
Chloride: 104 mmol/L (ref 98–111)
Creatinine, Ser: 1.09 mg/dL (ref 0.61–1.24)
GFR, Estimated: >60 mL/min (ref >60)
Glucose, Bld: 134 mg/dL — ABNORMAL HIGH (ref 70–99)
Potassium: 3.8 mmol/L (ref 3.5–5.1)
Sodium: 140 mmol/L (ref 135–145)
Total Bilirubin: 0.5 mg/dL (ref 0.0–1.2)
Total Protein: 7.7 g/dL (ref 6.5–8.1)

## 2024-04-03 LAB — TROPONIN I (HIGH SENSITIVITY)
Troponin I (High Sensitivity): 4 ng/L (ref <18)
Troponin I (High Sensitivity): 5 ng/L (ref <18)

## 2024-04-03 LAB — CBC
HCT: 41.3 % (ref 39.0–52.0)
Hemoglobin: 13.7 g/dL (ref 13.0–17.0)
MCH: 29.7 pg (ref 26.0–34.0)
MCHC: 33.2 g/dL (ref 30.0–36.0)
MCV: 89.6 fL (ref 80.0–100.0)
Platelets: 188 K/uL (ref 150–400)
RBC: 4.61 MIL/uL (ref 4.22–5.81)
RDW: 13.2 % (ref 11.5–15.5)
WBC: 6.2 K/uL (ref 4.0–10.5)
nRBC: 0 % (ref 0.0–0.2)

## 2024-04-03 LAB — D-DIMER, QUANTITATIVE: D-Dimer, Quant: 0.58 ug{FEU}/mL — ABNORMAL HIGH (ref 0.00–0.50)

## 2024-04-03 LAB — LIPASE, BLOOD: Lipase: 52 U/L — ABNORMAL HIGH (ref 11–51)

## 2024-04-03 MED ORDER — IOHEXOL 350 MG/ML SOLN
100.0000 mL | Freq: Once | INTRAVENOUS | Status: AC | PRN
  Administered 2024-04-03: 100 mL via INTRAVENOUS

## 2024-04-03 NOTE — ED Provider Notes (Signed)
 [place] EMERGENCY DEPARTMENT AT [facility] Provider Note   CSN: [id number] Arrival date & time: 04/03/24  0017     Patient presents with: Chest Pain   Timothy Stone. is a 83 y.o. male.   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  He describes a pulse to his right chest that last for just a second or 2 at a time and resolves.  Occasionally feels in his upper back as well.  Symptoms happen pretty frequently and recur after a few seconds or a few minutes.  Has been having consistently for the past 1 day but symptoms do not last for longer than a few seconds at a time.  He is chest pain-free currently.  No radiation of the pain to his arm.  No shortness of breath, cough, fever, nausea, vomiting, diaphoresis.  Denies history of CAD.  Does take Eliquis  for history of DVT and denies any recent missed doses.  No abdominal pain, nausea, vomiting, diaphoresis.  No recent medication changes.  No dizziness or lightheadedness.  Follows with cardiology for history of PVCs.  Patient states he was some kind of exercise with his arm bent at his side and believes he may have injured his chest doing that.  The history is provided by the patient and a relative.  Chest Pain Associated symptoms: shortness of breath   Associated symptoms: no dizziness, no fever, no headache, no nausea, no vomiting and no weakness        Prior to Admission medications   Medication Sig Start Date End Date Taking? Authorizing Provider  acetaminophen (TYLENOL) 500 MG tablet Take 1,000 mg by mouth daily as needed for mild pain or headache.    [provider]  azelastine  (ASTELIN ) 0.1 % nasal spray Place 2 sprays into both nostrils 2 (two) times daily. Use in each nostril as directed 05/22/22   Jason Leita Repine, FNP  bisoprolol -hydrochlorothiazide   (ZIAC ) 5-6.25 MG tablet Take 1 tablet by mouth daily. Take a second tablet if blood pressure if more than 140/85 12/20/23   Paz, Jose E, MD  Cholecalciferol (VITAMIN D3) 2000 units TABS Take 2,000 Units by mouth daily.     [provider]  diclofenac  Sodium (VOLTAREN ) 1 % GEL Apply 2 g topically 4 (four) times daily.    [provider]  doxazosin  (CARDURA ) 4 MG tablet Take 1 tablet (4 mg total) by mouth at bedtime. 11/29/17   Paz, Jose E, MD  ELIQUIS  2.5 MG TABS tablet TAKE 1 TABLET BY MOUTH TWICE  DAILY 09/22/23   Ennever, Peter R, MD  Flaxseed Oil OIL Take 1,400 mg by mouth daily.     [provider]  fluticasone  (FLONASE ) 50 MCG/ACT nasal spray Place 2 sprays into both nostrils daily as needed for allergies or rhinitis. 01/04/24   Paz, Jose E, MD  loratadine (CLARITIN) 10 MG tablet Take 10 mg by mouth daily.    [provider]  OVER THE COUNTER MEDICATION Take 1 tablet by mouth daily. Prostate Plus Health Complex w/ Cran-Max-Cranberry 1 daily    [provider]  rosuvastatin  (CRESTOR ) 10 MG tablet TAKE 1 TABLET BY MOUTH DAILY 04/21/23   Paz, Jose E, MD    Allergies: Hydrocodone , Oxycodone, and Latex    Review of Systems  Constitutional:  Negative for activity change, appetite change and fever.  HENT:  Negative for congestion and rhinorrhea.   Respiratory:  Positive for chest tightness and shortness of breath.   Cardiovascular:  Positive for chest pain.  Gastrointestinal:  Negative for nausea and vomiting.  Genitourinary:  Negative for dysuria and hematuria.  Musculoskeletal:  Negative for arthralgias.  Skin:  Negative for rash.  Neurological:  Negative for dizziness, weakness and headaches.   all other systems are negative except as noted in the HPI and PMH.    Updated Vital Signs BP (!) 171/94   Pulse 62   Temp 98 F (36.7 C) (Oral)   Resp 17   SpO2 98%   Physical Exam Vitals and nursing note reviewed.  Constitutional:      General: He  is not in acute distress.    Appearance: He is well-developed.     Comments: Well-appearing, no distress, frequent PVCs  HENT:     Head: Normocephalic and atraumatic.     Mouth/Throat:     Pharynx: No oropharyngeal exudate.  Eyes:     Conjunctiva/sclera: Conjunctivae normal.     Pupils: Pupils are equal, round, and reactive to light.  Neck:     Comments: No meningismus. Cardiovascular:     Rate and Rhythm: Normal rate and regular rhythm.     Heart sounds: Normal heart sounds. No murmur heard. Pulmonary:     Effort: Pulmonary effort is normal. No respiratory distress.     Breath sounds: Normal breath sounds.     Comments: Chest pain not reproducible, no rash Chest:     Chest wall: No tenderness.  Abdominal:     Palpations: Abdomen is soft.     Tenderness: There is no abdominal tenderness. There is no guarding or rebound.  Musculoskeletal:        General: No tenderness. Normal range of motion.     Cervical back: Normal range of motion and neck supple.  Skin:    General: Skin is warm.  Neurological:     Mental Status: He is alert and oriented to person, place, and time.     Cranial Nerves: No cranial nerve deficit.     Motor: No abnormal muscle tone.     Coordination: Coordination normal.     Comments:  5/5 strength throughout. CN 2-12 intact.Equal grip strength.   Psychiatric:        Behavior: Behavior normal.     (all labs ordered are listed, but only abnormal results are displayed) Labs Reviewed  COMPREHENSIVE METABOLIC PANEL WITH GFR - Abnormal; Notable for the following components:      Result Value   Glucose, Bld 134 (*)    BUN 26 (*)    All other components within normal limits  D-DIMER, QUANTITATIVE - Abnormal; Notable for the following components:   D-Dimer, Quant 0.58 (*)    All other components within normal limits  LIPASE, BLOOD - Abnormal; Notable for the following components:   Lipase 52 (*)    All other components within normal limits  CBC  TROPONIN I  (HIGH SENSITIVITY)  TROPONIN I (HIGH SENSITIVITY)    EKG: EKG Interpretation Date/Time:  Monday April 03 2024 00:26:28 EDT Ventricular Rate:  55 PR Interval:  119 QRS Duration:  100 QT Interval:  456 QTC Calculation: 437 R Axis:   19  Text Interpretation: Sinus rhythm Ventricular bigeminy Borderline short PR interval No significant change was found Confirmed by Carita Senior [phone]) on 04/03/2024 12:34:03 AM  Radiology: CT Angio Chest/Abd/Pel for Dissection W and/or Wo Contrast Result  Date: 04/03/2024 CLINICAL DATA:  Right-sided chest pain radiating to the back times 1-1/2 days. Acute aortic syndrome suspected. EXAM: CT ANGIOGRAPHY CHEST, ABDOMEN AND PELVIS TECHNIQUE: Non-contrast CT of the chest was initially obtained. Multidetector CT imaging through the chest, abdomen and pelvis was performed using the standard protocol during bolus administration of intravenous contrast. Multiplanar reconstructed images and MIPs were obtained and reviewed to evaluate the vascular anatomy. RADIATION DOSE REDUCTION: This exam was performed according to the departmental dose-optimization program which includes automated exposure control, adjustment of the mA and/or kV according to patient size and/or use of iterative reconstruction technique. CONTRAST:  OMNIPAQUE  IOHEXOL  350 MG/ML SOLN COMPARISON:  No prior cross-sectional imaging for comparison. Last chest series was PA and lateral from today, before that PA and lateral 03/21/2020. FINDINGS: CTA CHEST FINDINGS Cardiovascular: The cardiac size is normal. There is no pericardial effusion. There are left main and single-vessel coronary artery patchy calcifications in LAD. Preferential opacification of the aorta and great vessels. There are patchy aortic mixed plaques, mild aortic tortuosity. The aortic root is 4.2 cm at the sinuses on 9:67. There is no aortic dissection, penetrating ulcer or stenosis. There is fusiform ectasia in the ascending segment which  measures 4.2 x 4.2 cm on 9:66 and on 10:118. There is a 2 vessel aortic arch with normal variant short-segment brachiobicarotid trunk. There is calcific plaque in the mid segment of the left subclavian artery. The great vessels are widely patent, otherwise unremarkable. Pulmonary arteries and veins are normal in caliber. The pulmonary arteries are centrally clear on this nontailored exam. Mediastinum/Nodes: No enlarged mediastinal, hilar, or axillary lymph nodes. Thyroid  gland, trachea, and esophagus demonstrate no significant findings. Lungs/Pleura: There is mild reticular scarring at the lung apices. Linear scarring or atelectasis in the lower lobes and lingular base. Calcified granulomas in the right middle and left upper lobes. No noncalcified pulmonary nodules are seen. The no consolidation, pleural effusion or pneumothorax. Central airways are clear. Musculoskeletal: Osteopenia and and degenerative change thoracic spine, multilevel degenerative discs and endplate irregularities. 2 cm hemangioma anteriorly in the T10 vertebral body but no suspicious lesions. No acute or other significant osseous findings. Unremarkable chest wall. Review of the MIP images confirms the above findings. CTA ABDOMEN AND PELVIS FINDINGS VASCULAR Aorta: Scattered calcific plaque in the suprarenal abdominal aorta. Moderate patchy mixed plaque in the infrarenal segment. There is no penetrating ulcer, aneurysm, stenosis or dissection. Celiac: Patent without evidence of aneurysm, dissection, vasculitis or significant stenosis. There is nonstenosing calcific plaque at the vessel origin, scattered nonstenosing calcification in the splenic artery. SMA: There is a smooth 40-50% soft plaque stenosis in the proximal 1 cm of the vessel, slight poststenotic ectasia. The SMA and branches are otherwise widely patent. Renals: 2 arteries supply the left kidney and a single artery supplies the right. Right renal artery and its branches are widely  patent. On the left, there is a high-grade, 90% or greater focal stenosis a few mm distal to the vessel origin due to soft plaque involving the dominant of the 2 renal arteries, which supplies the upper and midpole. The nondominant lower pole artery is widely patent. The rest of the dominant left renal artery is also widely patent. No branch occlusion is seen. IMA: Patent without evidence of aneurysm, dissection, vasculitis or significant stenosis. Inflow: The bilateral inflow arteries are tortuous but widely patent. There is patchy calcific plaque in the common iliac and internal iliac arteries but the external iliac arteries are plaque free. Veins:  Unopacified and not evaluated. Review of the MIP images confirms the above findings. NON-VASCULAR Hepatobiliary: Some images suggest equivocal pericholecystic edema but there is no overt wall thickening. There are small stones in the proximal lumen. Consider ultrasound if there is diagnostic uncertainty as to possible cholecystitis. This could be exaggerated due to breathing motion through the area. Fine detail through the liver is limited due to respiratory motion. No mass is seen through the motion artifact. The liver is mildly steatotic. Pancreas: No abnormality. Spleen: Mildly enlarged measuring 16.3 cm in length. There is no mass enhancement. There are linear capsular calcifications laterally along the lower pole consistent with a remote splenic hemorrhage or infarct. Adrenals/Urinary Tract: Slight nodular thickening in both adrenal glands, too small to characterize, typically benign. Perinephric stranding is seen and probably chronic. There is a left interpolar parapelvic Bosniak 1 cyst measuring 2.4 cm and 10 Hounsfield units. No follow-up imaging is recommended. The remainder of the kidneys are unremarkable. There is no urinary stone or obstruction. Bladder is mildly distended but has a normal wall thickness, with moderate impression into the bladder base by  median lobe hypertrophy of the prostate gland. Stomach/Bowel: No dilatation or wall thickening including the appendix. Lymphatic: No adenopathy. Reproductive: Moderate to severe prostatomegaly, transverse axis 5.8 cm. Median lobe hypertrophy impresses into the bladder base. Other: Umbilical and bilateral inguinal fat hernias. No incarcerated hernia. No free fluid, free hemorrhage or free air. Musculoskeletal: Advanced degenerative disc disease and spondylosis lumbar spine. Bridging osteophytes anteriorly at the SI joints. Mild hip DJD. No acute or significant osseous findings. Review of the MIP images confirms the above findings. IMPRESSION: 1. Aortic and coronary artery atherosclerosis. No aortic dissection, stenosis or penetrating ulcer. 2. 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 Recommend annual imaging followup by CTA or MRA. This recommendation follows 2010 ACCF/AHA/AATS/ACR/ASA/SCA/SCAI/SIR/STS/ SVM Guidelines for the Diagnosis and Management of Patients with Thoracic Aortic Disease. Circulation. 2010; 121: Z733-z630. 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 3. 40-50% soft plaque stenosis in the proximal 1 cm of the SMA. 4. 90% or greater focal stenosis in the dominant left renal artery a few mm distal to the vessel origin. Otherwise opacifies well. 5. Cholelithiasis with equivocal pericholecystic edema. Consider ultrasound if there is diagnostic uncertainty as to possible cholecystitis. 6. Mild hepatic steatosis. 7. Mild splenomegaly. 8. Moderate to severe prostatomegaly with impression into the bladder base. 9. Umbilical and bilateral inguinal fat hernias. Aortic Atherosclerosis (ICD10-I70.0). Electronically Signed   By: Francis Quam M.D.   On: 04/03/2024 03:52   DG Chest 2 View Result Date: 04/03/2024 CLINICAL DATA:  Chest pain EXAM: CHEST - 2 VIEW COMPARISON:  03/21/2020 FINDINGS: The heart size and mediastinal contours are within normal limits. Minimal scarring is noted in the left base. No focal  infiltrate is seen. The visualized skeletal structures are unremarkable. IMPRESSION: No active cardiopulmonary disease. Electronically Signed   By: Oneil Devonshire M.D.   On: 04/03/2024 01:06     Procedures   Medications Ordered in the ED - No data to display                                  Medical Decision Making Amount and/or Complexity of Data Reviewed Labs: ordered. Decision-making details documented in ED Course. Radiology: ordered and independent interpretation performed. Decision-making details documented in ED Course. ECG/medicine tests: ordered and independent interpretation performed. Decision-making details documented in ED Course.  Risk Prescription drug  management.   Intermittent pulsing to right chest that comes and goes lasting for few seconds at a time.  Is not consistent.  No associated shortness of breath.  EKG is sinus rhythm with bigeminy and PVCs.  Description is atypical for ACS.  May be related to PVCs.  Initial EKG did show prolonged PR and concern for Wenckebach but no dropped beats.  Reviewed with Dr. Almetta of cardiology who agrees multiple PVCs without evidence of high degree AV block.  Would not recommend medication changes at this time  Workup reassuring.  Description of pain is atypical for ACS.  Troponin negative x 2.  Age-adjusted D-dimer is negative suspicion for PE.  No missed doses of Eliquis .  CT scan shows no evidence of aortic dissection.  Results reviewed with patient and family at bedside.  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  He does have gallstones but no evidence of cholecystitis.  He has no right upper quadrant pain.  Normal LFTs.  Lipase mildly up at 52.  He declines gallbladder ultrasound today which was offered.  Low suspicion for acute cholecystitis.  Patient feels like his symptoms may be related to doing his exercises.  However pain does not  appear to be musculoskeletal.  Troponin is negative x 2 with low suspicion for ACS.  Follow-up with his cardiologist for medication adjustments as his symptoms may coincide with PVCs. Patient made aware of thoracic aneurysm and need for annual follow-up.  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  Return to the ED with exertional chest pain, pain associate with shortness of breath, nausea, vomiting, sweating or other concerns.    Final diagnoses:  Atypical chest pain  PVC (premature ventricular contraction)    ED Discharge Orders     None          Cherrise Occhipinti, Garnette, MD 04/03/24 0600

## 2024-04-03 NOTE — Discharge Instructions (Addendum)
 No evidence of heart attack or blood clot in the lung.  You do have an aneurysm of your aorta in your chest.  This should be followed once a year to make sure it is not increasing in size.  You also have some narrowing of blood vessels in your abdomen called the SMA and left renal artery.  You can follow-up with the vascular surgeon regarding these.  The treatment for this is usually blood pressure control. Follow-up with your cardiologist as well as your primary doctor.  Return to the ED with exertional chest pain, pain associate with shortness of breath, nausea, vomiting, sweating or other concerns.

## 2024-04-03 NOTE — Telephone Encounter (Signed)
 Spoke to Dr. Carita about patient who presented with non anginal CP/palpitations. Frequent PVCs, followed by Dr. Cindie as an OP. Was asx and LVEF preserved so no tx pursued. RVOT PVC (LBBB V1, V2 transition, inferior axis). Last monitor with 11% burden. No ischemic changes on ECG. If hsT rule out then sx may be related to increasing PVC burden and can revisit repeat monitor/possible tx.

## 2024-04-03 NOTE — ED Triage Notes (Signed)
 Pt. Arrives for right sided chest pain that radiates to the shoulder blades. States that its been going on for a day and a half. Denies nausea and vomiting. Also denies SOB.

## 2024-04-04 ENCOUNTER — Encounter: Payer: Self-pay | Admitting: Student

## 2024-04-04 ENCOUNTER — Ambulatory Visit: Attending: Student | Admitting: Student

## 2024-04-04 VITALS — BP 120/74 | HR 56 | Ht 67.0 in | Wt 193.0 lb

## 2024-04-04 DIAGNOSIS — I440 Atrioventricular block, first degree: Secondary | ICD-10-CM | POA: Diagnosis not present

## 2024-04-04 DIAGNOSIS — I10 Essential (primary) hypertension: Secondary | ICD-10-CM | POA: Diagnosis not present

## 2024-04-04 DIAGNOSIS — I493 Ventricular premature depolarization: Secondary | ICD-10-CM

## 2024-04-04 DIAGNOSIS — I420 Dilated cardiomyopathy: Secondary | ICD-10-CM | POA: Diagnosis not present

## 2024-04-04 NOTE — Progress Notes (Signed)
  Electrophysiology Office Note:   Date:  04/04/2024  ID:  Timothy KANDICE Suzen Mickey., DOB 02-19-1941, MRN [id number]  Primary Cardiologist: Victory LELON Claudene DOUGLAS, MD (Inactive) Electrophysiologist: OLE ONEIDA HOLTS, MD   Electrophysiologist:  OLE ONEIDA HOLTS, MD      History of Present Illness:   Timothy Hyser. is a 83 y.o. 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    Seen in ED 7/28 with chest pain. Noted to have frequent PVCs. HS troponin negative. CT negative for dissection, 4.2 thoracic aorta, and stenosis in the SMA and Renal Artery.   Patient reports doing great since then. No current complaints. He realized in the ED he had been exercising the day before and may have over-stretched his RUE. Currently, he denies chest pain, palpitations, dyspnea, PND, orthopnea, nausea, vomiting, dizziness, syncope, edema, weight gain, or early satiety.   Review of systems complete and found to be negative unless listed in HPI.   EP Information / Studies Reviewed:    EKG is not ordered today. EKG from 04/03/2024 reviewed which showed NSR at 56 bpm with occasional PVCs       Arrhythmia/Device History No specialty comments available.   Physical Exam:   VS:  BP 120/74   Pulse (!) 56   Ht 5' 7 (1.702 m)   Wt 193 lb (87.5 kg)   SpO2 95%   BMI 30.23 kg/m    Wt Readings from Last 3 Encounters:  04/04/24 193 lb (87.5 kg)  04/03/24 203 lb 12.8 oz (92.4 kg)  12/07/23 192 lb 2 oz (87.1 kg)     GEN: No acute distress NECK: No JVD; No carotid bruits CARDIAC: Regular rate and rhythm, no murmurs, rubs, gallops RESPIRATORY:  Clear to auscultation without rales, wheezing or rhonchi  ABDOMEN: Soft, non-tender, non-distended EXTREMITIES:  No edema; No deformity   ASSESSMENT AND PLAN:    PVCs 11% on monitor 07/2023 Occasional on EKG yesterday in ED On bisoprolol  5 mg daily; 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   Follow up with EP Team in 12 months  Signed, Ozell Prentice Passey, PA-C

## 2024-04-04 NOTE — Patient Instructions (Signed)
 Medication Instructions:  Your physician recommends that you continue on your current medications as directed. Please refer to the Current Medication list given to you today.  *If you need a refill on your cardiac medications before your next appointment, please call your pharmacy*  Lab Work: None ordered If you have labs (blood work) drawn today and your tests are completely normal, you will receive your results only by: MyChart Message (if you have MyChart) OR A paper copy in the mail If you have any lab test that is abnormal or we need to change your treatment, we will call you to review the results.  Follow-Up: At [facility], you and your health needs are our priority.  As part of our continuing mission to provide you with exceptional heart care, our providers are all part of one team.  This team includes your primary Cardiologist (physician) and Advanced Practice Providers or APPs (Physician Assistants and Nurse Practitioners) who all work together to provide you with the care you need, when you need it.  Your next appointment:   1 year(s)  Provider:   Ole Holts, MD or Charlies Arthur, PA-C

## 2024-04-10 ENCOUNTER — Encounter: Payer: Self-pay | Admitting: Internal Medicine

## 2024-04-10 ENCOUNTER — Ambulatory Visit (INDEPENDENT_AMBULATORY_CARE_PROVIDER_SITE_OTHER): Admitting: Internal Medicine

## 2024-04-10 VITALS — BP 126/84 | HR 54 | Temp 97.8°F | Resp 16 | Ht 67.0 in | Wt 194.0 lb

## 2024-04-10 DIAGNOSIS — R079 Chest pain, unspecified: Secondary | ICD-10-CM

## 2024-04-10 NOTE — Progress Notes (Signed)
 Subjective:    Patient ID: Timothy KANDICE Suzen Mickey., male    DOB: 1941/09/07, 83 y.o.   MRN: [id number]  DOS:  04/10/2024 Type of visit - description: ER follow-up  Went to the ER due to right-sided chest pain with some radiation posteriorly and some right upper extremity discomfort. Workup was nonacute.  Saw cardiology in follow-up, note reviewed.  At this point he is asymptomatic, again no further pain, has not developed a rash in the area.  Review of Systems See above   Past Medical History:  Diagnosis Date   Actinic keratosis    hyperplastic aktinic keratosis forehead s/p surgery Dr Anniece   Allergy    Arthritis    BPH (benign prostatic hyperplasia)    s/p TUNA   Cataract    Chronic cystitis    Dr. Devere   Diabetes mellitus without complication (HCC)    DVT (deep venous thrombosis) (HCC) 2005   no know triggers per pt   Heart murmur    HTN (hypertension)    dx in the 70s   OSA (obstructive sleep apnea)    on CPAP   Recurrent UTI    Sleep apnea    Umbilical hernia    no surgery   Vitamin D deficiency     Past Surgical History:  Procedure Laterality Date   PROSTATE SURGERY  2003   TUNA around 2003    Current Outpatient Medications  Medication Instructions   acetaminophen (TYLENOL) 1,000 mg, Daily PRN   azelastine  (ASTELIN ) 0.1 % nasal spray 2 sprays, Each Nare, 2 times daily, Use in each nostril as directed   bisoprolol -hydrochlorothiazide  (ZIAC ) 5-6.25 MG tablet 1 tablet, Oral, Daily, Take a second tablet if blood pressure if more than 140/85   Chlorphen-Pseudoephed-APAP (CORICIDIN D PO) 10 mg, As needed   doxazosin  (CARDURA ) 4 mg, Oral, Daily at bedtime   Eliquis  2.5 mg, Oral, 2 times daily   Flaxseed Oil OIL 1,400 mg, Daily   fluticasone  (FLONASE ) 50 MCG/ACT nasal spray 2 sprays, Each Nare, Daily PRN   loratadine (CLARITIN) 10 mg, Daily   OVER THE COUNTER MEDICATION 1 tablet, Daily   rosuvastatin  (CRESTOR ) 10 mg, Oral, Daily   Vitamin D3 2,000 Units,  Daily       Objective:   Physical Exam BP 126/84   Pulse (!) 54   Temp 97.8 F (36.6 C) (Oral)   Resp 16   Ht 5' 7 (1.702 m)   Wt 194 lb (88 kg)   SpO2 97%   BMI 30.38 kg/m  General:   Well developed, NAD, BMI noted. HEENT:  Normocephalic . Face symmetric, atraumatic Lungs:  CTA B Normal respiratory effort, no intercostal retractions, no accessory muscle use. Heart: RRR,  no murmur.  Lower extremities: no pretibial edema bilaterally  Skin: Not pale. Not jaundice Neurologic:  alert & oriented X3.  Speech normal, gait appropriate for age and unassisted Psych--  Cognition and judgment appear intact.  Cooperative with normal attention span and concentration.  Behavior appropriate. No anxious or depressed appearing.      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  See full report. --Echo was done, see report BPH, recurrent UTIs, chronic cystitis OSA, on CPAP Umbilical hernia Actinic keratosis; no derm eval recently as off 02/2018 Tinnitus  H/o vitamin D deficiency H/o R leg  DVT, 2005 and 2020, unprovoked, hematology Rx lifelong anticoagulant   PLAN: Chest pain: Went to the ER 04/03/2024 with chest pain.  Workup nonacute, PVCs noted. Saw cardiology 04/04/2024, felt to be stable, next visit in 1 year CP was believed to be MSK. At this point patient is asymptomatic for the last 6 days, states he is feeling great. Plan: Continue present care, follow-up already scheduled for October.

## 2024-04-10 NOTE — Assessment & Plan Note (Signed)
 Chest pain: Went to the ER 04/03/2024 with chest pain.  Workup nonacute, PVCs noted. Saw cardiology 04/04/2024, felt to be stable, next visit in 1 year CP was believed to be MSK. At this point patient is asymptomatic for the last 6 days, states he is feeling great. Plan: Continue present care, follow-up already scheduled for October.

## 2024-04-20 ENCOUNTER — Other Ambulatory Visit: Payer: Self-pay

## 2024-04-20 DIAGNOSIS — I701 Atherosclerosis of renal artery: Secondary | ICD-10-CM

## 2024-04-24 DIAGNOSIS — N281 Cyst of kidney, acquired: Secondary | ICD-10-CM | POA: Diagnosis not present

## 2024-04-24 DIAGNOSIS — R3914 Feeling of incomplete bladder emptying: Secondary | ICD-10-CM | POA: Diagnosis not present

## 2024-04-25 ENCOUNTER — Inpatient Hospital Stay: Admitting: Internal Medicine

## 2024-04-26 DIAGNOSIS — H43813 Vitreous degeneration, bilateral: Secondary | ICD-10-CM | POA: Diagnosis not present

## 2024-04-26 DIAGNOSIS — H53143 Visual discomfort, bilateral: Secondary | ICD-10-CM | POA: Diagnosis not present

## 2024-04-26 DIAGNOSIS — H35033 Hypertensive retinopathy, bilateral: Secondary | ICD-10-CM | POA: Diagnosis not present

## 2024-04-26 DIAGNOSIS — E119 Type 2 diabetes mellitus without complications: Secondary | ICD-10-CM | POA: Diagnosis not present

## 2024-04-26 LAB — HM DIABETES EYE EXAM

## 2024-05-01 ENCOUNTER — Other Ambulatory Visit: Payer: Self-pay | Admitting: Internal Medicine

## 2024-05-22 ENCOUNTER — Other Ambulatory Visit: Payer: Self-pay | Admitting: Internal Medicine

## 2024-05-29 ENCOUNTER — Ambulatory Visit: Attending: Surgery | Admitting: Surgery

## 2024-05-29 ENCOUNTER — Encounter: Payer: Self-pay | Admitting: Surgery

## 2024-05-29 ENCOUNTER — Ambulatory Visit (HOSPITAL_COMMUNITY)
Admission: RE | Admit: 2024-05-29 | Discharge: 2024-05-29 | Disposition: A | Discharge status: Home / Self Care | Source: Ambulatory Visit | Attending: Surgery | Admitting: Surgery

## 2024-05-29 ENCOUNTER — Other Ambulatory Visit: Payer: Self-pay | Admitting: Family

## 2024-05-29 VITALS — BP 168/93 | HR 55 | Temp 98.2°F | Ht 67.0 in | Wt 191.0 lb

## 2024-05-29 DIAGNOSIS — K551 Chronic vascular disorders of intestine: Secondary | ICD-10-CM

## 2024-05-29 DIAGNOSIS — I701 Atherosclerosis of renal artery: Secondary | ICD-10-CM

## 2024-05-29 NOTE — Progress Notes (Signed)
 [facility] [place]  Patient name: Timothy Stone. MRN: [id number] DOB: 11-30-1940 Sex: male   REQUESTING PROVIDER:   ER   REASON FOR CONSULT:    SMA and renal stenosis  HISTORY OF PRESENT ILLNESS:   Timothy Stone. is a 83 y.o. 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  He denies any significant weight loss.  He denies any fear of food.  His blood pressure is well-controlled.  He does not have any renal insufficiency.  The patient is diabetic.  He has a history of DVT which was unprovoked and is on anticoagulation.  He takes a statin for hypercholesterolemia.  He is a non-smoker.  PAST MEDICAL HISTORY    Past Medical History:  Diagnosis Date   Actinic keratosis    hyperplastic aktinic keratosis forehead s/p surgery Dr Anniece   Allergy    Arthritis    BPH (benign prostatic hyperplasia)    s/p TUNA   Cataract    Chronic cystitis    Dr. Devere   Diabetes mellitus without complication (HCC)    DVT (deep venous thrombosis) (HCC) 2005   no know triggers per pt   Heart murmur    HTN (hypertension)    dx in the 70s   OSA (obstructive sleep apnea)    on CPAP   Recurrent UTI    Sleep apnea    Umbilical hernia    no surgery   Vitamin D deficiency      FAMILY HISTORY   Family History  Problem Relation Age of Onset   Diabetes Mother        M, sister   Heart attack Father 41   Hearing loss Father    Breast cancer Sister    Cancer Sister    Rectal cancer Sister 2       @ time of her first Cscope   Lung cancer Brother 26       smoker    Cancer Sister    Prostate cancer Neg Hx     SOCIAL HISTORY:   Social History   Socioeconomic History   Marital status: Married    Spouse name: Not on file   Number of children: 4    Years of education: Not on file   Highest education level: Associate degree: academic program  Occupational History   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Music therapist, Art gallery manager, 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 Less than 1 per week.   Drug use: Never   Sexual activity: Yes  Other Topics Concern   Not on file  Social History Narrative   Lives w/ wife   Lost a son 04/2021         Social Drivers of Health   Financial Resource Strain: Low Risk  (04/09/2024)   Overall Financial Resource Strain (CARDIA)    Difficulty of Paying Living Expenses: Not hard at all  Food Insecurity: No Food Insecurity (04/09/2024)   Hunger Vital Sign    Worried About Running Out of Food in the Last Year: Never true    Ran  Out of Food in the Last Year: Never true  Transportation Needs: No Transportation Needs (04/09/2024)   PRAPARE - Administrator, Civil Service (Medical): No    Lack of Transportation (Non-Medical): No  Physical Activity: Sufficiently Active (04/09/2024)   Exercise Vital Sign    Days of Exercise per Week: 6 days    Minutes of Exercise per Session: 50 min  Stress: No Stress Concern Present (04/09/2024)   Timothy Stone [employer] Occupational [employer] - Occupational Stress Questionnaire    Feeling of Stress: Not at all  Social Connections: Socially Integrated (04/09/2024)   Social Connection and Isolation Panel    Frequency of Communication with Friends and Family: More than three times a week    Frequency of Social Gatherings with Friends and Family: More than three times a week    Attends Religious Services: More than 4 times per year    Active Member of [employer] or Organizations: Yes    Attends Engineer, structural: More than 4 times per year    Marital Status: Married  Catering manager Violence: Not  At Risk (07/08/2023)   Humiliation, Afraid, Rape, and Kick questionnaire    Fear of Current or Ex-Partner: No    Emotionally Abused: No    Physically Abused: No    Sexually Abused: No    ALLERGIES:    Allergies  Allergen Reactions   Hydrocodone  Shortness Of Breath   Oxycodone    Latex Hives    CURRENT MEDICATIONS:    Current Outpatient Medications  Medication Sig Dispense Refill   bisoprolol -hydrochlorothiazide  (ZIAC ) 5-6.25 MG tablet Take 1 tablet by mouth daily. Take a second tablet if blood pressure if more than 140/85 180 tablet 1   acetaminophen (TYLENOL) 500 MG tablet Take 1,000 mg by mouth daily as needed for mild pain or headache.     azelastine  (ASTELIN ) 0.1 % nasal spray Place 2 sprays into both nostrils 2 (two) times daily. Use in each nostril as directed (Patient taking differently: Place 2 sprays into both nostrils 2 (two) times daily. As needed) 30 mL 12   Cholecalciferol (VITAMIN D3) 2000 units TABS Take 2,000 Units by mouth daily.      doxazosin  (CARDURA ) 4 MG tablet Take 1 tablet (4 mg total) by mouth at bedtime. 90 tablet 1   ELIQUIS  2.5 MG TABS tablet TAKE 1 TABLET BY MOUTH TWICE  DAILY 180 tablet 3   Flaxseed Oil OIL Take 1,400 mg by mouth daily.      fluticasone  (FLONASE ) 50 MCG/ACT nasal spray Place 2 sprays into both nostrils daily as needed for allergies or rhinitis. 48 g 1   loratadine (CLARITIN) 10 MG tablet Take 10 mg by mouth daily. (Patient taking differently: Take 10 mg by mouth daily as needed.)     OVER THE COUNTER MEDICATION Take 1 tablet by mouth daily. Prostate Plus Health Complex w/ Cran-Max-Cranberry 1 daily     rosuvastatin  (CRESTOR ) 10 MG tablet Take 1 tablet (10 mg total) by mouth daily. 90 tablet 1   No current facility-administered medications for this visit.    REVIEW OF SYSTEMS:   [X]  denotes positive finding, [ ]  denotes negative finding Cardiac  Comments:  Chest pain or chest pressure:    Shortness of breath upon exertion:     Short of breath when lying flat:    Irregular heart rhythm:        Vascular    Pain in calf, thigh, or hip brought on by ambulation:  Pain in feet at night that wakes you up from your sleep:     Blood clot in your veins:    Leg swelling:         Pulmonary    Oxygen at home:    Productive cough:     Wheezing:         Neurologic    Sudden weakness in arms or legs:     Sudden numbness in arms or legs:     Sudden onset of difficulty speaking or slurred speech:    Temporary loss of vision in one eye:     Problems with dizziness:         Gastrointestinal    Blood in stool:      Vomited blood:         Genitourinary    Burning when urinating:     Blood in urine:        Psychiatric    Major depression:         Hematologic    Bleeding problems:    Problems with blood clotting too easily:        Skin    Rashes or ulcers:        Constitutional    Fever or chills:     PHYSICAL EXAM:   Vitals:   05/29/24 0946  BP: (!) 168/93  Pulse: (!) 55  Temp: 98.2 F (36.8 C)  SpO2: 97%  Weight: 191 lb (86.6 kg)  Height: 5' 7 (1.702 m)    GENERAL: The patient is a well-nourished male, in no acute distress. The vital signs are documented above. CARDIAC: There is a regular rate and rhythm.  VASCULAR: I could not palpate pedal pulses however he has palpable femoral pulses PULMONARY: Nonlabored respirations ABDOMEN: Soft and non-tender with normal pitched bowel sounds.  MUSCULOSKELETAL: There are no major deformities or cyanosis. NEUROLOGIC: No focal weakness or paresthesias are detected. SKIN: There are no ulcers or rashes noted. PSYCHIATRIC: The patient has a normal affect.  STUDIES:   I have reviewed the following CT scan: 1. Aortic and coronary artery atherosclerosis. No aortic dissection, stenosis or penetrating ulcer. 2. 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 Recommend annual imaging followup by CTA or MRA. This recommendation follows  2010 ACCF/AHA/AATS/ACR/ASA/SCA/SCAI/SIR/STS/ SVM Guidelines for the Diagnosis and Management of Patients with Thoracic Aortic Disease. Circulation. 2010; 121: Z733-z630. 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 3. 40-50% soft plaque stenosis in the proximal 1 cm of the SMA. 4. 90% or greater focal stenosis in the dominant left renal artery a few mm distal to the vessel origin. Otherwise opacifies well. 5. Cholelithiasis with equivocal pericholecystic edema. Consider ultrasound if there is diagnostic uncertainty as to possible cholecystitis. 6. Mild hepatic steatosis. 7. Mild splenomegaly. 8. Moderate to severe prostatomegaly with impression into the bladder base. 9. Umbilical and bilateral inguinal fat hernias.  ASSESSMENT and PLAN   SMA stenosis.  This is less than 50% by CT scan.  He does not endorse postprandial abdominal pain or weight loss.  I educated him about the signs and symptoms to monitor should they occur.  Left renal artery stenosis: I do not feel that this is 90% but slightly less.  He does appear to be radiographically significant however he has normal renal function and well-controlled blood pressure on minimal medications.  There is no indication to intervene on this.  He knows to contact me should he develop progressively worsening renal function or hard to control blood pressure.  Otherwise I  will see him back on an as-needed basis.   Malvina Serene CLORE, MD, FACS [facility] [phone] Pager [phone]

## 2024-06-09 ENCOUNTER — Encounter: Payer: Self-pay | Admitting: Internal Medicine

## 2024-06-09 ENCOUNTER — Ambulatory Visit: Admitting: Internal Medicine

## 2024-06-09 VITALS — BP 116/76 | HR 56 | Temp 97.7°F | Resp 16 | Ht 67.0 in | Wt 191.1 lb

## 2024-06-09 DIAGNOSIS — I10 Essential (primary) hypertension: Secondary | ICD-10-CM | POA: Diagnosis not present

## 2024-06-09 DIAGNOSIS — Z23 Encounter for immunization: Secondary | ICD-10-CM | POA: Diagnosis not present

## 2024-06-09 DIAGNOSIS — E785 Hyperlipidemia, unspecified: Secondary | ICD-10-CM | POA: Diagnosis not present

## 2024-06-09 DIAGNOSIS — E119 Type 2 diabetes mellitus without complications: Secondary | ICD-10-CM | POA: Diagnosis not present

## 2024-06-09 DIAGNOSIS — Z7901 Long term (current) use of anticoagulants: Secondary | ICD-10-CM

## 2024-06-09 DIAGNOSIS — E781 Pure hyperglyceridemia: Secondary | ICD-10-CM

## 2024-06-09 NOTE — Patient Instructions (Signed)
 GO TO THE LAB :  Get the blood work    Then, go to the front desk for the checkout Please make an appointment for a physical exam by April 2026   Continue checking your blood pressure regularly Blood pressure goal:  between 110/65 and  135/85. If it is consistently higher or lower, let me know     Please read more detailed instructions below  ESSENTIAL HYPERTENSION: Your blood pressure readings at home have been slightly elevated but are considered well-controlled. -Continue taking Ziac  and Cardura  as prescribed. -Monitor your blood pressure at home and report any significant changes.  TYPE 2 DIABETES MELLITUS, DIET-CONTROLLED: Your mild diabetes is managed with dietary modifications and does not currently require medication. -Continue with your dietary modifications to manage your blood sugar levels.  HYPERLIPIDEMIA: Your cholesterol levels need to be monitored.  We are checking your blood today.  ANTICOAGULATION: You are continuing on Eliquis  for your history of deep vein thrombosis and have no symptoms of bleeding. -Continue taking Eliquis  as prescribed. -Report any symptoms of bleeding, such as nausea, blood in stools, or blood in urine, immediately.  OBSTRUCTIVE SLEEP APNEA: You are managing your obstructive sleep apnea well with regular use of your CPAP machine. -Continue using your CPAP machine regularly.  IMMUNIZATION COUNSELING AND RECOMMENDATIONS: We discussed the importance of receiving the COVID booster and flu shot to prevent severe illness and hospitalization.

## 2024-06-09 NOTE — Progress Notes (Signed)
 Subjective:    Patient ID: Timothy KANDICE Suzen Mickey., male    DOB: 1941-07-17, 83 y.o.   MRN: [id number]  DOS:  06/09/2024 Follow-up History of Present Illness Covid-19 immunization and infection history - Received annual COVID-19 booster without adverse effects - History of mild COVID-19 infection once - History of another COVID-19 infection early in the pandemic  Anticoagulation therapy - Takes Eliquis  for deep vein thrombosis - No nausea, hematochezia, or hematuria  Hypertension management - Takes Ziac  and Cardura  for blood pressure control - Systolic blood pressure at home typically in the 120s, occasionally rising to the 130s - No chest pain, palpitations, or lower extremity edema  Glycemic control - Mild diabetes managed with dietary modifications  Obstructive sleep apnea - Uses CPAP machine regularly  Recent laboratory findings - Recent blood work, including potassium and blood cell counts, was normal   Review of Systems See above   Past Medical History:  Diagnosis Date   Actinic keratosis    hyperplastic aktinic keratosis forehead s/p surgery Dr Anniece   Allergy    Arthritis    BPH (benign prostatic hyperplasia)    s/p TUNA   Cataract    Chronic cystitis    Dr. Devere   Diabetes mellitus without complication (HCC)    DVT (deep venous thrombosis) (HCC) 2005   no know triggers per pt   Heart murmur    HTN (hypertension)    dx in the 70s   OSA (obstructive sleep apnea)    on CPAP   Recurrent UTI    Sleep apnea    Umbilical hernia    no surgery   Vitamin D deficiency     Past Surgical History:  Procedure Laterality Date   PROSTATE SURGERY  2003   TUNA around 2003    Current Outpatient Medications  Medication Instructions   acetaminophen (TYLENOL) 1,000 mg, Daily PRN   Azelastine  HCl 137 MCG/SPRAY SOLN PLACE 2 SPRAYS INTO BOTH NOSTRILS 2 (TWO) TIMES DAILY. USE IN EACH NOSTRIL AS DIRECTED   bisoprolol -hydrochlorothiazide  (ZIAC ) 5-6.25 MG tablet  Take 1 tablet by mouth daily. Take a second tablet if blood pressure if more than 140/85   doxazosin  (CARDURA ) 4 mg, Oral, Daily at bedtime   Eliquis  2.5 mg, Oral, 2 times daily   Flaxseed Oil OIL 1,400 mg, Daily   fluticasone  (FLONASE ) 50 MCG/ACT nasal spray 2 sprays, Each Nare, Daily PRN   loratadine (CLARITIN) 10 mg, Daily   OVER THE COUNTER MEDICATION 1 tablet, Daily   rosuvastatin  (CRESTOR ) 10 mg, Oral, Daily   Vitamin D3 2,000 Units, Daily       Objective:   Physical Exam BP 116/76   Pulse (!) 56   Temp 97.7 F (36.5 C) (Oral)   Resp 16   Ht 5' 7 (1.702 m)   Wt 191 lb 2 oz (86.7 kg)   SpO2 97%   BMI 29.93 kg/m  General:   Well developed, NAD, BMI noted. HEENT:  Normocephalic . Face symmetric, atraumatic Lungs:  CTA B Normal respiratory effort, no intercostal retractions, no accessory muscle use. Heart: RRR,  no murmur.  DM foot exam: No edema, good pedal pulses, pinprick examination normal Neurologic:  alert & oriented X3.  Speech normal, gait appropriate for age and unassisted Psych--  Cognition and judgment appear intact.  Cooperative with normal attention span and concentration.  Behavior appropriate. No anxious or depressed appearing.      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  See full report. --Echo was done, see report BPH, recurrent UTIs, chronic cystitis OSA, on CPAP Umbilical hernia Actinic keratosis; no derm eval recently as off 02/2018 Tinnitus  H/o vitamin D deficiency  Assessment & Plan Essential hypertension Blood pressure readings at home  in the 120s, occasionally higher. This is considered well-controlled.  Continue Ziac , Cardura , last BMP okay.  Continue  monitoring. DM: Diet control, feet exam negative, check A1c Hyperlipidemia On rosuvastatin , last LFTs okay.  Check FLP Anticoagulant for life Continues on Eliquis  for DVT history. No symptoms of bleeding such as nausea, blood in stools, or blood in urine reported. Obstructive sleep apnea Regular use of CPAP machine reported, Immunization counseling and recommendations Flu shot today Discussed the importance of receiving the COVID booster and flu shot.  Explained that the COVID booster can prevent severe illness Emphasized that the benefits outweigh the risks, with no significant downside noted. RTC 12-2024 CPX

## 2024-06-10 DIAGNOSIS — Z7901 Long term (current) use of anticoagulants: Secondary | ICD-10-CM | POA: Insufficient documentation

## 2024-06-10 LAB — HEMOGLOBIN A1C
Hgb A1c MFr Bld: 6.4 % — ABNORMAL HIGH (ref <5.7)
Mean Plasma Glucose: 137 mg/dL
eAG (mmol/L): 7.6 mmol/L

## 2024-06-10 LAB — LIPID PANEL
Cholesterol: 90 mg/dL (ref <200)
HDL: 32 mg/dL — ABNORMAL LOW (ref >=40)
LDL Cholesterol (Calc): 36 mg/dL
Non-HDL Cholesterol (Calc): 58 mg/dL (ref <130)
Total CHOL/HDL Ratio: 2.8 (calc) (ref <5.0)
Triglycerides: 134 mg/dL (ref <150)

## 2024-06-10 NOTE — Assessment & Plan Note (Signed)
 Essential hypertension Blood pressure readings at home  in the 120s, occasionally higher. This is considered well-controlled.  Continue Ziac , Cardura , last BMP okay.  Continue monitoring. DM: Diet control, feet exam negative, check A1c Hyperlipidemia On rosuvastatin , last LFTs okay.  Check FLP Anticoagulant for life Continues on Eliquis  for DVT history. No symptoms of bleeding such as nausea, blood in stools, or blood in urine reported. Obstructive sleep apnea Regular use of CPAP machine reported, Immunization counseling and recommendations Flu shot today Discussed the importance of receiving the COVID booster and flu shot.  Explained that the COVID booster can prevent severe illness Emphasized that the benefits outweigh the risks, with no significant downside noted. RTC 12-2024 CPX

## 2024-06-12 ENCOUNTER — Encounter: Payer: Self-pay | Admitting: Internal Medicine

## 2024-06-12 ENCOUNTER — Other Ambulatory Visit: Payer: Self-pay | Admitting: Internal Medicine

## 2024-06-12 ENCOUNTER — Ambulatory Visit: Payer: Self-pay | Admitting: Internal Medicine

## 2024-06-29 ENCOUNTER — Ambulatory Visit (HOSPITAL_BASED_OUTPATIENT_CLINIC_OR_DEPARTMENT_OTHER): Admitting: Pulmonary Disease

## 2024-06-29 ENCOUNTER — Encounter (HOSPITAL_BASED_OUTPATIENT_CLINIC_OR_DEPARTMENT_OTHER): Payer: Self-pay | Admitting: Pulmonary Disease

## 2024-06-29 VITALS — BP 131/78 | HR 57 | Ht 67.0 in | Wt 194.0 lb

## 2024-06-29 DIAGNOSIS — G4733 Obstructive sleep apnea (adult) (pediatric): Secondary | ICD-10-CM | POA: Diagnosis not present

## 2024-06-29 NOTE — Progress Notes (Signed)
 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  History of Present Illness Timothy Stone. is an 83 year old male with severe obstructive sleep apnea who presents for reestablishment of care. He was referred by Dr. Amon for follow-up on his sleep apnea management.  He was last evaluated in 2018 for obstructive sleep apnea, with a home sleep test showing an apnea-hypopnea index of 50 events per hour. He uses CPAP therapy consistently, achieving a perfect score of 100 on his machine. Recently, events increased to 10-15 per hour over the past three nights due to loosening the mask because of a facial wound affecting the seal. He experiences discomfort from the mask pressing on the wound.  He reports good sleep quality overall, with a bedtime between 10:30 and 11:30 PM, and waking up at 7 AM. He takes naps in the afternoon out of habit. He denies excessive daytime sleepiness and maintains an active lifestyle, going to the gym daily for an hour.  He wakes up congested and takes a couple of hours to clear his head in the morning. He uses the humidifier feature on his CPAP machine. He has a history of blood clots and is on blood thinners. He has experienced extra heartbeats since childhood but denies any history of heart attacks.   [id number]   Significant tests/ events reviewed HST 05/2017 showed severe OSA with 52       ROS  Daytime sonolence  + Congestion +  Allergies: Hydrocodone , Oxycodone, and Latex  Current Outpatient Medications:    acetaminophen (TYLENOL) 500 MG tablet, Take 1,000 mg by mouth daily as needed for mild pain or headache., Disp: , Rfl:    Azelastine  HCl 137 MCG/SPRAY SOLN, PLACE 2 SPRAYS INTO BOTH NOSTRILS 2 (TWO) TIMES DAILY. USE IN EACH NOSTRIL AS DIRECTED, Disp: 30 mL, Rfl: 1   bisoprolol -hydrochlorothiazide  (ZIAC ) 5-6.25 MG tablet, Take 1 tablet by mouth daily. Take a second tablet if blood pressure if more than 140/85, Disp: 180 tablet, Rfl: 1   Cholecalciferol (VITAMIN D3) 2000 units TABS, Take 2,000 Units by mouth daily. , Disp: , Rfl:    doxazosin  (CARDURA ) 4 MG tablet, Take 1 tablet (4 mg total) by mouth at bedtime., Disp: 90 tablet, Rfl: 1   ELIQUIS  2.5 MG TABS tablet, TAKE 1 TABLET BY MOUTH TWICE  DAILY, Disp: 180 tablet, Rfl: 3   Flaxseed Oil OIL, Take 1,400 mg by mouth daily. , Disp: , Rfl:    fluticasone  (FLONASE ) 50 MCG/ACT nasal spray, Place 2 sprays into both nostrils daily as needed for allergies or rhinitis., Disp: 48 g, Rfl: 1   loratadine (CLARITIN) 10 MG tablet, Take 10 mg by mouth daily. (Patient taking differently: Take 10 mg by mouth daily as needed.), Disp: , Rfl:    OVER THE COUNTER MEDICATION, Take 1 tablet by mouth  daily. Prostate Plus Health Complex w/ Cran-Max-Cranberry 1 daily, Disp: , Rfl:    rosuvastatin  (CRESTOR ) 10 MG tablet, Take 1 tablet (10 mg total) by mouth daily., Disp: 90 tablet, Rfl: 1 Past Medical History:  Diagnosis Date   Actinic keratosis    hyperplastic aktinic keratosis forehead s/p surgery Dr Anniece   Allergy    Arthritis    BPH (benign prostatic hyperplasia)    s/p TUNA   Cataract    Chronic cystitis    Dr. Devere   Diabetes mellitus without complication (HCC)    DVT (deep venous thrombosis) (HCC) 2005   no know triggers per pt   Heart murmur    HTN (hypertension)    dx in the 70s   OSA (obstructive sleep apnea)    on CPAP   Recurrent UTI     Sleep apnea    Umbilical hernia    no surgery   Vitamin D deficiency    Past Surgical History:  Procedure Laterality Date   PROSTATE SURGERY  2003   TUNA around 2003   Family History  Problem Relation Age of Onset   Diabetes Mother        M, sister   Heart attack Father 59   Hearing loss Father    Breast cancer Sister    Cancer Sister    Rectal cancer Sister 71       @ time of her first Cscope   Lung cancer Brother 63       smoker    Cancer Sister    Prostate cancer Neg Hx    Social History   Socioeconomic History   Marital status: Married    Spouse name: Not on file   Number of children: 4   Years of education: Not on file   Highest education level: Associate degree: academic program  Occupational History   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Music therapist, Art gallery manager, 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 Less than 1 per week.   Drug use: Never   Sexual activity: Yes  Other Topics Concern   Not on file  Social History Narrative   Lives w/ wife   Lost a son 04/2021         Social Drivers of Health   Financial Resource Strain: Low Risk  (04/09/2024)   Overall Financial Resource Strain (CARDIA)    Difficulty of Paying Living Expenses: Not hard at all  Food Insecurity: No Food Insecurity (04/09/2024)   Hunger Vital Sign    Worried About Running Out of Food in the Last Year: Never true    Ran Out of Food in the Last Year: Never true  Transportation Needs: No Transportation Needs (04/09/2024)   PRAPARE - Administrator, Civil Service (Medical): No    Lack of Transportation (Non-Medical): No  Physical Activity: Sufficiently Active (04/09/2024)   Exercise Vital Sign    Days of Exercise per Week: 6 days    Minutes of Exercise per Session: 50 min  Stress: No Stress Concern Present  (04/09/2024)   [employer] of Occupational Health - Occupational Stress Questionnaire    Feeling of Stress: Not at all  Social Connections: Socially Integrated (04/09/2024)   Social Connection and Isolation Panel    Frequency of Communication with Friends and Family:  More than three times a week    Frequency of Social Gatherings with Friends and Family: More than three times a week    Attends Religious Services: More than 4 times per year    Active Member of Clubs or Organizations: Yes    Attends Banker Meetings: More than 4 times per year    Marital Status: Married  Catering manager Violence: Not At Risk (07/08/2023)   Humiliation, Afraid, Rape, and Kick questionnaire    Fear of Current or Ex-Partner: No    Emotionally Abused: No    Physically Abused: No    Sexually Abused: No       Objective:  BP 131/78   Pulse (!) 57   Ht 5' 7 (1.702 m)   Wt 194 lb (88 kg)   SpO2 95%   BMI 30.38 kg/m    Physical Exam  Gen. Pleasant, obese,elderly, in no distress ENT - no lesions, no post nasal drip Neck: No JVD, no thyromegaly, no carotid bruits Lungs: no use of accessory muscles, no dullness to percussion, decreased without rales or rhonchi  Cardiovascular: Rhythm regular, heart sounds  normal, no murmurs or gallops, no peripheral edema Musculoskeletal: No deformities, no cyanosis or clubbing , no tremors       Assessment & Plan:  Assessment and Plan Assessment & Plan Obstructive sleep apnea with residual hypersomnolence Severe obstructive sleep apnea with 50 apneic events per hour, currently using CPAP with good compliance. Recent increase in events to 10-15 per hour over the last three nights, possibly due to a facial wound affecting mask fit. Residual hypersomnolence present, but reports good daytime energy and regular exercise. Not a candidate for stimulants  - Adjust CPAP pressure settings to auto settings 8 to 15 cm H2O. - Provide a new mask (AirFit F30) to  alleviate pressure on the nasolabial fold. - Schedule yearly follow-up appointments to monitor and replace CPAP machine as needed. - Communicate with Dr. Amon regarding current management plan.  Congestion on awakening Reports waking up congested, requiring a couple of hours to clear. Uncertain if CPAP use contributes to this symptom. Uses humidification with CPAP. - Continue using humidification with CPAP.       No follow-ups on file.   Timothy Stone Jude, MD

## 2024-06-29 NOTE — Patient Instructions (Addendum)
 X Airfit F30 full face mask X change to auto 8-15 cm  VISIT SUMMARY: Today, you were seen for a follow-up on your severe obstructive sleep apnea. You have been using your CPAP machine consistently, but recently experienced an increase in events due to a facial wound affecting the mask seal. We discussed your sleep quality, daily routine, and overall health.  YOUR PLAN: -OBSTRUCTIVE SLEEP APNEA WITH RESIDUAL HYPERSOMNOLENCE: Obstructive sleep apnea is a condition where your airway becomes blocked during sleep, causing breathing pauses. You have been experiencing 50 events per hour, but recently this increased to 10-15 events per hour due to a facial wound affecting your mask fit. We will adjust your CPAP pressure settings to auto settings between 8 to 15 cm H2O and provide you with a new mask (AirFit F30) to reduce pressure on the affected area. We will also schedule yearly follow-up appointments to monitor and replace your CPAP machine as needed. We will communicate with Dr. Amon regarding your current management plan.  -CONGESTION ON AWAKENING: You have been waking up congested and it takes a couple of hours to clear your head. This may be related to your CPAP use. Continue using the humidification feature on your CPAP machine to help with this symptom.                        Contains text generated by Abridge.                                 Contains text generated by Abridge.

## 2024-06-29 NOTE — Addendum Note (Signed)
 Addended by: ALINDA WINN KIDD on: 06/29/2024 04:25 PM   Modules accepted: Orders

## 2024-06-29 NOTE — Progress Notes (Signed)
 Epworth Sleepiness Scale  Use the following scale to choose the most appropriate number for each situation. 0 Would never nod off 1  Slight  chance of nodding off 2 Moderate chance of nodding off 3 High chance of nodding off  Sitting and reading: 3 Watching TV: 3 Sitting, inactive, in a public place (e.g., in a meeting, theater, or dinner event): 1 As a passenger in a car for an hour or more without stopping for a break: 2 Lying down to rest when circumstances permit:3 Sitting and talking to someone: 0 Sitting quietly after a meal without alcohol: 2 In a car, while stopped for a few  minutes in traffic or at a light: 0  TOTOAL: 14

## 2024-07-11 ENCOUNTER — Ambulatory Visit: Payer: Medicare Other | Admitting: *Deleted

## 2024-07-11 VITALS — BP 134/69 | HR 60 | Temp 98.6°F | Resp 18 | Ht 67.0 in | Wt 194.6 lb

## 2024-07-11 DIAGNOSIS — Z0000 Encounter for general adult medical examination without abnormal findings: Secondary | ICD-10-CM | POA: Diagnosis not present

## 2024-07-11 NOTE — Progress Notes (Signed)
 Please attest this visit in the absence of patient primary care provider.    Subjective:   Timothy Stone. is a 83 y.o. male who presents for a Medicare Annual Wellness Visit.  Allergies (verified) Hydrocodone , Oxycodone, and Latex   History: Past Medical History:  Diagnosis Date   Actinic keratosis    hyperplastic aktinic keratosis forehead s/p surgery Dr Anniece   Allergy    Arthritis    BPH (benign prostatic hyperplasia)    s/p TUNA   Cataract    Chronic cystitis    Dr. Devere   Diabetes mellitus without complication (HCC)    DVT (deep venous thrombosis) (HCC) 2005   no know triggers per pt   Heart murmur    HTN (hypertension)    dx in the 70s   OSA (obstructive sleep apnea)    on CPAP   Recurrent UTI    Sleep apnea    Umbilical hernia    no surgery   Vitamin D deficiency    Past Surgical History:  Procedure Laterality Date   PROSTATE SURGERY  2003   TUNA around 2003   Family History  Problem Relation Age of Onset   Diabetes Mother        M, sister   Heart attack Father 78   Hearing loss Father    Breast cancer Sister    Cancer Sister    Rectal cancer Sister 27       @ time of her first Cscope   Lung cancer Brother 9       smoker    Cancer Sister    Prostate cancer Neg Hx    Social History   Occupational History   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music therapist, art gallery manager, 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 Less than 1 per week.   Drug use: Never   Sexual activity: Yes   Tobacco Counseling Counseling given: Not Answered  SDOH Screenings   Food Insecurity: No Food Insecurity (07/11/2024)  Housing: Low Risk  (07/11/2024)  Transportation Needs: No Transportation Needs (07/11/2024)  Utilities: Not At Risk (07/11/2024)  Alcohol Screen: Low Risk  (04/09/2024)   Depression (PHQ2-9): Low Risk  (06/09/2024)  Financial Resource Strain: Low Risk  (04/09/2024)  Physical Activity: Sufficiently Active (07/11/2024)  Social Connections: Socially Integrated (07/11/2024)  Stress: No Stress Concern Present (07/11/2024)  Tobacco Use: Low Risk  (07/11/2024)  Health Literacy: Adequate Health Literacy (07/11/2024)   Depression Screen    06/09/2024    1:15 PM 04/10/2024    3:09 PM 12/07/2023   12:55 PM 07/08/2023   11:08 AM 06/16/2023    1:01 PM 04/07/2023    2:42 PM 11/25/2022    2:46 PM  PHQ 2/9 Scores  PHQ - 2 Score 0 0 0 0 0 0 0  PHQ- 9 Score 0         Pt reports no changes since 06/09/24   Goals Addressed               This Visit's Progress     Patient Stated (pt-stated)   Not on track     Lose 15 lbs.      Patient Stated   On track     Maintain health active lifestyle.  Visit info / Clinical Intake: Medicare Wellness Visit Type:: Subsequent Annual Wellness Visit Medicare Wellness Visit Mode:: In-person (required for WTM) Interpreter Needed?: No Pre-visit prep was completed: yes AWV questionnaire completed by patient prior to visit?: yes Date:: 07/10/24 Living arrangements:: lives with spouse/significant other Patient's Overall Health Status Rating: very good Typical amount of pain: some (when first rising in the morning. fine after stretching) Does pain affect daily life?: no Are you currently prescribed opioids?: no  Dietary Habits and Nutritional Risks How many meals a day?: (!) 1 (drinks protein shake mid morning, eats meal in the evening.) Eats fruit and vegetables daily?: yes Most meals are obtained by: preparing own meals; eating out Diabetic:: (!) yes Any non-healing wounds?: no How often do you check your BS?: 0 (only when sees doctor) Would you like to be referred to a Nutritionist or for Diabetic Management? : no  Functional Status Activities of Daily Living (to include ambulation/medication): (Patient-Rptd)  Independent Ambulation: (Patient-Rptd) Independent Medication Administration: Independent Home Management: (Patient-Rptd) Independent Manage your own finances?: yes Primary transportation is: driving Concerns about vision?: no *vision screening is required for WTM* Concerns about hearing?: no  Fall Screening Falls in the past year?: (Patient-Rptd) 0 Number of falls in past year: (Patient-Rptd) 0 Was there an injury with Fall?: (Patient-Rptd) 0 Fall Risk Category Calculator: (Patient-Rptd) 0 Patient Fall Risk Level: (Patient-Rptd) Low Fall Risk  Fall Risk Patient at Risk for Falls Due to: No Fall Risks Fall risk Follow up: Falls evaluation completed  Home and Transportation Safety: All rugs have non-skid backing?: yes All stairs or steps have railings?: yes Grab bars in the bathtub or shower?: yes Have non-skid surface in bathtub or shower?: (!) no Good home lighting?: yes Regular seat belt use?: yes Hospital stays in the last year:: (!) yes How many hospital stays:: -- (ER visit and negative cardiac workup)  Cognitive Assessment Difficulty concentrating, remembering, or making decisions? : no (forgets names but usually can recall them after thinking on it) Will 6CIT or Mini Cog be Completed: yes What year is it?: 0 points What month is it?: 0 points Give patient an address phrase to remember (5 components): [address] [place] About what time is it?: 0 points Count backwards from 20 to 1: 0 points Say the months of the year in reverse: 0 points Repeat the address phrase from earlier: 6 points 6 CIT Score: 6 points  Advance Directives (For Healthcare) Does Patient Have a Medical Advance Directive?: No Would patient like information on creating a medical advance directive?: No - Patient declined        Objective:    Today's Vitals   07/11/24 1309  BP: 134/69  Pulse: 60  Resp: 18  Temp: 98.6 F (37 C)  TempSrc: Oral  SpO2: 96%  Weight: 194 lb 9.6 oz (88.3  kg)  Height: 5' 7 (1.702 m)   Body mass index is 30.48 kg/m.  Current Medications (verified) Outpatient Encounter Medications as of 07/11/2024  Medication Sig   acetaminophen (TYLENOL) 500 MG tablet Take 1,000 mg by mouth daily as needed for mild pain or headache.   Azelastine  HCl 137 MCG/SPRAY SOLN PLACE 2 SPRAYS INTO BOTH NOSTRILS 2 (TWO) TIMES DAILY. USE IN EACH NOSTRIL AS DIRECTED   bisoprolol -hydrochlorothiazide  (ZIAC ) 5-6.25 MG tablet Take 1 tablet by mouth daily. Take a second tablet if blood pressure if more than 140/85   Cholecalciferol (VITAMIN D3) 2000 units TABS Take 2,000 Units by mouth daily.    doxazosin  (  CARDURA ) 4 MG tablet Take 1 tablet (4 mg total) by mouth at bedtime.   ELIQUIS  2.5 MG TABS tablet TAKE 1 TABLET BY MOUTH TWICE  DAILY   Flaxseed Oil OIL Take 1,400 mg by mouth daily.    fluticasone  (FLONASE ) 50 MCG/ACT nasal spray Place 2 sprays into both nostrils daily as needed for allergies or rhinitis.   loratadine (CLARITIN) 10 MG tablet Take 10 mg by mouth daily. (Patient taking differently: Take 10 mg by mouth daily as needed.)   OVER THE COUNTER MEDICATION Take 1 tablet by mouth daily. Prostate Plus Health Complex w/ Cran-Max-Cranberry 1 daily   rosuvastatin  (CRESTOR ) 10 MG tablet Take 1 tablet (10 mg total) by mouth daily.   No facility-administered encounter medications on file as of 07/11/2024.   Hearing/Vision screen Hearing Screening - Comments:: Notes some decrease with certain tones. Vision Screening - Comments:: Up to date with routine eye exams with Ginnie Pinal. Immunizations and Health Maintenance Health Maintenance  Topic Date Due   DTaP/Tdap/Td (3 - Tdap) 02/17/2024   Diabetic kidney evaluation - Urine ACR  12/06/2024   HEMOGLOBIN A1C  12/08/2024   COVID-19 Vaccine (9 - Pfizer risk 2025-26 season) 12/11/2024   Diabetic kidney evaluation - eGFR measurement  04/03/2025   OPHTHALMOLOGY EXAM  04/26/2025   FOOT EXAM  06/09/2025   Medicare Annual  Wellness (AWV)  07/11/2025   Colonoscopy  08/25/2025   Pneumococcal Vaccine: 50+ Years  Completed   Influenza Vaccine  Completed   Zoster Vaccines- Shingrix   Completed   Meningococcal B Vaccine  Aged Out        Assessment/Plan:  This is a routine wellness examination for Timothy Stone.  Patient Care Team: Amon Aloysius BRAVO, MD as PCP - General Claudene Victory ORN, MD (Inactive) as PCP - Cardiology (Cardiology) Cindie Ole DASEN, MD as PCP - Electrophysiology (Cardiology) Devere Lonni Righter, MD as Consulting Physician (Urology) Rubin Calamity, MD as Consulting Physician (General Surgery) Pinal Ginnie, OD as Consulting Physician (Optometry) Georgina Alm PHEBE Raddle., MD as Consulting Physician (Otolaryngology) Carla Milling, RPH-CPP (Pharmacist)  I have personally reviewed and noted the following in the patient's chart:   Medical and social history Use of alcohol, tobacco or illicit drugs  Current medications and supplements including opioid prescriptions. Functional ability and status Nutritional status Physical activity Advanced directives List of other physicians Hospitalizations, surgeries, and ER visits in previous 12 months Vitals Screenings to include cognitive, depression, and falls Referrals and appointments  No orders of the defined types were placed in this encounter.  In addition, I have reviewed and discussed with patient certain preventive protocols, quality metrics, and best practice recommendations. A written personalized care plan for preventive services as well as general preventive health recommendations were provided to patient.   Lolita Libra, CMA   07/11/2024   Return in 1 year (on 07/11/2025).  After Visit Summary: (In Person-Printed) AVS printed and given to the patient  Nurse Notes: nothing significant to report

## 2024-07-11 NOTE — Patient Instructions (Addendum)
 Timothy Stone,  Thank you for taking the time for your Medicare Wellness Visit. I appreciate your continued commitment to your health goals. Please review the care plan we discussed, and feel free to reach out if I can assist you further.  Please note that Annual Wellness Visits do not include a physical exam. Some assessments may be limited, especially if the visit was conducted virtually. If needed, we may recommend an in-person follow-up with your provider.  Ongoing Care Seeing your primary care provider every 3 to 6 months helps us  monitor your health and provide consistent, personalized care.   Dr Amon:  12/11/24 2:20pm Annual Wellness Visit: 07/13/25 1pm  Recommended Screenings:  You will need to get the following vaccines at your local pharmacy: tdap  Health Maintenance  Topic Date Due   DTaP/Tdap/Td vaccine (3 - Tdap) 02/17/2024   Medicare Annual Wellness Visit  07/07/2024   Yearly kidney health urinalysis for diabetes  12/06/2024   Hemoglobin A1C  12/08/2024   COVID-19 Vaccine (9 - Pfizer risk 2025-26 season) 12/11/2024   Yearly kidney function blood test for diabetes  04/03/2025   Eye exam for diabetics  04/26/2025   Complete foot exam   06/09/2025   Colon Cancer Screening  08/25/2025   Pneumococcal Vaccine for age over 62  Completed   Flu Shot  Completed   Zoster (Shingles) Vaccine  Completed   Meningitis B Vaccine  Aged Out       04/03/2024   12:26 AM  Advanced Directives  Does Patient Have a Medical Advance Directive? No  Would patient like information on creating a medical advance directive? No - Patient declined  Once completed and notarized, you may return a copy of your Advanced Directive(s) by either of the following: Bring a copy of your health care power of attorney and living will to the office to be added to your chart at your convenience. You can mail a copy to [facility] [address] [place], [place] [address] or email to  [email or web address]   Vision: Annual vision screenings are recommended for early detection of glaucoma, cataracts, and diabetic retinopathy. These exams can also reveal signs of chronic conditions such as diabetes and high blood pressure.  Dental: Annual dental screenings help detect early signs of oral cancer, gum disease, and other conditions linked to overall health, including heart disease and diabetes.  Please see the attached documents for additional preventive care recommendations.

## 2024-08-11 ENCOUNTER — Other Ambulatory Visit: Payer: Self-pay | Admitting: Hematology & Oncology

## 2024-08-11 DIAGNOSIS — I824Z1 Acute embolism and thrombosis of unspecified deep veins of right distal lower extremity: Secondary | ICD-10-CM

## 2024-08-15 NOTE — Progress Notes (Unsigned)
 [facility] [address], [address] [place], [place] [address] [phone]  Date:  08/17/2024   Name:  Timothy Stone.   DOB:  [birth year]   MRN:  [id number]  PCP:  Amon Aloysius BRAVO, MD    Chief Complaint: No chief complaint on file.   History of Present Illness:  Timothy Mcdanel. is a 83 y.o. very pleasant male patient who presents with the following:  Primary patient Timothy Stone Dr. Amon seen today with lower back pain. History of diabetes, hypertension, sleep apnea, DVT He is on Eliquis  2.5 twice daily  Discussed the use of AI scribe software for clinical note transcription with the patient, who gave verbal consent to proceed.  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   DJD (degenerative joint disease) 06/23/2022   DVT (deep venous thrombosis) (HCC) 08/21/2019   PCP NOTES >>>>>>>>>>>>>>>>>>>> 02/12/2016   Umbilical hernia 11/01/2012   Diabetes mellitus without complication (HCC) 01/20/2012   Annual physical exam 08/10/2011   BPH (benign prostatic hyperplasia)    Recurrent UTI    Dyslipidemia 11/16/2008   Obstructive sleep apnea 03/07/2008   Essential hypertension 02/21/2007   Allergic rhinitis due to allergen 02/21/2007    Past Medical History:  Diagnosis Date   Actinic keratosis    hyperplastic aktinic keratosis forehead s/p surgery Dr Anniece   Allergy    Arthritis    BPH (benign prostatic hyperplasia)    s/p TUNA   Cataract    Chronic cystitis    Dr. Devere   Diabetes mellitus without complication (HCC)    DVT (deep venous thrombosis) (HCC) 2005   no know triggers per pt   Heart murmur    HTN (hypertension)    dx in the 70s   OSA (obstructive sleep apnea)    on CPAP   Recurrent UTI    Sleep apnea    Umbilical hernia    no surgery   Vitamin D deficiency     Past Surgical History:   Procedure Laterality Date   PROSTATE SURGERY  2003   TUNA around 2003    Social History   Tobacco Use   Smoking status: Never   Smokeless tobacco: Never  Vaping Use   Vaping status: Never Used  Substance Use Topics   Alcohol use: Yes    Comment: Limited use. Less than 1 per week.   Drug use: Never    Family History  Problem Relation Age of Onset   Diabetes Mother        M, sister   Heart attack Father 81   Hearing loss Father    Breast cancer Sister    Cancer Sister    Rectal cancer Sister 62       @ time of her first Cscope   Lung cancer Brother 86       smoker    Cancer Sister    Prostate cancer Neg Hx     Allergies  Allergen Reactions   Hydrocodone  Shortness Of Breath   Oxycodone    Latex Hives    Medication list has been reviewed and updated.  Current Outpatient Medications on File Prior to Visit  Medication Sig Dispense Refill   acetaminophen (TYLENOL) 500 MG tablet Take 1,000 mg by mouth daily as needed for mild pain or headache.  Azelastine  HCl 137 MCG/SPRAY SOLN PLACE 2 SPRAYS INTO BOTH NOSTRILS 2 (TWO) TIMES DAILY. USE IN EACH NOSTRIL AS DIRECTED 30 mL 1   bisoprolol -hydrochlorothiazide  (ZIAC ) 5-6.25 MG tablet Take 1 tablet by mouth daily. Take a second tablet if blood pressure if more than 140/85 180 tablet 1   Cholecalciferol (VITAMIN D3) 2000 units TABS Take 2,000 Units by mouth daily.      doxazosin  (CARDURA ) 4 MG tablet Take 1 tablet (4 mg total) by mouth at bedtime. 90 tablet 1   ELIQUIS  2.5 MG TABS tablet TAKE 1 TABLET BY MOUTH TWICE  DAILY 180 tablet 3   Flaxseed Oil OIL Take 1,400 mg by mouth daily.      fluticasone  (FLONASE ) 50 MCG/ACT nasal spray Place 2 sprays into both nostrils daily as needed for allergies or rhinitis. 48 g 1   loratadine (CLARITIN) 10 MG tablet Take 10 mg by mouth daily. (Patient taking differently: Take 10 mg by mouth daily as needed.)     OVER THE COUNTER MEDICATION Take 1 tablet by mouth daily. Prostate Plus Health  Complex w/ Cran-Max-Cranberry 1 daily     rosuvastatin  (CRESTOR ) 10 MG tablet Take 1 tablet (10 mg total) by mouth daily. 90 tablet 1   No current facility-administered medications on file prior to visit.    Review of Systems:  ***  Physical Examination: There were no vitals filed for this visit. There were no vitals filed for this visit. There is no height or weight on file to calculate BMI. Ideal Body Weight:    ***  Assessment and Plan: No diagnosis found.  Assessment & Plan   Signed Harlene Schroeder, MD

## 2024-08-17 ENCOUNTER — Encounter: Payer: Self-pay | Admitting: Family Medicine

## 2024-08-17 ENCOUNTER — Ambulatory Visit: Admitting: Family Medicine

## 2024-08-17 VITALS — BP 122/72 | HR 78 | Temp 97.9°F | Resp 18 | Ht 67.0 in | Wt 191.4 lb

## 2024-08-17 DIAGNOSIS — S39012A Strain of muscle, fascia and tendon of lower back, initial encounter: Secondary | ICD-10-CM

## 2024-08-17 NOTE — Patient Instructions (Signed)
 It was good to see you today- it seems like your back is getting better.  Ok to take tylenol before bed if you like If your back does not continue to get better over the next few days please send me a message over mychart!

## 2024-09-19 ENCOUNTER — Other Ambulatory Visit: Payer: Self-pay | Admitting: Internal Medicine

## 2024-12-11 ENCOUNTER — Encounter: Admitting: Internal Medicine

## 2025-07-13 ENCOUNTER — Ambulatory Visit
